# Patient Record
Sex: Female | Born: 1960
Health system: Southern US, Community
[De-identification: ages and names within clinical notes are randomized; demographics above are authoritative.]

## PROBLEM LIST (undated history)

## (undated) DIAGNOSIS — I7300 Raynaud's syndrome without gangrene: Secondary | ICD-10-CM

## (undated) DIAGNOSIS — E079 Disorder of thyroid, unspecified: Secondary | ICD-10-CM

## (undated) DIAGNOSIS — T7840XA Allergy, unspecified, initial encounter: Secondary | ICD-10-CM

## (undated) DIAGNOSIS — K5792 Diverticulitis of intestine, part unspecified, without perforation or abscess without bleeding: Secondary | ICD-10-CM

## (undated) DIAGNOSIS — R7303 Prediabetes: Secondary | ICD-10-CM

## (undated) DIAGNOSIS — K219 Gastro-esophageal reflux disease without esophagitis: Secondary | ICD-10-CM

## (undated) DIAGNOSIS — E785 Hyperlipidemia, unspecified: Secondary | ICD-10-CM

## (undated) DIAGNOSIS — R0989 Other specified symptoms and signs involving the circulatory and respiratory systems: Secondary | ICD-10-CM

## (undated) DIAGNOSIS — A6920 Lyme disease, unspecified: Secondary | ICD-10-CM

## (undated) DIAGNOSIS — E559 Vitamin D deficiency, unspecified: Secondary | ICD-10-CM

## (undated) HISTORY — DX: Prediabetes: R73.03

## (undated) HISTORY — DX: Disorder of thyroid, unspecified: E07.9

## (undated) HISTORY — DX: Raynaud's syndrome without gangrene: I73.00

## (undated) HISTORY — DX: Other specified symptoms and signs involving the circulatory and respiratory systems: R09.89

## (undated) HISTORY — DX: Vitamin D deficiency, unspecified: E55.9

## (undated) HISTORY — DX: Lyme disease, unspecified: A69.20

## (undated) HISTORY — DX: Allergy, unspecified, initial encounter: T78.40XA

## (undated) HISTORY — DX: Hyperlipidemia, unspecified: E78.5

## (undated) HISTORY — DX: Diverticulitis of intestine, part unspecified, without perforation or abscess without bleeding: K57.92

## (undated) HISTORY — DX: Gastro-esophageal reflux disease without esophagitis: K21.9

## (undated) HISTORY — PX: COLONOSCOPY: SHX174

## (undated) HISTORY — PX: TONSILLECTOMY: SUR1361

---

## 1999-06-13 ENCOUNTER — Emergency Department (HOSPITAL_COMMUNITY): Admission: EM | Admit: 1999-06-13 | Discharge: 1999-06-13 | Payer: Self-pay | Admitting: Emergency Medicine

## 1999-06-14 ENCOUNTER — Encounter: Payer: Self-pay | Admitting: Emergency Medicine

## 2000-10-16 ENCOUNTER — Other Ambulatory Visit: Admission: RE | Admit: 2000-10-16 | Discharge: 2000-10-16 | Payer: Self-pay | Admitting: Obstetrics and Gynecology

## 2001-08-29 ENCOUNTER — Emergency Department (HOSPITAL_COMMUNITY): Admission: EM | Admit: 2001-08-29 | Discharge: 2001-08-29 | Payer: Self-pay | Admitting: Emergency Medicine

## 2001-12-03 ENCOUNTER — Other Ambulatory Visit: Admission: RE | Admit: 2001-12-03 | Discharge: 2001-12-03 | Payer: Self-pay | Admitting: Obstetrics and Gynecology

## 2003-03-03 ENCOUNTER — Ambulatory Visit (HOSPITAL_COMMUNITY): Admission: RE | Admit: 2003-03-03 | Discharge: 2003-03-03 | Payer: Self-pay | Admitting: Internal Medicine

## 2003-03-03 ENCOUNTER — Encounter: Payer: Self-pay | Admitting: Internal Medicine

## 2004-03-04 ENCOUNTER — Ambulatory Visit (HOSPITAL_COMMUNITY): Admission: RE | Admit: 2004-03-04 | Discharge: 2004-03-04 | Payer: Self-pay | Admitting: Obstetrics and Gynecology

## 2005-03-05 ENCOUNTER — Ambulatory Visit (HOSPITAL_COMMUNITY): Admission: RE | Admit: 2005-03-05 | Discharge: 2005-03-05 | Payer: Self-pay | Admitting: Internal Medicine

## 2006-03-10 ENCOUNTER — Ambulatory Visit (HOSPITAL_COMMUNITY): Admission: RE | Admit: 2006-03-10 | Discharge: 2006-03-10 | Payer: Self-pay | Admitting: Internal Medicine

## 2006-11-10 ENCOUNTER — Ambulatory Visit (HOSPITAL_COMMUNITY): Admission: RE | Admit: 2006-11-10 | Discharge: 2006-11-10 | Payer: Self-pay | Admitting: Internal Medicine

## 2006-11-25 ENCOUNTER — Ambulatory Visit (HOSPITAL_COMMUNITY): Admission: RE | Admit: 2006-11-25 | Discharge: 2006-11-25 | Payer: Self-pay | Admitting: Internal Medicine

## 2007-03-12 ENCOUNTER — Ambulatory Visit (HOSPITAL_COMMUNITY): Admission: RE | Admit: 2007-03-12 | Discharge: 2007-03-12 | Payer: Self-pay | Admitting: Internal Medicine

## 2008-03-14 ENCOUNTER — Ambulatory Visit (HOSPITAL_COMMUNITY): Admission: RE | Admit: 2008-03-14 | Discharge: 2008-03-14 | Payer: Self-pay | Admitting: Internal Medicine

## 2009-03-15 ENCOUNTER — Ambulatory Visit (HOSPITAL_COMMUNITY): Admission: RE | Admit: 2009-03-15 | Discharge: 2009-03-15 | Payer: Self-pay | Admitting: Internal Medicine

## 2010-03-18 ENCOUNTER — Ambulatory Visit (HOSPITAL_COMMUNITY): Admission: RE | Admit: 2010-03-18 | Discharge: 2010-03-18 | Payer: Self-pay | Admitting: Internal Medicine

## 2011-01-05 ENCOUNTER — Encounter: Payer: Self-pay | Admitting: Internal Medicine

## 2011-02-17 ENCOUNTER — Other Ambulatory Visit (HOSPITAL_COMMUNITY): Payer: Self-pay | Admitting: Internal Medicine

## 2011-02-17 DIAGNOSIS — Z1231 Encounter for screening mammogram for malignant neoplasm of breast: Secondary | ICD-10-CM

## 2011-03-25 ENCOUNTER — Ambulatory Visit (HOSPITAL_COMMUNITY)
Admission: RE | Admit: 2011-03-25 | Discharge: 2011-03-25 | Disposition: A | Discharge status: Home / Self Care | Payer: BC Managed Care – PPO | Source: Ambulatory Visit | Attending: Internal Medicine | Admitting: Internal Medicine

## 2011-03-25 DIAGNOSIS — Z1231 Encounter for screening mammogram for malignant neoplasm of breast: Secondary | ICD-10-CM

## 2012-02-18 ENCOUNTER — Other Ambulatory Visit (HOSPITAL_COMMUNITY): Payer: Self-pay | Admitting: Internal Medicine

## 2012-02-18 DIAGNOSIS — Z1231 Encounter for screening mammogram for malignant neoplasm of breast: Secondary | ICD-10-CM

## 2012-03-30 ENCOUNTER — Ambulatory Visit (HOSPITAL_COMMUNITY)
Admission: RE | Admit: 2012-03-30 | Discharge: 2012-03-30 | Disposition: A | Discharge status: Home / Self Care | Payer: BC Managed Care – PPO | Source: Ambulatory Visit | Attending: Internal Medicine | Admitting: Internal Medicine

## 2012-03-30 DIAGNOSIS — Z1231 Encounter for screening mammogram for malignant neoplasm of breast: Secondary | ICD-10-CM | POA: Insufficient documentation

## 2012-04-12 ENCOUNTER — Encounter: Payer: Self-pay | Admitting: Gastroenterology

## 2012-05-14 ENCOUNTER — Ambulatory Visit (AMBULATORY_SURGERY_CENTER): Payer: BC Managed Care – PPO | Admitting: *Deleted

## 2012-05-14 VITALS — Ht 64.5 in | Wt 140.0 lb

## 2012-05-14 DIAGNOSIS — Z1211 Encounter for screening for malignant neoplasm of colon: Secondary | ICD-10-CM

## 2012-05-14 MED ORDER — PEG-KCL-NACL-NASULF-NA ASC-C 100 G PO SOLR: ORAL | Status: DC

## 2012-05-28 ENCOUNTER — Encounter: Payer: Self-pay | Admitting: Gastroenterology

## 2012-05-28 ENCOUNTER — Ambulatory Visit (AMBULATORY_SURGERY_CENTER): Payer: BC Managed Care – PPO | Admitting: Gastroenterology

## 2012-05-28 VITALS — BP 159/90 | HR 95 | Temp 96.4°F | Resp 16 | Ht 64.5 in | Wt 140.0 lb

## 2012-05-28 DIAGNOSIS — K5730 Diverticulosis of large intestine without perforation or abscess without bleeding: Secondary | ICD-10-CM

## 2012-05-28 DIAGNOSIS — Z1211 Encounter for screening for malignant neoplasm of colon: Secondary | ICD-10-CM

## 2012-05-28 MED ORDER — SODIUM CHLORIDE 0.9 % IV SOLN: 500.0000 mL | INTRAVENOUS | Status: DC

## 2012-05-28 NOTE — Op Note (Signed)
[facility] [address] [place], [place]  [address]  COLONOSCOPY PROCEDURE REPORT  PATIENT:  Beth Miranda, Beth Miranda  MR#:  [id number] BIRTHDATE:  05/18/1961, 51 yrs. old  GENDER:  female ENDOSCOPIST:  Barbette Hair. Arlyce Dice, MD REF. BY:  Lucky Cowboy, M.D. PROCEDURE DATE:  05/28/2012 PROCEDURE:  Diagnostic Colonoscopy ASA CLASS:  Class II INDICATIONS:  Routine Risk Screening MEDICATIONS:   MAC sedation, administered by CRNA propofol 200mg IV  DESCRIPTION OF PROCEDURE:   After the risks benefits and alternatives of the procedure were thoroughly explained, informed consent was obtained.  Digital rectal exam was performed and revealed no abnormalities.   The LB CF-[id number] endoscope was introduced through the anus and advanced to the cecum, which was identified by both the appendix and ileocecal valve, without limitations.  The quality of the prep was excellent, using MoviPrep.  The instrument was then slowly withdrawn as the colon was fully examined. <<PROCEDUREIMAGES>>  FINDINGS:  Diverticula were found in the proximal transverse colon (see image2). Few diverticula  This was otherwise a normal examination of the colon (see image1 and image3).   Retroflexed views in the rectum revealed no abnormalities.    The time to cecum =  1) 4.25  minutes. The scope was then withdrawn in  1) 5.50  minutes from the cecum and the procedure completed. COMPLICATIONS:  None ENDOSCOPIC IMPRESSION: 1) Diverticula in the proximal transverse colon 2) Otherwise normal examination RECOMMENDATIONS: 1) Continue current colorectal screening recommendations for "routine risk" patients with a repeat colonoscopy in 10 years. REPEAT EXAM:  In 10 year(s) for Colonoscopy.  ______________________________ Barbette Hair. Arlyce Dice, MD  CC:  n. eSIGNED:   Barbette Hair. Anyely Cunning at 05/28/2012 12:21 PM  [employer], [phone]

## 2012-05-28 NOTE — Progress Notes (Signed)
Patient did not experience any of the following events: a burn prior to discharge; a fall within the facility; wrong site/side/patient/procedure/implant event; or a hospital transfer or hospital admission upon discharge from the facility. (G8907) Patient did not have preoperative order for IV antibiotic SSI prophylaxis. (G8918)  

## 2012-05-28 NOTE — Patient Instructions (Addendum)
Normal colonoscopy  Recall colonoscopy in 10 years   YOU HAD AN ENDOSCOPIC PROCEDURE TODAY AT THE [facility]: Refer to the procedure report that was given to you for any specific questions about what was found during the examination.  If the procedure report does not answer your questions, please call your gastroenterologist to clarify.  If you requested that your care partner not be given the details of your procedure findings, then the procedure report has been included in a sealed envelope for you to review at your convenience later.  YOU SHOULD EXPECT: Some feelings of bloating in the abdomen. Passage of more gas than usual.  Walking can help get rid of the air that was put into your GI tract during the procedure and reduce the bloating. If you had a lower endoscopy (such as a colonoscopy or flexible sigmoidoscopy) you may notice spotting of blood in your stool or on the toilet paper. If you underwent a bowel prep for your procedure, then you may not have a normal bowel movement for a few days.  DIET: Your first meal following the procedure should be a light meal and then it is ok to progress to your normal diet.  A half-sandwich or bowl of soup is an example of a good first meal.  Heavy or fried foods are harder to digest and may make you feel nauseous or bloated.  Likewise meals heavy in dairy and vegetables can cause extra gas to form and this can also increase the bloating.  Drink plenty of fluids but you should avoid alcoholic beverages for 24 hours.  ACTIVITY: Your care partner should take you home directly after the procedure.  You should plan to take it easy, moving slowly for the rest of the day.  You can resume normal activity the day after the procedure however you should NOT DRIVE or use heavy machinery for 24 hours (because of the sedation medicines used during the test).    SYMPTOMS TO REPORT IMMEDIATELY: A gastroenterologist can be reached at any hour.  During normal  business hours, 8:30 AM to 5:00 PM Monday through Friday, call [phone].  After hours and on weekends, please call the GI answering service at [phone] who will take a message and have the physician on call contact you.   Following lower endoscopy (colonoscopy or flexible sigmoidoscopy):  Excessive amounts of blood in the stool  Significant tenderness or worsening of abdominal pains  Swelling of the abdomen that is new, acute  Fever of 100F or higher  Following upper endoscopy (EGD)  Vomiting of blood or coffee ground material  New chest pain or pain under the shoulder blades  Painful or persistently difficult swallowing  New shortness of breath  Fever of 100F or higher  Black, tarry-looking stools  FOLLOW UP: If any biopsies were taken you will be contacted by phone or by letter within the next 1-3 weeks.  Call your gastroenterologist if you have not heard about the biopsies in 3 weeks.  Our staff will call the home number listed on your records the next business day following your procedure to check on you and address any questions or concerns that you may have at that time regarding the information given to you following your procedure. This is a courtesy call and so if there is no answer at the home number and we have not heard from you through the emergency physician on call, we will assume that you have returned to your regular  daily activities without incident.  SIGNATURES/CONFIDENTIALITY: You and/or your care partner have signed paperwork which will be entered into your electronic medical record.  These signatures attest to the fact that that the information above on your After Visit Summary has been reviewed and is understood.  Full responsibility of the confidentiality of this discharge information lies with you and/or your care-partner. YOU HAD AN ENDOSCOPIC PROCEDURE TODAY AT THE [facility]: Refer to the procedure report that was given to you for any  specific questions about what was found during the examination.  If the procedure report does not answer your questions, please call your gastroenterologist to clarify.  If you requested that your care partner not be given the details of your procedure findings, then the procedure report has been included in a sealed envelope for you to review at your convenience later.  YOU SHOULD EXPECT: Some feelings of bloating in the abdomen. Passage of more gas than usual.  Walking can help get rid of the air that was put into your GI tract during the procedure and reduce the bloating. If you had a lower endoscopy (such as a colonoscopy or flexible sigmoidoscopy) you may notice spotting of blood in your stool or on the toilet paper. If you underwent a bowel prep for your procedure, then you may not have a normal bowel movement for a few days.  DIET: Your first meal following the procedure should be a light meal and then it is ok to progress to your normal diet.  A half-sandwich or bowl of soup is an example of a good first meal.  Heavy or fried foods are harder to digest and may make you feel nauseous or bloated.  Likewise meals heavy in dairy and vegetables can cause extra gas to form and this can also increase the bloating.  Drink plenty of fluids but you should avoid alcoholic beverages for 24 hours.  ACTIVITY: Your care partner should take you home directly after the procedure.  You should plan to take it easy, moving slowly for the rest of the day.  You can resume normal activity the day after the procedure however you should NOT DRIVE or use heavy machinery for 24 hours (because of the sedation medicines used during the test).    SYMPTOMS TO REPORT IMMEDIATELY: A gastroenterologist can be reached at any hour.  During normal business hours, 8:30 AM to 5:00 PM Monday through Friday, call [phone].  After hours and on weekends, please call the GI answering service at [phone] who will take a message and  have the physician on call contact you.   Following lower endoscopy (colonoscopy or flexible sigmoidoscopy):  Excessive amounts of blood in the stool  Significant tenderness or worsening of abdominal pains  Swelling of the abdomen that is new, acute  Fever of 100F or higher  FOLLOW UP: If any biopsies were taken you will be contacted by phone or by letter within the next 1-3 weeks.  Call your gastroenterologist if you have not heard about the biopsies in 3 weeks.  Our staff will call the home number listed on your records the next business day following your procedure to check on you and address any questions or concerns that you may have at that time regarding the information given to you following your procedure. This is a courtesy call and so if there is no answer at the home number and we have not heard from you through the emergency physician on call, we  will assume that you have returned to your regular daily activities without incident.  SIGNATURES/CONFIDENTIALITY: You and/or your care partner have signed paperwork which will be entered into your electronic medical record.  These signatures attest to the fact that that the information above on your After Visit Summary has been reviewed and is understood.  Full responsibility of the confidentiality of this discharge information lies with you and/or your care-partner.

## 2012-05-31 ENCOUNTER — Telehealth: Payer: Self-pay

## 2012-05-31 NOTE — Telephone Encounter (Signed)
Left message on answering machine. 

## 2013-02-28 ENCOUNTER — Other Ambulatory Visit (HOSPITAL_COMMUNITY): Payer: Self-pay | Admitting: Internal Medicine

## 2013-02-28 DIAGNOSIS — Z1231 Encounter for screening mammogram for malignant neoplasm of breast: Secondary | ICD-10-CM

## 2013-03-31 ENCOUNTER — Ambulatory Visit (HOSPITAL_COMMUNITY)
Admission: RE | Admit: 2013-03-31 | Discharge: 2013-03-31 | Disposition: A | Discharge status: Home / Self Care | Payer: BC Managed Care – PPO | Source: Ambulatory Visit | Attending: Internal Medicine | Admitting: Internal Medicine

## 2013-03-31 DIAGNOSIS — Z1231 Encounter for screening mammogram for malignant neoplasm of breast: Secondary | ICD-10-CM | POA: Insufficient documentation

## 2013-12-06 ENCOUNTER — Encounter: Payer: Self-pay | Admitting: Physician Assistant

## 2013-12-06 ENCOUNTER — Ambulatory Visit (INDEPENDENT_AMBULATORY_CARE_PROVIDER_SITE_OTHER): Payer: No Typology Code available for payment source | Admitting: Physician Assistant

## 2013-12-06 VITALS — BP 110/70 | HR 88 | Temp 100.4°F | Resp 16 | Ht 64.5 in | Wt 137.0 lb

## 2013-12-06 DIAGNOSIS — J029 Acute pharyngitis, unspecified: Secondary | ICD-10-CM

## 2013-12-06 LAB — POCT RAPID STREP A (OFFICE): Rapid Strep A Screen: NEGATIVE

## 2013-12-06 MED ORDER — PROMETHAZINE-CODEINE 6.25-10 MG/5ML PO SYRP: 5.0000 mL | ORAL_SOLUTION | Freq: Four times a day (QID) | ORAL | Status: DC | PRN

## 2013-12-06 MED ORDER — PREDNISONE 20 MG PO TABS: ORAL_TABLET | ORAL | Status: DC

## 2013-12-06 MED ORDER — AZITHROMYCIN 250 MG PO TABS: 250.0000 mg | ORAL_TABLET | Freq: Every day | ORAL | Status: DC

## 2013-12-06 NOTE — Patient Instructions (Signed)

## 2013-12-06 NOTE — Progress Notes (Signed)
   Subjective:    Patient ID: Beth Miranda, female    DOB: [birth year], 52 y.o.   MRN: [id number]  URI  This is a new problem. The current episode started in the past 7 days. The problem has been gradually worsening. The maximum temperature recorded prior to her arrival was 100 - 100.9 F. The fever has been present for 1 to 2 days. Associated symptoms include ear pain, a plugged ear sensation, sinus pain, a sore throat and swollen glands. Pertinent negatives include no abdominal pain, chest pain, congestion, coughing, diarrhea, dysuria, headaches, joint pain, joint swelling, nausea, neck pain, rash, rhinorrhea, sneezing, vomiting or wheezing. She has tried nothing for the symptoms.    Review of Systems  Constitutional: Positive for fever, chills and fatigue.  HENT: Positive for ear pain and sore throat. Negative for congestion, rhinorrhea and sneezing.   Eyes: Negative.   Respiratory: Negative.  Negative for cough and wheezing.   Cardiovascular: Negative.  Negative for chest pain.  Gastrointestinal: Negative.  Negative for nausea, vomiting, abdominal pain and diarrhea.  Genitourinary: Negative.  Negative for dysuria.  Musculoskeletal: Negative.  Negative for joint pain and neck pain.  Skin: Negative.  Negative for rash.  Neurological: Negative for headaches.       Objective:   Physical Exam  Constitutional: She appears well-developed and well-nourished.  HENT:  Head: Normocephalic and atraumatic.  Right Ear: External ear normal.  Left Ear: External ear normal.  Mouth/Throat: Uvula is midline and mucous membranes are normal. Posterior oropharyngeal edema and posterior oropharyngeal erythema present.  Eyes: Conjunctivae and EOM are normal. Pupils are equal, round, and reactive to light.  Neck: Normal range of motion. Neck supple.  Cardiovascular: Normal rate, regular rhythm and normal heart sounds.   Pulmonary/Chest: Effort normal and breath sounds normal.  Abdominal: Soft. Bowel  sounds are normal.  Lymphadenopathy:    She has cervical adenopathy.  Skin: Skin is warm and dry.       Assessment & Plan:  Acute pharyngitis - Plan: azithromycin (ZITHROMAX) 250 MG tablet, predniSONE (DELTASONE) 20 MG tablet, promethazine-codeine (PHENERGAN WITH CODEINE) 6.25-10 MG/5ML syrup, POCT rapid strep A- negative

## 2014-04-24 ENCOUNTER — Other Ambulatory Visit: Payer: Self-pay | Admitting: Internal Medicine

## 2014-04-24 MED ORDER — LEVOTHYROXINE SODIUM 50 MCG PO TABS: 50.0000 ug | ORAL_TABLET | Freq: Every day | ORAL | Status: DC

## 2014-04-25 ENCOUNTER — Encounter: Payer: Self-pay | Admitting: Internal Medicine

## 2014-05-03 ENCOUNTER — Other Ambulatory Visit: Payer: Self-pay | Admitting: *Deleted

## 2014-05-03 MED ORDER — RANITIDINE HCL 300 MG PO TABS: 300.0000 mg | ORAL_TABLET | Freq: Every day | ORAL | Status: DC

## 2014-05-08 DIAGNOSIS — I7300 Raynaud's syndrome without gangrene: Secondary | ICD-10-CM | POA: Insufficient documentation

## 2014-05-08 DIAGNOSIS — R0989 Other specified symptoms and signs involving the circulatory and respiratory systems: Secondary | ICD-10-CM | POA: Insufficient documentation

## 2014-05-08 DIAGNOSIS — E782 Mixed hyperlipidemia: Secondary | ICD-10-CM | POA: Insufficient documentation

## 2014-05-08 DIAGNOSIS — R7309 Other abnormal glucose: Secondary | ICD-10-CM | POA: Insufficient documentation

## 2014-05-08 DIAGNOSIS — K219 Gastro-esophageal reflux disease without esophagitis: Secondary | ICD-10-CM | POA: Insufficient documentation

## 2014-05-08 DIAGNOSIS — E559 Vitamin D deficiency, unspecified: Secondary | ICD-10-CM | POA: Insufficient documentation

## 2014-05-09 ENCOUNTER — Encounter: Payer: Self-pay | Admitting: Internal Medicine

## 2014-05-09 ENCOUNTER — Ambulatory Visit (INDEPENDENT_AMBULATORY_CARE_PROVIDER_SITE_OTHER): Payer: No Typology Code available for payment source | Admitting: Internal Medicine

## 2014-05-09 VITALS — BP 104/76 | HR 76 | Temp 97.7°F | Resp 16 | Ht 64.25 in | Wt 135.8 lb

## 2014-05-09 DIAGNOSIS — E559 Vitamin D deficiency, unspecified: Secondary | ICD-10-CM

## 2014-05-09 DIAGNOSIS — R7401 Elevation of levels of liver transaminase levels: Secondary | ICD-10-CM

## 2014-05-09 DIAGNOSIS — R7402 Elevation of levels of lactic acid dehydrogenase (LDH): Secondary | ICD-10-CM

## 2014-05-09 DIAGNOSIS — Z113 Encounter for screening for infections with a predominantly sexual mode of transmission: Secondary | ICD-10-CM

## 2014-05-09 DIAGNOSIS — Z111 Encounter for screening for respiratory tuberculosis: Secondary | ICD-10-CM

## 2014-05-09 DIAGNOSIS — R0989 Other specified symptoms and signs involving the circulatory and respiratory systems: Secondary | ICD-10-CM

## 2014-05-09 DIAGNOSIS — Z1212 Encounter for screening for malignant neoplasm of rectum: Secondary | ICD-10-CM

## 2014-05-09 DIAGNOSIS — R740 Nonspecific elevation of levels of transaminase and lactic acid dehydrogenase [LDH]: Secondary | ICD-10-CM

## 2014-05-09 DIAGNOSIS — Z0000 Encounter for general adult medical examination without abnormal findings: Secondary | ICD-10-CM

## 2014-05-09 DIAGNOSIS — I10 Essential (primary) hypertension: Secondary | ICD-10-CM

## 2014-05-09 DIAGNOSIS — E039 Hypothyroidism, unspecified: Secondary | ICD-10-CM | POA: Insufficient documentation

## 2014-05-09 LAB — CBC WITH DIFFERENTIAL/PLATELET
Basophils Absolute: 0 10*3/uL (ref 0.0–0.1)
Basophils Relative: 1 % (ref 0–1)
Eosinophils Absolute: 0 10*3/uL (ref 0.0–0.7)
Eosinophils Relative: 1 % (ref 0–5)
HCT: 38.6 % (ref 36.0–46.0)
Hemoglobin: 13.1 g/dL (ref 12.0–15.0)
Lymphocytes Relative: 41 % (ref 12–46)
Lymphs Abs: 1.6 10*3/uL (ref 0.7–4.0)
MCH: 29.3 pg (ref 26.0–34.0)
MCHC: 33.9 g/dL (ref 30.0–36.0)
MCV: 86.4 fL (ref 78.0–100.0)
Monocytes Absolute: 0.3 10*3/uL (ref 0.1–1.0)
Monocytes Relative: 8 % (ref 3–12)
Neutro Abs: 2 10*3/uL (ref 1.7–7.7)
Neutrophils Relative %: 49 % (ref 43–77)
Platelets: 230 10*3/uL (ref 150–400)
RBC: 4.47 MIL/uL (ref 3.87–5.11)
RDW: 13.4 % (ref 11.5–15.5)
WBC: 4 10*3/uL (ref 4.0–10.5)

## 2014-05-09 NOTE — Progress Notes (Signed)
Patient ID: Beth Miranda, female   DOB: 1961-03-07, 53 y.o.   MRN: [id number]   Annual Screening Comprehensive Examination  This very nice 53 y.o. MWF presents for complete physical.  Patient has been followed for HTN, Prediabetes, Hyperlipidemia, and Vitamin D Deficiency.    Patient has Hx/o labile HTN which has been monitored expectantly.  Patient's BP has been controlled and today's BP: 104/76 mmHg. Patient denies any cardiac symptoms as chest pain, palpitations, shortness of breath, dizziness or ankle swelling.   Patient's hyperlipidemia is controlled with diet and medications. Patient denies myalgias or other medication SE's. Last cholesterol last visit was 187, triglycerides 70, HDL 70 and LDL 103 in Oct 2014.     Patient has prediabetes with A1c 5.9%  predating since 2012 and last A1c was 5.8% in Oct 2014. Patient denies reactive hypoglycemic symptoms, visual blurring, diabetic polys, or paresthesias.    Finally, patient has history of Vitamin D Deficiency and last vitamin D was 76 in Oct 2014.  Medication Sig  . aspirin 81 MG tablet Take 81 mg by mouth daily.  . Cetirizine HCl  Take 10 mg by mouth at bedtime.  Marland Kitchen VITAMIN D 2000 UNITS t Take 2,000 Units by mouth 2 (two) times daily.  Marland Kitchen VITAMIN B 12 PO Take 1,000 mcg by mouth daily.  Marland Kitchen estradiol-norethindrone (ACTIVELLA) 0.5/0.1 mg Take 1 tablet by mouth at bedtime.  . fish oil 1000 MG cap Take 1,200 mg by mouth 2 (two) times daily.  . hyoscyamine  0.15 MG  Take by mouth daily.  . Levothyroxine 50 MCG tablet Take 1 tablet (50 mcg total) by mouth daily.  . Magnesium 200 MG TABS Take 200 mg by mouth 2 (two) times daily.  Marland Kitchen omeprazole  40 MG cap Take 40 mg by mouth daily.  . ranitidine  300 MG tablet Take 1 tablet (300 mg total) by mouth at bedtime.  . Red Yeast Rice Extract  Take 1,200 mg by mouth daily.   Allergies  Allergen Reactions  . Lamisil [Terbinafine Hcl] Hives  . Penicillins Hives  . Sulfa Antibiotics Hives   Past  Medical History  Diagnosis Date  . Raynaud's disease   . Labile hypertension   . GERD (gastroesophageal reflux disease)   . Thyroid disease     hypothyroid  . Allergy     seasonal  . Hyperlipidemia   . Prediabetes   . Vitamin D deficiency    Past Surgical History  Procedure Laterality Date  . Tonsillectomy     Family History  Problem Relation Age of Onset  . Pancreatic cancer Paternal Grandmother   . Arthritis Mother   . COPD Mother   . Asthma Mother   . Hypertension Father   . Diabetes Father   . Kidney disease Father     Stones   History  Substance Use Topics  . Smoking status: Never Smoker   . Smokeless tobacco: Never Used  . Alcohol Use: 0.5 oz/week    1 drink(s) per week    ROS Constitutional: Denies fever, chills, weight loss/gain, headaches, insomnia, fatigue, night sweats, and change in appetite. Eyes: Denies redness, blurred vision, diplopia, discharge, itchy, watery eyes.  ENT: Denies discharge, congestion, post nasal drip, epistaxis, sore throat, earache, hearing loss, dental pain, Tinnitus, Vertigo, Sinus pain, snoring.  Cardio: Denies chest pain, palpitations, irregular heartbeat, syncope, dyspnea, diaphoresis, orthopnea, PND, claudication, edema Respiratory: denies cough, dyspnea, DOE, pleurisy, hoarseness, laryngitis, wheezing.  Gastrointestinal: Denies dysphagia, heartburn, reflux, water brash, pain,  cramps, nausea, vomiting, bloating, diarrhea, constipation, hematemesis, melena, hematochezia, jaundice, hemorrhoids Genitourinary: Denies dysuria, frequency, urgency, nocturia, hesitancy, discharge, hematuria, flank pain Breast:Breast lumps, nipple discharge, bleeding.  Musculoskeletal: Denies arthralgia, myalgia, stiffness, Jt. Swelling, pain, limp, and strain/sprain. Skin: Denies puritis, rash, hives, warts, acne, eczema, changing in skin lesion Neuro: No weakness, tremor, incoordination, spasms, paresthesia, pain Psychiatric: Denies confusion, memory  loss, sensory loss Endocrine: Denies change in weight, skin, hair change, nocturia, and paresthesia, diabetic polys, visual blurring, hyper / hypo glycemic episodes.  Heme/Lymph: No excessive bleeding, bruising, enlarged lymph nodes.   Physical Exam  BP 104/76  Pulse 76  Temp 97.7 F   Resp 16  Ht 5' 4.25"   Wt 135 lb 12.8 oz   BMI 23.13 kg/m2  General Appearance: Well nourished, in no apparent distress. Eyes: PERRLA, EOMs, conjunctiva no swelling or erythema, normal fundi and vessels. Sinuses: No frontal/maxillary tenderness ENT/Mouth: EACs patent / TMs  nl. Nares clear without erythema, swelling, mucoid exudates. Oral hygiene is good. No erythema, swelling, or exudate. Tongue normal, non-obstructing. Tonsils not swollen or erythematous. Hearing normal.  Neck: Supple, thyroid normal. No bruits, nodes or JVD. Respiratory: Respiratory effort normal.  BS equal and clear bilateral without rales, rhonci, wheezing or stridor. Cardio: Heart sounds are normal with regular rate and rhythm and no murmurs, rubs or gallops. Peripheral pulses are normal and equal bilaterally without edema. No aortic or femoral bruits. Chest: symmetric with normal excursions and percussion. Breasts: Symmetric, without lumps, nipple discharge, retractions, or fibrocystic changes.  Abdomen: Flat, soft, with bowl sounds. Nontender, no guarding, rebound, hernias, masses, or organomegaly.  Lymphatics: Non tender without lymphadenopathy.  Musculoskeletal: Full ROM all peripheral extremities, joint stability, 5/5 strength, and normal gait. Skin: Warm and dry without rashes, lesions, cyanosis, clubbing or  ecchymosis.  Neuro: Cranial nerves intact, reflexes equal bilaterally. Normal muscle tone, no cerebellar symptoms. Sensation intact.  Pysch: Awake and oriented X 3, normal affect, Insight and Judgment appropriate.   Assessment and Plan  1. Annual Screening Examination 2. Hypertension, Labile 3. Headaches, Mixed 4.  Pre Diabetes 5. Hypothyroidism 6. Hyperlipidemia 7. Raynaud's 8. MCTD Syndrome , remission  Continue prudent diet as discussed, weight control, BP monitoring, regular exercise, and medications. Discussed med's effects and SE's. Screening labs and tests as requested with regular follow-up as recommended.

## 2014-05-09 NOTE — Patient Instructions (Signed)

## 2014-05-10 LAB — IRON AND TIBC
%SAT: 23 % (ref 20–55)
Iron: 69 ug/dL (ref 42–145)
TIBC: 294 ug/dL (ref 250–470)
UIBC: 225 ug/dL (ref 125–400)

## 2014-05-10 LAB — HEMOGLOBIN A1C
Hgb A1c MFr Bld: 5.4 % (ref <5.7)
Mean Plasma Glucose: 108 mg/dL (ref <117)

## 2014-05-10 LAB — LIPID PANEL
Cholesterol: 157 mg/dL (ref 0–200)
HDL: 59 mg/dL (ref >39)
LDL Cholesterol: 89 mg/dL (ref 0–99)
Total CHOL/HDL Ratio: 2.7 Ratio
Triglycerides: 43 mg/dL (ref <150)
VLDL: 9 mg/dL (ref 0–40)

## 2014-05-10 LAB — MAGNESIUM: Magnesium: 2.2 mg/dL (ref 1.5–2.5)

## 2014-05-10 LAB — URINALYSIS, MICROSCOPIC ONLY
Bacteria, UA: NONE SEEN
Casts: NONE SEEN
Crystals: NONE SEEN
Squamous Epithelial / LPF: NONE SEEN

## 2014-05-10 LAB — VITAMIN D 25 HYDROXY (VIT D DEFICIENCY, FRACTURES): Vit D, 25-Hydroxy: 68 ng/mL (ref 30–89)

## 2014-05-10 LAB — HEPATIC FUNCTION PANEL
ALT: 9 U/L (ref 0–35)
AST: 17 U/L (ref 0–37)
Albumin: 4.4 g/dL (ref 3.5–5.2)
Alkaline Phosphatase: 28 U/L — ABNORMAL LOW (ref 39–117)
Bilirubin, Direct: 0.1 mg/dL (ref 0.0–0.3)
Indirect Bilirubin: 0.3 mg/dL (ref 0.2–1.2)
Total Bilirubin: 0.4 mg/dL (ref 0.2–1.2)
Total Protein: 6.8 g/dL (ref 6.0–8.3)

## 2014-05-10 LAB — BASIC METABOLIC PANEL WITH GFR
BUN: 13 mg/dL (ref 6–23)
CO2: 28 mEq/L (ref 19–32)
Calcium: 9.2 mg/dL (ref 8.4–10.5)
Chloride: 106 mEq/L (ref 96–112)
Creat: 0.88 mg/dL (ref 0.50–1.10)
GFR, Est African American: 87 mL/min
GFR, Est Non African American: 76 mL/min
Glucose, Bld: 81 mg/dL (ref 70–99)
Potassium: 4.6 mEq/L (ref 3.5–5.3)
Sodium: 142 mEq/L (ref 135–145)

## 2014-05-10 LAB — MICROALBUMIN / CREATININE URINE RATIO
Creatinine, Urine: 27 mg/dL
Microalb Creat Ratio: 18.5 mg/g (ref 0.0–30.0)
Microalb, Ur: 0.5 mg/dL (ref 0.00–1.89)

## 2014-05-10 LAB — HEPATITIS A ANTIBODY, TOTAL: Hep A Total Ab: NONREACTIVE

## 2014-05-10 LAB — HEPATITIS B CORE ANTIBODY, TOTAL: Hep B Core Total Ab: NONREACTIVE

## 2014-05-10 LAB — HEPATITIS B SURFACE ANTIBODY,QUALITATIVE

## 2014-05-10 LAB — RPR

## 2014-05-10 LAB — HEPATITIS C ANTIBODY: HCV Ab: NEGATIVE

## 2014-05-10 LAB — VITAMIN B12: Vitamin B-12: 653 pg/mL (ref 211–911)

## 2014-05-10 LAB — INSULIN, FASTING: Insulin fasting, serum: 5 u[IU]/mL (ref 3–28)

## 2014-05-10 LAB — HIV ANTIBODY (ROUTINE TESTING W REFLEX): HIV 1&2 Ab, 4th Generation: NONREACTIVE

## 2014-05-10 LAB — TSH: TSH: 1.876 u[IU]/mL (ref 0.350–4.500)

## 2014-05-11 LAB — HEPATITIS B E ANTIBODY: Hepatitis Be Antibody: NONREACTIVE

## 2014-05-17 LAB — TB SKIN TEST
Induration: 0 mm
TB Skin Test: NEGATIVE

## 2014-05-24 ENCOUNTER — Other Ambulatory Visit (INDEPENDENT_AMBULATORY_CARE_PROVIDER_SITE_OTHER): Payer: No Typology Code available for payment source | Admitting: *Deleted

## 2014-05-24 DIAGNOSIS — Z1212 Encounter for screening for malignant neoplasm of rectum: Secondary | ICD-10-CM

## 2014-05-24 LAB — POC HEMOCCULT BLD/STL (HOME/3-CARD/SCREEN)
Card #2 Fecal Occult Blod, POC: NEGATIVE
Card #3 Fecal Occult Blood, POC: NEGATIVE
Fecal Occult Blood, POC: NEGATIVE

## 2014-11-14 ENCOUNTER — Ambulatory Visit (INDEPENDENT_AMBULATORY_CARE_PROVIDER_SITE_OTHER): Payer: No Typology Code available for payment source | Admitting: Internal Medicine

## 2014-11-14 ENCOUNTER — Encounter: Payer: Self-pay | Admitting: Internal Medicine

## 2014-11-14 VITALS — BP 120/82 | HR 80 | Temp 97.9°F | Resp 16 | Ht 64.5 in | Wt 143.2 lb

## 2014-11-14 DIAGNOSIS — Z79899 Other long term (current) drug therapy: Secondary | ICD-10-CM

## 2014-11-14 DIAGNOSIS — R7303 Prediabetes: Secondary | ICD-10-CM

## 2014-11-14 DIAGNOSIS — R202 Paresthesia of skin: Secondary | ICD-10-CM

## 2014-11-14 DIAGNOSIS — R7309 Other abnormal glucose: Secondary | ICD-10-CM

## 2014-11-14 NOTE — Patient Instructions (Signed)

## 2014-11-14 NOTE — Progress Notes (Signed)
Subjective:    Patient ID: Beth Miranda, female    DOB: 12-Sep-1961, 53 y.o.   MRN: [id number]  HPI  Patient presents with 1 month hx/ intermittent numbness of the toes (Rt 2 - 3 - 4 & Lt 2 -3). Has hx/o prediabetes predating to Oct 2012 with A1c of 5.9%. No known hx/o DDD or HNP or sx/o sciatica. Denies bowel or bladder issues or sx's. No k/o heavy metal or toxic exposures to pesticides/herbacides,  tick bites, etc. Has remote hx/o MCTD syndrome in remission 23 yrs (1992) other than typically triggered raynaud's syndrome. No unusual skin rashes, myalgias or arthralgias.   Medication Sig  . aspirin 81 MG tablet Take 81 mg by mouth daily.  . Cetirizine HCl (ZYRTEC ALLERGY PO) Take 10 mg by mouth at bedtime.  . Cholecalciferol (VITAMIN D) 2000 UNITS tablet Take 2,000 Units by mouth 2 (two) times daily.  . Cyanocobalamin (VITAMIN B 12 PO) Take 1,000 mcg by mouth daily.  Marland Kitchen estradiol-norethindrone (ACTIVELLA) 1-0.5 MG per tablet Take 1 tablet by mouth at bedtime.  Marland Kitchen etodolac (LODINE) 400 MG tablet   . fish oil-omega-3 fatty acids 1000 MG capsule Take 1,200 mg by mouth 2 (two) times daily.  . hyoscyamine (CYSTOSPAZ) 0.15 MG tablet Take by mouth daily.  . hyoscyamine (LEVBID) 0.375 MG 12 hr tablet   . levothyroxine (SYNTHROID, LEVOTHROID) 50 MCG tablet Take 1 tablet (50 mcg total) by mouth daily.  . Magnesium 200 MG TABS Take 200 mg by mouth 2 (two) times daily.  Marland Kitchen omeprazole (PRILOSEC) 40 MG capsule Take 40 mg by mouth daily.  . ranitidine (ZANTAC) 300 MG tablet Take 1 tablet (300 mg total) by mouth at bedtime.  . Red Yeast Rice Extract (RED YEAST RICE PO) Take 1,200 mg by mouth daily.   Allergies  Allergen Reactions  . Lamisil [Terbinafine Hcl] Hives  . Penicillins Hives  . Sulfa Antibiotics Hives   Past Medical History  Diagnosis Date  . Raynaud's disease   . Labile hypertension   . GERD (gastroesophageal reflux disease)   . Thyroid disease     hypothyroid  . Allergy     seasonal   . Hyperlipidemia   . Prediabetes   . Vitamin D deficiency    Review of Systems In addition to the HPI above,  No Fever-chills,  No Headache, No changes with Vision or hearing,  No problems swallowing food or Liquids,  No Chest pain or productive Cough or Shortness of Breath,  No Abdominal pain, No Nausea or Vomitting, Bowel movements are regular,  No Blood in stool or Urine,  No dysuria,  No new skin rashes or bruises,  No new joints pains-aches,  No new weakness, tingling, numbness in any extremity,  No recent weight loss,  No polyuria, polydypsia or polyphagia,  No significant Mental Stressors.  A full 10 point Review of Systems was done, except as stated above, all other Review of Systems were negative     Objective:   Physical Exam    BP 120/82   P 80  T 97.9 F   R 16  Ht 5' 4.5"  Wt 143 lb 3.2 oz   BMI 24.21   HEENT - Eac's patent. TM's Nl. EOM's full. PERRLA. NasoOroPharynx clear. Neck - supple. Nl Thyroid. Carotids 2+ & No bruits, nodes, JVD Chest - Clear equal BS w/o Rales, rhonchi, wheezes. Cor - Nl HS. RRR w/o sig MGR. PP 1(+). No edema. Abd - Soft. Benign. MS- FROM  w/o deformities. Muscle power, tone and bulk Nl. Gait Nl. Neuro - No obvious Cr N abnormalities. Motor and Cerebellar functions appear Nl w/o focal abnormalities. Sl decrease to mono-filament testing in a distal foot stocking distribution bilat. DTR's of LE's Nl/Equal.  Psyche - Mental status normal & appropriate.  No delusions, ideations or obvious mood abnormalities.    Assessment & Plan:   1. Prediabetes - Hemoglobin A1c - Fructosamine   3. Paresthesia of both feet  - discussed with patient to defer expensive MRI testing unless develops Sx's suggesting spinal cord/canal compression.

## 2014-11-15 LAB — HEMOGLOBIN A1C
Hgb A1c MFr Bld: 5.8 % — ABNORMAL HIGH (ref <5.7)
Mean Plasma Glucose: 120 mg/dL — ABNORMAL HIGH (ref <117)

## 2014-11-17 LAB — FRUCTOSAMINE: Fructosamine: 240 umol/L (ref 190–270)

## 2014-12-13 ENCOUNTER — Other Ambulatory Visit: Payer: Self-pay | Admitting: Internal Medicine

## 2014-12-13 DIAGNOSIS — M5442 Lumbago with sciatica, left side: Principal | ICD-10-CM

## 2014-12-13 DIAGNOSIS — M5441 Lumbago with sciatica, right side: Secondary | ICD-10-CM

## 2015-01-01 ENCOUNTER — Ambulatory Visit
Admission: RE | Admit: 2015-01-01 | Discharge: 2015-01-01 | Disposition: A | Discharge status: Home / Self Care | Payer: No Typology Code available for payment source | Source: Ambulatory Visit | Attending: Internal Medicine | Admitting: Internal Medicine

## 2015-01-01 DIAGNOSIS — M5441 Lumbago with sciatica, right side: Secondary | ICD-10-CM

## 2015-01-01 DIAGNOSIS — M5442 Lumbago with sciatica, left side: Principal | ICD-10-CM

## 2015-01-09 ENCOUNTER — Encounter: Payer: Self-pay | Admitting: Physician Assistant

## 2015-01-09 ENCOUNTER — Ambulatory Visit: Payer: Self-pay | Admitting: Internal Medicine

## 2015-01-09 ENCOUNTER — Ambulatory Visit (INDEPENDENT_AMBULATORY_CARE_PROVIDER_SITE_OTHER): Payer: No Typology Code available for payment source | Admitting: Internal Medicine

## 2015-01-09 VITALS — BP 124/70 | HR 78 | Temp 98.4°F | Resp 16 | Ht 64.5 in | Wt 144.0 lb

## 2015-01-09 DIAGNOSIS — M545 Low back pain: Secondary | ICD-10-CM

## 2015-01-09 DIAGNOSIS — R202 Paresthesia of skin: Secondary | ICD-10-CM

## 2015-01-09 MED ORDER — GABAPENTIN 300 MG PO CAPS: ORAL_CAPSULE | ORAL | Status: DC

## 2015-01-09 MED ORDER — TIZANIDINE HCL 4 MG PO TABS: ORAL_TABLET | ORAL | Status: DC

## 2015-01-09 NOTE — Progress Notes (Signed)
   Subjective:    Patient ID: Beth Miranda, female    DOB: 1961-07-24, 54 y.o.   MRN: [id number]  HPI Patuient returns today for f/u of c/o numb paresthesias of toes and had screening Nl B12 level and borderline A1c 5.8%. Subsequent lumbar MRI found no significant abnormalities. Today sh's also c/o mid and low back pains w/o any sciatica sx's.  Medication Sig  . aspirin 81 MG tablet Take 81 mg by mouth daily.  . Cetirizine HCl (ZYRTEC ALLERGY PO) Take 10 mg by mouth at bedtime.  . Cholecalciferol (VITAMIN D) 2000 UNITS tablet Take 2,000 Units by mouth 2 (two) times daily.  . Cyanocobalamin (VITAMIN B 12 PO) Take 1,000 mcg by mouth daily.  Marland Kitchen estradiol-norethindrone (ACTIVELLA) 1-0.5 MG  Take 1 tablet by mouth at bedtime.  Marland Kitchen etodolac (LODINE) 400 MG tablet   . fish oil-omega-3 fatty acids 1000 MG capsule Take 1,200 mg by mouth 2 (two) times daily.  Marland Kitchen levothyroxine  50 MCG tablet Take 1 tablet (50 mcg total) by mouth daily.  . Magnesium 200 MG TABS Take 200 mg by mouth 2 (two) times daily.  Marland Kitchen omeprazole (PRILOSEC) 40 MG capsule Take 40 mg by mouth daily.  . Red Yeast Rice Extract (RED YEAST RICE PO) Take 1,200 mg by mouth daily.  . hyoscyamine (CYSTOSPAZ) 0.15 MG tablet Take by mouth daily.  . hyoscyamine (LEVBID) 0.375 MG 12 hr tablet   . ranitidine (ZANTAC) 300 MG tablet Take 1 tablet (300 mg total) by mouth at bedtime. (Patient not taking: Reported on 01/09/2015)   Allergies  Allergen Reactions  . Lamisil [Terbinafine Hcl] Hives  . Penicillins Hives  . Sulfa Antibiotics Hives   Past Medical History  Diagnosis Date  . Raynaud's disease   . Labile hypertension   . GERD (gastroesophageal reflux disease)   . Thyroid disease     hypothyroid  . Allergy     seasonal  . Hyperlipidemia   . Prediabetes   . Vitamin D deficiency    Review of Systems neg 10  Pt review except as above.     Objective:   Physical Exam BP 124/70 mmHg  Pulse 78  Temp(Src) 98.4 F (36.9 C) (Temporal)   Resp 16  Ht 5' 4.5" (1.638 m)  Wt 144 lb (65.318 kg)  BMI 24.34 kg/m2  LMP 03/02/2013  HEENT - Eac's patent. TM's Nl. EOM's full. PERRLA. NasoOroPharynx clear. Neck - supple. Nl Thyroid. Carotids 2+ & No bruits, nodes, JVD Chest - Clear equal BS w/o Rales, rhonchi, wheezes. Cor - Nl HS. RRR w/o sig MGR. PP 1(+). No edema. Abd - No palpable organomegaly, masses or tenderness. BS nl. MS- Neg SLR bilat. Bilateral mid to low mild tender para lumbar spasm. FROM w/o deformities. Muscle power, tone and bulk Nl. Gait Nl. Neuro - No obvious Cr N abnormalities. Sensory, motor and Cerebellar functions appear Nl w/o focal abnormalities. Psyche - Mental status normal & appropriate.  No delusions, ideations or obvious mood abnormalities.    Assessment & Plan:   1. Low back pain w/o sciatica  - gabapentin (NEURONTIN) 300 MG capsule; Take 1 tablet 3 x day for pain  Dispense: 90 capsule; Refill: 2 - tiZANidine (ZANAFLEX) 4 MG tablet; Take 1/2 to 1 tablet 3  x day - muscle spasms  Dispense: 90 tablet; Refill: 2  2. Paresthesia of both feet  - Heavy metals screen, urine

## 2015-01-09 NOTE — Progress Notes (Deleted)
HPI  A    54 y.o.female presents to the office today due to   [place] T- S-  Past Surgical History  Procedure Laterality Date  . Tonsillectomy     Review of Systems- ROS  Past Medical History-  Past Medical History  Diagnosis Date  . Raynaud's disease   . Labile hypertension   . GERD (gastroesophageal reflux disease)   . Thyroid disease     hypothyroid  . Allergy     seasonal  . Hyperlipidemia   . Prediabetes   . Vitamin D deficiency    Medications-  Current Outpatient Prescriptions on File Prior to Visit  Medication Sig Dispense Refill  . aspirin 81 MG tablet Take 81 mg by mouth daily.    . Cetirizine HCl (ZYRTEC ALLERGY PO) Take 10 mg by mouth at bedtime.    . Cholecalciferol (VITAMIN D) 2000 UNITS tablet Take 2,000 Units by mouth 2 (two) times daily.    . Cyanocobalamin (VITAMIN B 12 PO) Take 1,000 mcg by mouth daily.    Marland Kitchen estradiol-norethindrone (ACTIVELLA) 1-0.5 MG per tablet Take 1 tablet by mouth at bedtime.    Marland Kitchen etodolac (LODINE) 400 MG tablet     . fish oil-omega-3 fatty acids 1000 MG capsule Take 1,200 mg by mouth 2 (two) times daily.    Marland Kitchen levothyroxine (SYNTHROID, LEVOTHROID) 50 MCG tablet Take 1 tablet (50 mcg total) by mouth daily. 90 tablet 99  . Magnesium 200 MG TABS Take 200 mg by mouth 2 (two) times daily.    Marland Kitchen omeprazole (PRILOSEC) 40 MG capsule Take 40 mg by mouth daily.    . Red Yeast Rice Extract (RED YEAST RICE PO) Take 1,200 mg by mouth daily.     No current facility-administered medications on file prior to visit.   Allergies-  Allergies  Allergen Reactions  . Lamisil [Terbinafine Hcl] Hives  . Penicillins Hives  . Sulfa Antibiotics Hives    Physical Exam BP 124/70 mmHg  Pulse 78  Temp(Src) 98.4 F (36.9 C) (Temporal)  Resp 16  Ht 5' 4.5" (1.638 m)  Wt 144 lb (65.318 kg)  BMI 24.34 kg/m2  LMP 03/02/2013 Wt Readings from Last 3 Encounters:  01/09/15 144 lb (65.318 kg)  11/14/14 143 lb 3.2 oz (64.955 kg)   05/09/14 135 lb 12.8 oz (61.598 kg)   General Appearance: Well nourished, in no apparent distress and had pleasant demeanor. Eyes:  PERRLA. EOMI. Conjunctiva is pink without edema, erythema or yellowing.   Sinuses: No Frontal/maxillary tenderness Ears: No erythema, edema or tenderness on both external ear cartilages and ear canals.  TMs are intact bilaterally with normal light reflexes and without erythema,  edema, or bulging. Nose: Nose is symmetrical and turbinates are pink and not erythematous, edematous or  pale.  No polyps, tenderness or rhinorrhea. Throat: Oral pharynx is pink and moist.  No erythema, edema or tenderness in pharynx or posterior pharynx Mucosa is intact and without lesions.  Tonsils are at +2 station bilaterally and do not have exudate.    Uvula is midline and not swollen. Neck: Supple,  JVD,  carotid bruits,  LAD,  thyromegaly or thyroid masses Trachea is midline.  Full range of motion in neck intact Respiratory: Chest wall expansion symmetrical.  CTAB,  r/r/w,  tactile fremitus, nl percussion. No stridor.  No increased effort of breathing. Cardio: RRR.   m/r/g.  S1S2nl.  PMI non-displaced.  Abdomen: Symmetrical, soft, nontender, and flat.  +BS nl x4.  No bruits heard in aorta and femoral arteries.     Resonant to percussion.  No masses, hernias or pulsatile masses.    Inferior liver border palpable and no pain during deep breathe.    Rebound.  Guarding.  Negative Murphy's, Negative Rovsing's, Negative McBurney's, Negative  Psoas, Negative Obturator. Extremities:  C/C/E in upper and lower extremities. Pulses B/L +2   Musculoskeletal: Range of motion and strength intact bilaterally in upper and lower  extremities, which includes ankle, knee, hip, neck, shoulders, elbows, wrist and  fingers.   No pain in any joints.   Back flexion, extension, twisting, and bending side to side are intact.  No kyphosis, lordosis, scoliosis. Skin: Warm, dry, intact  without rashes, lesions, ecchymosis, yellowing, cyanosis.   Skin has good turgor.    Capillary refill equal bilaterally in hands and feet.   Neuro: Alert and oriented X3, cooperative.  Mood and affect appropriate to situation.  CN II-XII grossly intact.  Sensation intact.  DTRs 2+ throughout. Psych: Insight and Judgment appropriate.   Assessment and Plan  There are no diagnoses linked to this encounter.    Discussed medication effects and SE's.  Pt agreed to treatment plan. If you are not feeling better in 10-14 days, then please call the office. Please follow up in 3 months. Please keep your follow up/physical appt on ***.  Bernerd Terhune, Stephani Police, PA-C 10:15 AM [facility]

## 2015-01-09 NOTE — Patient Instructions (Signed)
Back Pain, Adult Low back pain is very common. About 1 in 5 people have back pain.The cause of low back pain is rarely dangerous. The pain often gets better over time.About half of people with a sudden onset of back pain feel better in just 2 weeks. About 8 in 10 people feel better by 6 weeks.  CAUSES Some common causes of back pain include:  Strain of the muscles or ligaments supporting the spine.  Wear and tear (degeneration) of the spinal discs.  Arthritis.  Direct injury to the back. DIAGNOSIS Most of the time, the direct cause of low back pain is not known.However, back pain can be treated effectively even when the exact cause of the pain is unknown.Answering your caregiver's questions about your overall health and symptoms is one of the most accurate ways to make sure the cause of your pain is not dangerous. If your caregiver needs more information, he or she may order lab work or imaging tests (X-rays or MRIs).However, even if imaging tests show changes in your back, this usually does not require surgery. HOME CARE INSTRUCTIONS For many people, back pain returns.Since low back pain is rarely dangerous, it is often a condition that people can learn to manageon their own.   Remain active. It is stressful on the back to sit or stand in one place. Do not sit, drive, or stand in one place for more than 30 minutes at a time. Take short walks on level surfaces as soon as pain allows.Try to increase the length of time you walk each day.  Do not stay in bed.Resting more than 1 or 2 days can delay your recovery.  Do not avoid exercise or work.Your body is made to move.It is not dangerous to be active, even though your back may hurt.Your back will likely heal faster if you return to being active before your pain is gone.  Pay attention to your body when you bend and lift. Many people have less discomfortwhen lifting if they bend their knees, keep the load close to their bodies,and  avoid twisting. Often, the most comfortable positions are those that put less stress on your recovering back.  Find a comfortable position to sleep. Use a firm mattress and lie on your side with your knees slightly bent. If you lie on your back, put a pillow under your knees.  Only take over-the-counter or prescription medicines as directed by your caregiver. Over-the-counter medicines to reduce pain and inflammation are often the most helpful.Your caregiver may prescribe muscle relaxant drugs.These medicines help dull your pain so you can more quickly return to your normal activities and healthy exercise.  Put ice on the injured area.  Put ice in a plastic bag.  Place a towel between your skin and the bag.  Leave the ice on for 15-20 minutes, 03-04 times a day for the first 2 to 3 days. After that, ice and heat may be alternated to reduce pain and spasms.  Ask your caregiver about trying back exercises and gentle massage. This may be of some benefit.  Avoid feeling anxious or stressed.Stress increases muscle tension and can worsen back pain.It is important to recognize when you are anxious or stressed and learn ways to manage it.Exercise is a great option. SEEK MEDICAL CARE IF:  You have pain that is not relieved with rest or medicine.  You have pain that does not improve in 1 week.  You have new symptoms.  You are generally not feeling well. SEEK   IMMEDIATE MEDICAL CARE IF:   You have pain that radiates from your back into your legs.  You develop new bowel or bladder control problems.  You have unusual weakness or numbness in your arms or legs.  You develop nausea or vomiting.  You develop abdominal pain.  You feel faint. Document Released: 12/01/2005 Document Revised: 06/01/2012 Document Reviewed: 04/04/2014 [employer] Patient Information 2015 [employer]. This information is not intended to replace advice given to you by your health care provider. Make sure you  discuss any questions you have with your health care provider.  

## 2015-01-10 ENCOUNTER — Other Ambulatory Visit: Payer: Self-pay | Admitting: Internal Medicine

## 2015-01-22 ENCOUNTER — Other Ambulatory Visit: Payer: No Typology Code available for payment source

## 2015-01-26 LAB — HEAVY METALS SCREEN, URINE
Arsenic, 24H Ur: 8 mcg/L (ref <81)
Lead, Urine (24 Hr): <1 mcg/L (ref <80)
Mercury 24 Hr Urine: <2 mcg/L (ref <21)

## 2015-02-12 ENCOUNTER — Encounter: Payer: Self-pay | Admitting: Internal Medicine

## 2015-02-12 ENCOUNTER — Ambulatory Visit (INDEPENDENT_AMBULATORY_CARE_PROVIDER_SITE_OTHER): Payer: No Typology Code available for payment source | Admitting: Internal Medicine

## 2015-02-12 VITALS — BP 114/76 | HR 88 | Temp 99.0°F | Resp 16 | Ht 64.5 in | Wt 140.8 lb

## 2015-02-12 DIAGNOSIS — R202 Paresthesia of skin: Secondary | ICD-10-CM

## 2015-02-12 DIAGNOSIS — I739 Peripheral vascular disease, unspecified: Secondary | ICD-10-CM

## 2015-02-12 NOTE — Progress Notes (Signed)
   Subjective:    Patient ID: Beth Miranda, female    DOB: 1961-02-05, 54 y.o.   MRN: [id number]  HPI  Very nice 53 yo MWF with remote hx/o Mixed CTD syndrome - in remission many years with persistant Raynaud's sx's whih were refractory to [facility] Rx due to BP dropping too low. Now patient returns for f/u of persistent numb paresthesias of the feet/toes . Patient does have hx/o mildly elevated A1c's in the range of 5.7-5.8% and she had more recent neg Heavy metal screening and Negative Lumbar MRI to r/o Spinal Stenosis. Oner the last month she has tried lyrica w/o significant effect on her sx's. She's also c/o ble spots of he great toes - more pronounced on the left.   Medication Sig  . aspirin 81 MG tablet Take 81 mg by mouth daily.  . Cetirizine HCl (ZYRTEC ALLERGY PO) Take 10 mg by mouth at bedtime.  . Cholecalciferol (VITAMIN D) 2000 UNITS tablet Take 2,000 Units by mouth 2 (two) times daily.  . Cyanocobalamin (VITAMIN B 12 PO) Take 1,000 mcg by mouth daily.  Marland Kitchen estradiol-norethindrone (ACTIVELLA) 1-0.5  Take 1 tablet by mouth at bedtime.  Marland Kitchen etodolac (LODINE) 400 MG tablet   . fish oil-omega-3 fatty acids 1000 MG capsule Take 1,200 mg by mouth 2 (two) times daily.  Marland Kitchen gabapentin (NEURONTIN) 300 MG capsule Take 1 tablet 3 x day for pain  . levothyroxine  50 MCG tablet Take 1 tablet (50 mcg total) by mouth daily.  . Magnesium 200 MG TABS Take 200 mg by mouth 2 (two) times daily.  . meloxicam (MOBIC) 15 MG tablet Take 1/2 to 1 tablet daily with food  for pain & inflammation  . omeprazole (PRILOSEC) 40 MG capsule Take 40 mg by mouth daily.  . Red Yeast Rice Extract  Take 1,200 mg by mouth daily.  Marland Kitchen tiZANidine (ZANAFLEX) 4 MG tablet Take 1/2 to 1 tablet 3  x day - muscle spasms  . ranitidine (ZANTAC) 300 MG tablet    Allergies  Allergen Reactions  . Lamisil [Terbinafine Hcl] Hives  . Penicillins Hives  . Sulfa Antibiotics Hives   Review of Systems  10 pt sys review non contributory to above.   Objective:   Physical Exam BP 114/76 mmHg  Pulse 88  Temp(Src) 99 F (37.2 C)  Resp 16  Ht 5' 4.5" (1.638 m)  Wt 140 lb 12.8 oz (63.866 kg)  BMI 23.80 kg/m2  LMP 03/02/2013  HEENT- neg Neck- supple. Car 2+ no Bruits Chest - clear equal BS.  Cor- RRR w/o sig MGR. No edema. Abd - soft & benign. Patient has pronounced coolness of her distal 1/2 foot to the toes with decreased sensation to monofilament testing. Pedal pulses are 2+ on the right & absent on the left.  Also noted are several circumscribed non blanching bluish areas of the left great toe.     Assessment & Plan:   1. Paresthesia of both feet  - Ambulatory referral to Neurology For PNCV's   2. PVD (peripheral vascular disease)  - Ambulatory referral to Vascular Surgery For arterial dopplers - ? Blue Toe Syndrome

## 2015-02-12 NOTE — Patient Instructions (Signed)
Peripheral Neuropathy Peripheral neuropathy is a type of nerve damage. It affects nerves that carry signals between the spinal cord and other parts of the body. These are called peripheral nerves. With peripheral neuropathy, one nerve or a group of nerves may be damaged.  CAUSES  Many things can damage peripheral nerves. For some people with peripheral neuropathy, the cause is unknown. Some causes include:  Diabetes. This is the most common cause of peripheral neuropathy.  Injury to a nerve.  Pressure or stress on a nerve that lasts a long time.  Too little vitamin B. Alcoholism can lead to this.  Infections.  Autoimmune diseases  Inherited nerve diseases.  Some medicines, such as cancer drugs.  Toxic substances, such as lead and mercury.  Too little blood flowing to the legs.  Kidney disease.  Thyroid disease. SIGNS AND SYMPTOMS  Different people have different symptoms. The symptoms you have will depend on which of your nerves is damaged. Common symptoms include:  Loss of feeling (numbness) in the feet and hands.  Tingling in the feet and hands.  Pain that burns.  Very sensitive skin.  Weakness.  Not being able to move a part of the body (paralysis).  Muscle twitching.  Clumsiness or poor coordination.  Loss of balance.  Not being able to control your bladder.  Feeling dizzy.  Sexual problems. DIAGNOSIS  Peripheral neuropathy is a symptom, not a disease. Finding the cause of peripheral neuropathy can be hard. To figure that out, your health care provider will take a medical history and do a physical exam. A neurological exam will also be done. This involves checking things affected by your brain, spinal cord, and nerves (nervous system). For example, your health care provider will check your reflexes, how you move, and what you can feel.  Other types of tests may also be ordered, such as:  Blood tests.  A test of the fluid in your spinal cord.  Imaging  tests, such as CT scans or an MRI.  Electromyography (EMG). This test checks the nerves that control muscles.  Nerve conduction velocity tests. These tests check how fast messages pass through your nerves.  Nerve biopsy. A small piece of nerve is removed. It is then checked under a microscope. TREATMENT   Medicine is often used to treat peripheral neuropathy. Medicines may include:  Pain-relieving medicines. Prescription or over-the-counter medicine may be suggested.  Antiseizure medicine. This may be used for pain.  Antidepressants. These also may help ease pain from neuropathy.  Lidocaine. This is a numbing medicine. You might wear a patch or be given a shot.  Mexiletine. This medicine is typically used to help control irregular heart rhythms.  Surgery. Surgery may be needed to relieve pressure on a nerve or to destroy a nerve that is causing pain.  Physical therapy to help movement.  Assistive devices to help movement. HOME CARE INSTRUCTIONS   Only take over-the-counter or prescription medicines as directed by your health care provider. Follow the instructions carefully for any given medicines. Do not take any other medicines without first getting approval from your health care provider.  If you have diabetes, work closely with your health care provider to keep your blood sugar under control.  If you have numbness in your feet:  Check every day for signs of injury or infection. Watch for redness, warmth, and swelling.  Wear padded socks and comfortable shoes. These help protect your feet.  Do not do things that put pressure on your damaged nerve.  Do not smoke. Smoking keeps blood from getting to damaged nerves.  Avoid or limit alcohol. Too much alcohol can cause a lack of B vitamins. These vitamins are needed for healthy nerves.  Develop a good support system. Coping with peripheral neuropathy can be stressful. Talk to a mental health specialist or join a support group  if you are struggling.  Follow up with your health care provider as directed. SEEK MEDICAL CARE IF:   You have new signs or symptoms of peripheral neuropathy.  You are struggling emotionally from dealing with peripheral neuropathy.  You have a fever. SEEK IMMEDIATE MEDICAL CARE IF:   You have an injury or infection that is not healing.  You feel very dizzy or begin vomiting.  You have chest pain.  You have trouble breathing. Document Released: 11/21/2002 Document Revised: 08/13/2011 Document Reviewed: 08/08/2013 [facility] Patient Information 2015 [place], [place]. This information is not intended to replace advice given to you by your health care provider. Make sure you discuss any questions you have with your health care provider.

## 2015-02-21 ENCOUNTER — Other Ambulatory Visit: Payer: Self-pay | Admitting: *Deleted

## 2015-02-21 DIAGNOSIS — I739 Peripheral vascular disease, unspecified: Secondary | ICD-10-CM

## 2015-03-02 ENCOUNTER — Ambulatory Visit (INDEPENDENT_AMBULATORY_CARE_PROVIDER_SITE_OTHER): Payer: No Typology Code available for payment source | Admitting: Neurology

## 2015-03-02 ENCOUNTER — Encounter: Payer: Self-pay | Admitting: Neurology

## 2015-03-02 VITALS — BP 120/74 | HR 87 | Ht 64.5 in | Wt 143.1 lb

## 2015-03-02 DIAGNOSIS — G609 Hereditary and idiopathic neuropathy, unspecified: Secondary | ICD-10-CM

## 2015-03-02 DIAGNOSIS — R7309 Other abnormal glucose: Secondary | ICD-10-CM

## 2015-03-02 DIAGNOSIS — R7303 Prediabetes: Secondary | ICD-10-CM

## 2015-03-02 LAB — VITAMIN B12: Vitamin B-12: 691 pg/mL (ref 211–911)

## 2015-03-02 NOTE — Patient Instructions (Addendum)
Based on your history and exam, you most likely have peripheral neurology.  I recommend that we do blood work and schedule you for the NCS/EMG of the legs.  I will see you back after your testing.

## 2015-03-02 NOTE — Progress Notes (Signed)
[facility] Clinic Note - Initial Visit   Date: 03/02/2015   Beth Miranda MRN: [id number] DOB: 1961/05/19   Dear Dr. Melford Aase:  Thank you for your kind referral of Beth Miranda for consultation of bilateral toe numbness. Although her history is well known to you, please allow [place] to reiterate it for the purpose of our medical record. The patient was accompanied to the clinic by self.   History of Present Illness: Beth Miranda is a 54 y.o. left-handed Caucasian female with GERD, hypothyroid, hyperlipidemia, Raynaud's disease, prediabetes, and vitamin D deficiency presenting for evaluation of bilateral toe numbness.    Starting in October 2015, she developed numbness of her toes which has been constant since onset. It only involves the pads of her toes (right > left).  No associated weakness, imbalance, or pain. She did not notice any triggers, exacerbating, or alleviating factors.  She has noticed a slight tingling over there left lower leg, lasting a few seconds.   In December, she went to her PCP for the symptoms and also low back pain.  She underwent MRI lumbar spine which did not show any nerve impingement.    No family history of neuropathy.  Out-side paper records, electronic medical record, and images have been reviewed where available and summarized as:  MRI lumbar spine wo contrast 01/01/2015: 1. Bilateral L5 pars defects without listhesis. 2. Minimal lumbar disc degeneration without stenosis.  Labs 01/22/2015:  Heavy metal screen negative Labs 11/14/2014:  HbA1c 5.8  Lab Results  Component Value Date   VITAMINB12 653 05/09/2014     Past Medical History  Diagnosis Date  . Raynaud's disease   . Labile hypertension   . GERD (gastroesophageal reflux disease)   . Thyroid disease     hypothyroid  . Allergy     seasonal  . Hyperlipidemia   . Prediabetes   . Vitamin D deficiency     Past Surgical History  Procedure Laterality Date  .  Tonsillectomy       Medications:  Current Outpatient Prescriptions on File Prior to Visit  Medication Sig Dispense Refill  . aspirin 81 MG tablet Take 81 mg by mouth daily.    . Cetirizine HCl (ZYRTEC ALLERGY PO) Take 10 mg by mouth at bedtime.    . Cholecalciferol (VITAMIN D) 2000 UNITS tablet Take 2,000 Units by mouth 2 (two) times daily.    . Cyanocobalamin (VITAMIN B 12 PO) Take 1,000 mcg by mouth daily.    Marland Kitchen estradiol-norethindrone (ACTIVELLA) 1-0.5 MG per tablet Take 1 tablet by mouth at bedtime.    . fish oil-omega-3 fatty acids 1000 MG capsule Take 1,200 mg by mouth 2 (two) times daily.    Marland Kitchen levothyroxine (SYNTHROID, LEVOTHROID) 50 MCG tablet Take 1 tablet (50 mcg total) by mouth daily. 90 tablet 99  . Magnesium 200 MG TABS Take 200 mg by mouth 2 (two) times daily.    . ranitidine (ZANTAC) 300 MG tablet     . Red Yeast Rice Extract (RED YEAST RICE PO) Take 1,200 mg by mouth daily.     No current facility-administered medications on file prior to visit.    Allergies:  Allergies  Allergen Reactions  . Lamisil [Terbinafine Hcl] Hives  . Penicillins Hives  . Sulfa Antibiotics Hives    Family History: Family History  Problem Relation Age of Onset  . Pancreatic cancer Paternal Grandmother   . Arthritis Mother   . COPD Mother   . Asthma Mother   .  Hypertension Father   . Diabetes Father   . Kidney disease Father     Stones    Social History: History   Social History  . Marital Status: Married    Spouse Name: N/A  . Number of Children: N/A  . Years of Education: N/A   Occupational History  . Not on file.   Social History Main Topics  . Smoking status: Never Smoker   . Smokeless tobacco: Never Used  . Alcohol Use: 0.6 oz/week    1 Standard drinks or equivalent per week  . Drug Use: No  . Sexual Activity: Not on file   Other Topics Concern  . Not on file   Social History Narrative   Lives with husband in a one story home with basement.  No children.   Works for [employer].    Education: 2 years college.    Review of Systems:  CONSTITUTIONAL: No fevers, chills, night sweats, or weight loss.   EYES: No visual changes or eye pain ENT: No hearing changes.  No history of nose bleeds.   RESPIRATORY: No cough, wheezing and shortness of breath.   CARDIOVASCULAR: Negative for chest pain, and palpitations.   GI: Negative for abdominal discomfort, blood in stools or black stools.  No recent change in bowel habits.   GU:  No history of incontinence.   MUSCLOSKELETAL: No history of joint pain or swelling.  No myalgias.   SKIN: Negative for lesions, rash, and itching.   HEMATOLOGY/ONCOLOGY: Negative for prolonged bleeding, bruising easily, and swollen nodes.  No history of cancer.   ENDOCRINE: Negative for cold or heat intolerance, polydipsia or goiter.   PSYCH:  No depression or anxiety symptoms.   NEURO: As Above.   Vital Signs:  BP 120/74 mmHg  Pulse 87  Ht 5' 4.5" (1.638 m)  Wt 143 lb 1 oz (64.893 kg)  BMI 24.19 kg/m2  SpO2 95%  LMP 03/02/2013   General Medical Exam:   General:  Well appearing, comfortable.   Eyes/ENT: see cranial nerve examination.   Neck: No masses appreciated.  Full range of motion without tenderness.  No carotid bruits. Respiratory:  Clear to auscultation, good air entry bilaterally.   Cardiac:  Regular rate and rhythm, no murmur.   Extremities:  No deformities, edema, or skin discoloration.  Skin:  No rashes or lesions.  Neurological Exam: MENTAL STATUS including orientation to time, place, person, recent and remote memory, attention span and concentration, language, and fund of knowledge is normal.  Speech is not dysarthric.  CRANIAL NERVES: II:  No visual field defects.  Unremarkable fundi.   III-IV-VI: Pupils equal round and reactive to light.  Normal conjugate, extra-ocular eye movements in all directions of gaze.  No nystagmus.  No ptosis.   V:  Normal facial sensation.   VII:  Normal facial  symmetry and movements.  VIII:  Normal hearing and vestibular function.   IX-X:  Normal palatal movement.   XI:  Normal shoulder shrug and head rotation.   XII:  Normal tongue strength and range of motion, no deviation or fasciculation.  MOTOR:  No atrophy, fasciculations or abnormal movements.  No pronator drift.  Tone is normal.    Right Upper Extremity:    Left Upper Extremity:    Deltoid  5/5   Deltoid  5/5   Biceps  5/5   Biceps  5/5   Triceps  5/5   Triceps  5/5   Wrist extensors  5/5  Wrist extensors  5/5   Wrist flexors  5/5   Wrist flexors  5/5   Finger extensors  5/5   Finger extensors  5/5   Finger flexors  5/5   Finger flexors  5/5   Dorsal interossei  5/5   Dorsal interossei  5/5   Abductor pollicis  5/5   Abductor pollicis  5/5   Tone (Ashworth scale)  0  Tone (Ashworth scale)  0   Right Lower Extremity:    Left Lower Extremity:    Hip flexors  5/5   Hip flexors  5/5   Hip extensors  5/5   Hip extensors  5/5   Knee flexors  5/5   Knee flexors  5/5   Knee extensors  5/5   Knee extensors  5/5   Dorsiflexors  5/5   Dorsiflexors  5/5   Plantarflexors  5/5   Plantarflexors  5/5   Toe extensors  5/5   Toe extensors  5/5   Toe flexors  5/5   Toe flexors  5/5   Tone (Ashworth scale)  0  Tone (Ashworth scale)  0   MSRs:  Right                                                                 Left brachioradialis 2+  brachioradialis 2+  biceps 2+  biceps 2+  triceps 2+  triceps 2+  patellar 2+  patellar 2+  ankle jerk 2+  ankle jerk 2+  Hoffman no  Hoffman no  plantar response down  plantar response down   SENSORY:  Vibration is diminished to 50% at great toe bilaterally, temperature and pin prick reduced distally in the feet.  Proprioception and light touch intact. Romberg's sign absent.   COORDINATION/GAIT: Normal finger-to- nose-finger and heel-to-shin.  Intact rapid alternating movements bilaterally.  Able to rise from a chair without using arms.  Gait narrow based  and stable. Tandem and stressed gait intact.    IMPRESSION: Mrs. Acklin is a delightful 54 year-old female presenting for evaluation of bilateral toe numbness which started October.  Her neurological examination shows a distal predominant large fiber peripheral neuropathy. I had extensive discussion with the patient regarding the pathogenesis, etiology, management, and natural course of neuropathy. Neuropathy tends to be slowly progressive, especially if a treatable etiology is not identified.  I would like to test for treatable causes of neuropathy. I discussed that in the vast majority of cases, despite checking for reversible causes, we are unable to find the underlying etiology and management is symptomatic.  I also explained that since her symptom is predominately numbness, unfortunately, there is no effective treatment for numbness.  If she develops painful paresthesias, neuralgesic medication can be started.   PLAN/RECOMMENDATIONS:  1.  Vitamin B1, B12, B6, copper, ceruloplasmin, SPEP/UPEP with IFE, ANA, ENA 2.  EMG of the legs (R > L) 3.  Return to clinic in 2 months   The duration of this appointment visit was 40 minutes of face-to-face time with the patient.  Greater than 50% of this time was spent in counseling, explanation of diagnosis, planning of further management, and coordination of care.   Thank you for allowing me to participate in patient's care.  If I can answer any additional  questions, I would be pleased to do so.    Sincerely,    Lyndee Herbst K. Posey Pronto, DO

## 2015-03-05 LAB — ENA 9 PANEL
Centromere Ab Screen: <1
ENA SM Ab Ser-aCnc: <1
Jo-1 Antibody, IgG: <1
Ribosomal P Protein Ab: <1
SM/RNP: >8 — ABNORMAL HIGH
SSA (Ro) (ENA) Antibody, IgG: <1
SSB (La) (ENA) Antibody, IgG: <1
Scleroderma (Scl-70) (ENA) Antibody, IgG: <1
ds DNA Ab: <1 IU/mL

## 2015-03-05 LAB — ANTI-NUCLEAR AB-TITER (ANA TITER): ANA Titer 1: 1:40 {titer} — ABNORMAL HIGH

## 2015-03-05 LAB — VITAMIN B6: Vitamin B6: 19.1 ng/mL (ref 2.1–21.7)

## 2015-03-05 LAB — CERULOPLASMIN: Ceruloplasmin: 23 mg/dL (ref 18–53)

## 2015-03-05 LAB — ANA: Anti Nuclear Antibody(ANA): POSITIVE — AB

## 2015-03-05 LAB — COPPER, SERUM: Copper: 98 ug/dL (ref 70–175)

## 2015-03-06 LAB — SPEP & IFE WITH QIG
Albumin ELP: 4.3 g/dL (ref 3.8–4.8)
Alpha-1-Globulin: 0.3 g/dL (ref 0.2–0.3)
Alpha-2-Globulin: 0.7 g/dL (ref 0.5–0.9)
Beta 2: 0.3 g/dL (ref 0.2–0.5)
Beta Globulin: 0.4 g/dL (ref 0.4–0.6)
Gamma Globulin: 1.2 g/dL (ref 0.8–1.7)
IgA: 200 mg/dL (ref 69–380)
IgG (Immunoglobin G), Serum: 1300 mg/dL (ref 690–1700)
IgM, Serum: 30 mg/dL — ABNORMAL LOW (ref 52–322)
Total Protein, Serum Electrophoresis: 7.1 g/dL (ref 6.1–8.1)

## 2015-03-06 LAB — UIFE/LIGHT CHAINS/TP QN, 24-HR UR
Albumin, U: DETECTED
Total Protein, Urine: <4 mg/dL — ABNORMAL LOW (ref 5–24)

## 2015-03-06 LAB — VITAMIN B1: Vitamin B1 (Thiamine): 31 nmol/L — ABNORMAL HIGH (ref 8–30)

## 2015-03-07 ENCOUNTER — Telehealth: Payer: Self-pay | Admitting: Neurology

## 2015-03-07 NOTE — Telephone Encounter (Signed)
Called patient with the results of her laboratory testing which shows mild positive ANA (1:40 speckled), Anti-SM/RNP antibody positive >8.0 - remaining labs are within normal limits.  Patient reports having a history of mixed connective tissue disease and was seeing a rheumatologist about 18 years ago at which time she was on Plaquenil, but has not been on any medication for number of years now. She has occasional arthralgias and myalgias.  We will proceed with EMG and depending on the results, rheumatology evaluation may be considered.  Minami Arriaga K. Posey Pronto, DO

## 2015-03-13 ENCOUNTER — Encounter: Payer: Self-pay | Admitting: Vascular Surgery

## 2015-03-14 ENCOUNTER — Ambulatory Visit (INDEPENDENT_AMBULATORY_CARE_PROVIDER_SITE_OTHER): Payer: No Typology Code available for payment source | Admitting: Vascular Surgery

## 2015-03-14 ENCOUNTER — Encounter: Payer: Self-pay | Admitting: Vascular Surgery

## 2015-03-14 ENCOUNTER — Ambulatory Visit (HOSPITAL_COMMUNITY)
Admission: RE | Admit: 2015-03-14 | Discharge: 2015-03-14 | Disposition: A | Discharge status: Home / Self Care | Payer: No Typology Code available for payment source | Source: Ambulatory Visit | Attending: Vascular Surgery | Admitting: Vascular Surgery

## 2015-03-14 VITALS — BP 131/86 | HR 90 | Resp 14 | Ht 64.5 in | Wt 142.0 lb

## 2015-03-14 DIAGNOSIS — I739 Peripheral vascular disease, unspecified: Secondary | ICD-10-CM | POA: Insufficient documentation

## 2015-03-14 DIAGNOSIS — I75023 Atheroembolism of bilateral lower extremities: Secondary | ICD-10-CM

## 2015-03-14 NOTE — Addendum Note (Signed)
Addended by: Dorthula Rue L on: 03/14/2015 05:29 PM   Modules accepted: Orders

## 2015-03-14 NOTE — Progress Notes (Signed)
[facility] [place] HISTORY AND PHYSICAL   History of Present Illness:  Patient is a 54 y.o. year old female who presents for evaluation of bilateral foot ulcerations. The patient had an episode of blistering on her left foot approximately one month ago. She has a lengthy history of mixed connective tissue disorder. She has also had Raynaud's flareups in the past. She states she hasn't had ulcers on the tips of her toes in the past but this was much worse. She was noted by her primary care physician and have an abnormal pulse exam. She sent here for further evaluation. The patient also describes numbness and tingling in both of her feet that has been present since October. The right foot is worse than the left. She has been able to spontaneously heal all of the ulcers on her feet. She currently has no open wounds. She denies history of hypertension elevated cholesterol or tobacco abuse. She states she has had a borderline hemoglobin A1c of 5.8. She was placed on Lyrica and a past but apparently did not get much benefit from this.   Past Medical History  Diagnosis Date  . Raynaud's disease   . Labile hypertension   . GERD (gastroesophageal reflux disease)   . Thyroid disease     hypothyroid  . Allergy     seasonal  . Hyperlipidemia   . Prediabetes   . Vitamin D deficiency     Past Surgical History  Procedure Laterality Date  . Tonsillectomy      Social History History  Substance Use Topics  . Smoking status: Never Smoker   . Smokeless tobacco: Never Used  . Alcohol Use: 0.6 oz/week    1 Standard drinks or equivalent per week     Comment: 2 glasses liquor per week    Family History Family History  Problem Relation Age of Onset  . Pancreatic cancer Paternal Grandmother   . Arthritis Mother     Living  . COPD Mother   . Asthma Mother   . Hypertension Father     Living  . Diabetes Father   . Kidney disease Father     Stones  . Other Brother      Deceased    Allergies  Allergies  Allergen Reactions  . Lamisil [Terbinafine Hcl] Hives  . Penicillins Hives  . Sulfa Antibiotics Hives     Current Outpatient Prescriptions  Medication Sig Dispense Refill  . aspirin 81 MG tablet Take 81 mg by mouth daily.    . Cetirizine HCl (ZYRTEC ALLERGY PO) Take 10 mg by mouth at bedtime.    . Cholecalciferol (VITAMIN D) 2000 UNITS tablet Take 2,000 Units by mouth 2 (two) times daily.    . Cyanocobalamin (VITAMIN B 12 PO) Take 1,000 mcg by mouth daily.    Marland Kitchen estradiol-norethindrone (ACTIVELLA) 1-0.5 MG per tablet Take 1 tablet by mouth at bedtime.    . fish oil-omega-3 fatty acids 1000 MG capsule Take 1,200 mg by mouth 2 (two) times daily.    Marland Kitchen levothyroxine (SYNTHROID, LEVOTHROID) 50 MCG tablet Take 1 tablet (50 mcg total) by mouth daily. 90 tablet 99  . Magnesium 200 MG TABS Take 200 mg by mouth 2 (two) times daily.    . ranitidine (ZANTAC) 300 MG tablet     . Red Yeast Rice Extract (RED YEAST RICE PO) Take 1,200 mg by mouth daily.     No current facility-administered medications for this visit.    ROS:  General:  No weight loss, Fever, chills  HEENT: No recent headaches, no nasal bleeding, no visual changes, no sore throat  Neurologic: No dizziness, blackouts, seizures. No recent symptoms of stroke or mini- stroke. No recent episodes of slurred speech, or temporary blindness.  Cardiac: No recent episodes of chest pain/pressure, no shortness of breath at rest.  No shortness of breath with exertion.  Denies history of atrial fibrillation or irregular heartbeat  Vascular: No history of rest pain in feet.  No history of claudication.  No history of non-healing ulcer, No history of DVT   Pulmonary: No home oxygen, no productive cough, no hemoptysis,  No asthma or wheezing  Musculoskeletal:  [ ]  Arthritis, [ ]  Low back pain,  [ ]  Joint pain  Hematologic:No history of hypercoagulable state.  No history of easy bleeding.  No history of  anemia  Gastrointestinal: No hematochezia or melena,  No gastroesophageal reflux, no trouble swallowing  Urinary: [ ]  chronic Kidney disease, [ ]  on HD - [ ]  MWF or [ ]  TTHS, [ ]  Burning with urination, [ ]  Frequent urination, [ ]  Difficulty urinating;   Skin: No rashes  Psychological: No history of anxiety,  No history of depression   Physical Examination  Filed Vitals:   03/14/15 1354  BP: 131/86  Pulse: 90  Resp: 14  Height: 5' 4.5" (1.638 m)  Weight: 142 lb (64.411 kg)    Body mass index is 24.01 kg/(m^2).  General:  Alert and oriented, no acute distress HEENT: Normal Neck: No bruit or JVD Pulmonary: Clear to auscultation bilaterally Cardiac: Regular Rate and Rhythm without murmur Abdomen: Soft, non-tender, non-distended, no mass, no scars Skin: No rash, toes are dusky bilaterally especially on the plantar aspect Extremity Pulses:  2+ radial, brachial, femoral, right 2+ dorsalis pedis, absent left dorsalis pedis pulse 2+ posterior tibial pulses bilaterally Musculoskeletal: No deformity or edema  Neurologic: Upper and lower extremity motor 5/5 and symmetric  DATA:  Patient had bilateral ABIs performed today which were greater than 1 bilaterally. However she had no flow or no visualized segment of dorsalis pedis artery on the left foot.   ASSESSMENT:  Possible atheroembolic event causing recent ulcerations on her feet. However, this could still represent a bad flareup of her Raynaud's.   PLAN:  Best option would be CT angiogram of the abdomen and pelvis with bilateral lower extremity runoff to make sure she does not have ulcerated plaque anywhere that could be a culprit and an atheroembolic event. She'll return for follow-up after her CT scan. If the CT scan shows no significant arterial findings the most likely aspirin therapy.  Ruta Hinds, MD [facility] [place] Office: [phone] Pager: [phone]

## 2015-03-28 ENCOUNTER — Ambulatory Visit
Admission: RE | Admit: 2015-03-28 | Discharge: 2015-03-28 | Disposition: A | Discharge status: Home / Self Care | Payer: No Typology Code available for payment source | Source: Ambulatory Visit | Attending: Vascular Surgery | Admitting: Vascular Surgery

## 2015-03-28 DIAGNOSIS — I75023 Atheroembolism of bilateral lower extremities: Secondary | ICD-10-CM

## 2015-03-28 MED ORDER — IOPAMIDOL (ISOVUE-300) INJECTION 61%
165.0000 mL | Freq: Once | INTRAVENOUS | Status: AC | PRN
  Administered 2015-03-28: 165 mL via INTRAVENOUS

## 2015-04-05 ENCOUNTER — Ambulatory Visit (INDEPENDENT_AMBULATORY_CARE_PROVIDER_SITE_OTHER): Payer: No Typology Code available for payment source | Admitting: Neurology

## 2015-04-05 DIAGNOSIS — G609 Hereditary and idiopathic neuropathy, unspecified: Secondary | ICD-10-CM

## 2015-04-05 DIAGNOSIS — R7303 Prediabetes: Secondary | ICD-10-CM

## 2015-04-05 NOTE — Procedures (Signed)
[facility]  [address], [address]  [place], [place] [address] Tel: [phone] Fax:  [phone] Test Date:  04/05/2015  Patient: Beth Miranda DOB: 04-22-1961 Physician: Narda Amber  Sex: Female Height: 5' 4.5" Ref Phys: Narda Amber  ID#: [id number] Temp: 32.0C Technician: Laureen Ochs R. NCS T.   Patient Complaints: Patient is a 54 year old female here for evaluation of her bilateral toe numbness right side worse than left.  NCV & EMG Findings: Extensive electrodiagnostic testing of the right lower extremity and additional studies of the left shows:  1. Bilateral sural and superficial peroneal sensory responses are within normal limits. 2. Bilateral peroneal motor responses are within normal limits. Bilateral tibial motor responses are within normal limits distally except that patient did not tolerate maximal stimulation of the tibial nerve at the popliteal fossa, and therefore proximal amplitude is reduced and technical in nature. 3. Bilateral tibial H reflex studies are mildly prolonged. These findings are of uncertain clinical significance.  4. There is no evidence of active or chronic motor axon loss changes affecting any of the tested muscles. Motor unit configuration and recruitment pattern is within normal limits.  Impression: This is a normal study of the lower extremities. In particular, there is no evidence of a generalized large fiber sensorimotor polyneuropathy or lumbosacral radiculopathy affecting the legs. A small fiber neuropathy cannot be excluded by this study.   ___________________________ Narda Amber    Nerve Conduction Studies Anti Sensory Summary Table   Stim Site NR Peak (ms) Norm Peak (ms) P-T Amp (V) Norm P-T Amp  Left Sup Peroneal Anti Sensory (Ant Lat Mall)  32C  12 cm    3.2 <4.6 5.8 >4  Right Sup Peroneal Anti Sensory (Ant Lat Mall)  32C  12 cm    2.6 <4.6 5.1 >4  Left Sural Anti Sensory (Lat Mall)  32C  Calf    3.6 <4.6 7.0  >4  Right Sural Anti Sensory (Lat Mall)  32C  Calf    4.0 <4.6 7.6 >4   Motor Summary Table   Stim Site NR Onset (ms) Norm Onset (ms) O-P Amp (mV) Norm O-P Amp Site1 Site2 Delta-0 (ms) Dist (cm) Vel (m/s) Norm Vel (m/s)  Left Peroneal Motor (Ext Dig Brev)  32C  Ankle    4.8 <6.0 3.0 >2.5 B Fib Ankle 7.9 31.5 40 >40  B Fib    12.7  2.9  Poplt B Fib 2.2 10.0 45 >40  Poplt    14.9  2.8         Right Peroneal Motor (Ext Dig Brev)  32C  Ankle    4.0 <6.0 2.8 >2.5 B Fib Ankle 8.3 33.0 40 >40  B Fib    12.3  2.8  Poplt B Fib 2.0 8.5 43 >40  Poplt    14.3  2.7         Left Tibial Motor (Abd Hall Brev)  32C    unable to tolerate  Ankle    4.9 <6.0 6.9 >4 Knee Ankle 8.9 0.0  >40  Knee    13.8  0.0         Right Tibial Motor (Abd Hall Brev)  32C    unable to tolerate  Ankle    4.6 <6.0 6.6 >4 Knee Ankle 10.6 42.0 40 >40  Knee    15.2  2.2          H Reflex Studies   NR H-Lat (ms) Lat Norm (ms)  L-R H-Lat (ms)  Left Tibial (Gastroc)  32C     37.28 <35 0.00  Right Tibial (Gastroc)  32C     37.28 <35 0.00   EMG   Side Muscle Ins Act Fibs Psw Fasc Number Recrt Dur Dur. Amp Amp. Poly Poly. Comment  Left AntTibialis Nml Nml Nml Nml Nml Nml Nml Nml Nml Nml Nml Nml N/A  Left Gastroc Nml Nml Nml Nml Nml Nml Nml Nml Nml Nml Nml Nml N/A  Left Flex Dig Long Nml Nml Nml Nml Nml Nml Nml Nml Nml Nml Nml Nml N/A  Left RectFemoris Nml Nml Nml Nml Nml Nml Nml Nml Nml Nml Nml Nml N/A  Right AntTibialis Nml Nml Nml Nml Nml Nml Nml Nml Nml Nml Nml Nml N/A  Right Gastroc Nml Nml Nml Nml Nml Nml Nml Nml Nml Nml Nml Nml N/A  Right Flex Dig Long Nml Nml Nml Nml Nml Nml Nml Nml Nml Nml Nml Nml N/A  Right RectFemoris Nml Nml Nml Nml Nml Nml Nml Nml Nml Nml Nml Nml N/A  Right GluteusMed Nml Nml Nml Nml Nml Nml Nml Nml Nml Nml Nml Nml N/A      Waveforms:

## 2015-04-11 ENCOUNTER — Encounter: Payer: Self-pay | Admitting: Vascular Surgery

## 2015-04-12 ENCOUNTER — Encounter: Payer: Self-pay | Admitting: Vascular Surgery

## 2015-04-12 ENCOUNTER — Ambulatory Visit (INDEPENDENT_AMBULATORY_CARE_PROVIDER_SITE_OTHER): Payer: No Typology Code available for payment source | Admitting: Vascular Surgery

## 2015-04-12 VITALS — BP 134/83 | HR 100 | Ht 64.5 in | Wt 139.4 lb

## 2015-04-12 DIAGNOSIS — I7300 Raynaud's syndrome without gangrene: Secondary | ICD-10-CM | POA: Diagnosis not present

## 2015-04-12 NOTE — Progress Notes (Signed)
[facility] [place] HISTORY AND PHYSICAL    History of Present Illness:  Patient is a 54 y.o. year old female who presents for evaluation of bilateral foot ulcerations. The patient had an episode of blistering on her left foot approximately one month ago. She has a lengthy history of mixed connective tissue disorder. She has also had Raynaud's flareups in the past. She states she hasn't had ulcers on the tips of her toes in the past but this was much worse. She was noted by her primary care physician and have an abnormal pulse exam. She sent here for further evaluation. The patient also describes numbness and tingling in both of her feet that has been present since October. The right foot is worse than the left. She has been able to spontaneously heal all of the ulcers on her feet. She currently has no open wounds. She denies history of hypertension elevated cholesterol or tobacco abuse. She states she has had a borderline hemoglobin A1c of 5.8. She was placed on Lyrica and a past but apparently did not get much benefit from this.   She returns today for follow-up after recent CT angiogram. She has had no new ulcers develop in the interim.   She did recently have a nerve conduction test performed at [facility] neurology by Dr. Posey Pronto. She has not had the results of these discussed with her yet.     ROS:    Cardiac: No recent episodes of chest pain/pressure, no shortness of breath at rest.  No shortness of breath with exertion.  Denies history of atrial fibrillation or irregular heartbeat  Vascular: No history of rest pain in feet.  No history of claudication.   No history of DVT    Musculoskeletal:  [ ]  Arthritis, [ x] Low back pain,  [ ]  Joint pain    Physical Examination    Filed Vitals:   04/12/15 1543  BP: 134/83  Pulse: 100  Height: 5' 4.5" (1.638 m)  Weight: 139 lb 6.4 oz (63.231 kg)  SpO2: 100%    General:  Alert and oriented, no acute distress HEENT:  Normal Skin: No rash, toes are dusky bilaterally especially on the plantar aspect Extremity Pulses:  2+ radial, brachial, femoral, right 2+ dorsalis pedis, absent left dorsalis pedis pulse 2+ posterior tibial pulses bilaterally Musculoskeletal: No deformity or edema     Neurologic: Upper and lower extremity motor 5/5 and symmetric  DATA:  I reviewed the images from the patient's CT angiogram. This shows widely patent aortoiliac femoral popliteal and tibial vessels with the exception of the left anterior tibial artery where the anterior tibial artery is atretic as well as the dorsalis pedis artery. This is a anatomic variant and does not appear to be due to embolic or atherosclerotic event  ASSESSMENT:  most likely recent flareup of ulceration was due to a Raynaud's or connective tissue vasospastic-type disorder. No evidence of large vessel occlusive disease or atheroembolic event  PLAN:  patient will follow-up on as-needed basis if she has an additional flareup. She will return for follow-up with her primary care physician if she has nonhealing wounds that lasted more than 3 weeks. She will continue to pursue her neurologic workup for the numbness and tingling in her feet and her back pain.  Ruta Hinds, MD [facility] [place] Office: [phone] Pager: [phone]

## 2015-04-16 ENCOUNTER — Encounter: Payer: Self-pay | Admitting: Internal Medicine

## 2015-04-16 ENCOUNTER — Ambulatory Visit (INDEPENDENT_AMBULATORY_CARE_PROVIDER_SITE_OTHER): Payer: No Typology Code available for payment source | Admitting: Internal Medicine

## 2015-04-16 VITALS — BP 106/68 | HR 66 | Temp 98.2°F | Resp 16 | Ht 64.5 in | Wt 141.0 lb

## 2015-04-16 DIAGNOSIS — M545 Low back pain, unspecified: Secondary | ICD-10-CM

## 2015-04-16 MED ORDER — TRAMADOL HCL 50 MG PO TABS
50.0000 mg | ORAL_TABLET | Freq: Four times a day (QID) | ORAL | Status: DC | PRN
Start: 2015-04-16 — End: 2015-05-10

## 2015-04-16 MED ORDER — DIAZEPAM 5 MG PO TABS: 5.0000 mg | ORAL_TABLET | Freq: Three times a day (TID) | ORAL | Status: DC | PRN

## 2015-04-16 MED ORDER — PREDNISONE 10 MG PO TABS: ORAL_TABLET | ORAL | Status: DC

## 2015-04-16 MED ORDER — NAPROXEN SODIUM 275 MG PO TABS: 275.0000 mg | ORAL_TABLET | Freq: Two times a day (BID) | ORAL | Status: AC

## 2015-04-16 NOTE — Patient Instructions (Signed)
Back Pain, Adult Low back pain is very common. About 1 in 5 people have back pain.The cause of low back pain is rarely dangerous. The pain often gets better over time.About half of people with a sudden onset of back pain feel better in just 2 weeks. About 8 in 10 people feel better by 6 weeks.  CAUSES Some common causes of back pain include:  Strain of the muscles or ligaments supporting the spine.  Wear and tear (degeneration) of the spinal discs.  Arthritis.  Direct injury to the back. DIAGNOSIS Most of the time, the direct cause of low back pain is not known.However, back pain can be treated effectively even when the exact cause of the pain is unknown.Answering your caregiver's questions about your overall health and symptoms is one of the most accurate ways to make sure the cause of your pain is not dangerous. If your caregiver needs more information, he or she may order lab work or imaging tests (X-rays or MRIs).However, even if imaging tests show changes in your back, this usually does not require surgery. HOME CARE INSTRUCTIONS For many people, back pain returns.Since low back pain is rarely dangerous, it is often a condition that people can learn to manageon their own.   Remain active. It is stressful on the back to sit or stand in one place. Do not sit, drive, or stand in one place for more than 30 minutes at a time. Take short walks on level surfaces as soon as pain allows.Try to increase the length of time you walk each day.  Do not stay in bed.Resting more than 1 or 2 days can delay your recovery.  Do not avoid exercise or work.Your body is made to move.It is not dangerous to be active, even though your back may hurt.Your back will likely heal faster if you return to being active before your pain is gone.  Pay attention to your body when you bend and lift. Many people have less discomfortwhen lifting if they bend their knees, keep the load close to their bodies,and  avoid twisting. Often, the most comfortable positions are those that put less stress on your recovering back.  Find a comfortable position to sleep. Use a firm mattress and lie on your side with your knees slightly bent. If you lie on your back, put a pillow under your knees.  Only take over-the-counter or prescription medicines as directed by your caregiver. Over-the-counter medicines to reduce pain and inflammation are often the most helpful.Your caregiver may prescribe muscle relaxant drugs.These medicines help dull your pain so you can more quickly return to your normal activities and healthy exercise.  Put ice on the injured area.  Put ice in a plastic bag.  Place a towel between your skin and the bag.  Leave the ice on for 15-20 minutes, 03-04 times a day for the first 2 to 3 days. After that, ice and heat may be alternated to reduce pain and spasms.  Ask your caregiver about trying back exercises and gentle massage. This may be of some benefit.  Avoid feeling anxious or stressed.Stress increases muscle tension and can worsen back pain.It is important to recognize when you are anxious or stressed and learn ways to manage it.Exercise is a great option. SEEK MEDICAL CARE IF:  You have pain that is not relieved with rest or medicine.  You have pain that does not improve in 1 week.  You have new symptoms.  You are generally not feeling well. SEEK   IMMEDIATE MEDICAL CARE IF:   You have pain that radiates from your back into your legs.  You develop new bowel or bladder control problems.  You have unusual weakness or numbness in your arms or legs.  You develop nausea or vomiting.  You develop abdominal pain.  You feel faint. Document Released: 12/01/2005 Document Revised: 06/01/2012 Document Reviewed: 04/04/2014 [employer] Patient Information 2015 [employer]. This information is not intended to replace advice given to you by your health care provider. Make sure you  discuss any questions you have with your health care provider.  

## 2015-04-16 NOTE — Progress Notes (Signed)
Subjective:    Patient ID: Beth Miranda, female    DOB: 10-26-1961, 54 y.o.   MRN: [id number]  Back Pain Pertinent negatives include no abdominal pain, chest pain, dysuria, fever or numbness.   Patient reports to the office for evaluation of right sided low back pain which has been bothering her intermittently for the past 3 weeks.  She reports that she initially felt it when she was moving a box at work.  She felt that it got a lot worse yesterday when she picked up some groceries.  She reports that she has had some radiation of the pain to the front.  She has been taking aleve for her pain with no relief.  She did take some flexeril which seemed to help but she ran out.  She has worsening pain leaning forward and trying to lift her right leg.  She denies loss of bowel or bladder, no saddle anesthesia, no shooting pain down her legs.  She has no history of cancer, no osteoporosis, no surgery history of her abdomen or back.     Review of Systems  Constitutional: Negative for fever and chills.  Respiratory: Negative for chest tightness and shortness of breath.   Cardiovascular: Negative for chest pain and palpitations.  Gastrointestinal: Negative for nausea, vomiting and abdominal pain.  Genitourinary: Negative for dysuria, urgency, frequency, hematuria and difficulty urinating.  Musculoskeletal: Positive for back pain. Negative for gait problem.  Neurological: Negative for numbness.       Objective:   Physical Exam  Constitutional: She is oriented to person, place, and time. She appears well-developed and well-nourished. No distress.  HENT:  Head: Normocephalic and atraumatic.  Mouth/Throat: Oropharynx is clear and moist. No oropharyngeal exudate.  Eyes: Conjunctivae and EOM are normal. Pupils are equal, round, and reactive to light. No scleral icterus.  Neck: Normal range of motion. Neck supple. No JVD present. No thyromegaly present.  Cardiovascular: Normal rate, regular rhythm,  normal heart sounds and intact distal pulses.  Exam reveals no gallop and no friction rub.   No murmur heard. Pulmonary/Chest: Effort normal and breath sounds normal. No respiratory distress. She has no wheezes. She has no rales. She exhibits no tenderness.  Abdominal: Soft. Bowel sounds are normal. She exhibits no distension and no mass. There is no tenderness. There is no rebound and no guarding.  Musculoskeletal:  Patient rises slowly from sitting to standing.  They walk without an antalgic gait.  There is no evidence of erythema, ecchymosis, or gross deformity.  There is tenderness to palpation over right lumbar paraspinal muscles.  Active ROM is limited due to pain with forward flexion.  Sensation to light touch is intact over all extremities.  Strength is symmetric and equal in all extremities.     Lymphadenopathy:    She has no cervical adenopathy.  Neurological: She is alert and oriented to person, place, and time. She has normal strength. No cranial nerve deficit or sensory deficit. Coordination normal.  Skin: Skin is warm and dry. She is not diaphoretic.  Psychiatric: She has a normal mood and affect. Her behavior is normal. Judgment and thought content normal.  Nursing note and vitals reviewed.         Assessment & Plan:    1. Right-sided low back pain without sciatica Likely muscle strain from poor lifting mechanism.  No evidence for neuro deficiency, no cauda equina red flags.  Return for cauda equina symptoms.  F/u prn  - diazepam (VALIUM) 5 MG  tablet; Take 1 tablet (5 mg total) by mouth every 8 (eight) hours as needed for anxiety.  Dispense: 30 tablet; Refill: 0 - predniSONE (DELTASONE) 10 MG tablet; Day 1 take 6 pills,Day 2 take 5 pills,Day 3 take 4 pills,Day 4 take 3 pills,Day 5 take 2 pills,Day 6 take 1 pill  Dispense: 21 tablet; Refill: 0 - naproxen sodium (ANAPROX) 275 MG tablet; Take 1 tablet (275 mg total) by mouth 2 (two) times daily with a meal.  Dispense: 30  tablet; Refill: 2 - traMADol (ULTRAM) 50 MG tablet; Take 1 tablet (50 mg total) by mouth every 6 (six) hours as needed.  Dispense: 20 tablet; Refill: 0

## 2015-04-24 ENCOUNTER — Other Ambulatory Visit: Payer: Self-pay | Admitting: Internal Medicine

## 2015-05-02 ENCOUNTER — Ambulatory Visit (INDEPENDENT_AMBULATORY_CARE_PROVIDER_SITE_OTHER): Payer: No Typology Code available for payment source | Admitting: Gynecology

## 2015-05-02 ENCOUNTER — Encounter: Payer: Self-pay | Admitting: Gynecology

## 2015-05-02 ENCOUNTER — Other Ambulatory Visit (HOSPITAL_COMMUNITY)
Admission: RE | Admit: 2015-05-02 | Discharge: 2015-05-02 | Disposition: A | Discharge status: Home / Self Care | Payer: No Typology Code available for payment source | Source: Ambulatory Visit | Attending: Gynecology | Admitting: Gynecology

## 2015-05-02 VITALS — BP 134/82 | Ht 63.75 in | Wt 139.0 lb

## 2015-05-02 DIAGNOSIS — Z79890 Hormone replacement therapy (postmenopausal): Secondary | ICD-10-CM

## 2015-05-02 DIAGNOSIS — Z780 Asymptomatic menopausal state: Secondary | ICD-10-CM

## 2015-05-02 DIAGNOSIS — Z01419 Encounter for gynecological examination (general) (routine) without abnormal findings: Secondary | ICD-10-CM | POA: Insufficient documentation

## 2015-05-02 DIAGNOSIS — Z8639 Personal history of other endocrine, nutritional and metabolic disease: Secondary | ICD-10-CM | POA: Insufficient documentation

## 2015-05-02 DIAGNOSIS — Z1151 Encounter for screening for human papillomavirus (HPV): Secondary | ICD-10-CM | POA: Insufficient documentation

## 2015-05-02 MED ORDER — PROGESTERONE MICRONIZED 200 MG PO CAPS: 200.0000 mg | ORAL_CAPSULE | Freq: Every day | ORAL | Status: DC

## 2015-05-02 MED ORDER — ESTRADIOL 1 MG PO TABS: 1.0000 mg | ORAL_TABLET | Freq: Every day | ORAL | Status: DC

## 2015-05-02 NOTE — Patient Instructions (Signed)

## 2015-05-02 NOTE — Progress Notes (Signed)
Beth Miranda 1961/12/12 [id number]   History:    54 y.o.  for annual gyn exam who is a new patient to the practice. Patient was previously being followed by another OB/GYN here in [facility] who 2 years ago when she began the menopause start her on [place] patient had questions on this form of HRT. She reports no vaginal bleeding her vasomotor symptoms have improved. Patient's vaccines are up-to-date. Patient denies any past history of any abnormal Pap smear. Her colonoscopy is up-to-date and she is on a 10 year recall. She has never had a bone density study in the past. She reports having had history vitamin D deficiency in the past. She also has hypothyroidism for which Dr. Marisue Brooklyn her PCP has been doing her blood work.  Patient has history of hereditary an idiopathic peripheral neuropathy and is borderline diabetic.  Past medical history,surgical history, family history and social history were all reviewed and documented in the EPIC chart.  Gynecologic History Patient's last menstrual period was 03/02/2013. Contraception: post menopausal status Last Pap: Approximate 2 years ago. Results were: normal Last mammogram: 2015. Results were: normal  Obstetric History OB History  Gravida Para Term Preterm AB SAB TAB Ectopic Multiple Living  0 0 0 0 0 0 0 0 0 0          ROS: A ROS was performed and pertinent positives and negatives are included in the history.  GENERAL: No fevers or chills. HEENT: No change in vision, no earache, sore throat or sinus congestion. NECK: No pain or stiffness. CARDIOVASCULAR: No chest pain or pressure. No palpitations. PULMONARY: No shortness of breath, cough or wheeze. GASTROINTESTINAL: No abdominal pain, nausea, vomiting or diarrhea, melena or bright red blood per rectum. GENITOURINARY: No urinary frequency, urgency, hesitancy or dysuria. MUSCULOSKELETAL: No joint or muscle pain, no back pain, no recent trauma. DERMATOLOGIC: No rash, no itching,  no lesions. ENDOCRINE: No polyuria, polydipsia, no heat or cold intolerance. No recent change in weight. HEMATOLOGICAL: No anemia or easy bruising or bleeding. NEUROLOGIC: No headache, seizures, numbness, tingling or weakness. PSYCHIATRIC: No depression, no loss of interest in normal activity or change in sleep pattern.     Exam: chaperone present  BP 134/82 mmHg  Ht 5' 3.75" (1.619 m)  Wt 139 lb (63.05 kg)  BMI 24.05 kg/m2  LMP 03/02/2013  Body mass index is 24.05 kg/(m^2).  General appearance : Well developed well nourished female. No acute distress HEENT: Eyes: no retinal hemorrhage or exudates,  Neck supple, trachea midline, no carotid bruits, no thyroidmegaly Lungs: Clear to auscultation, no rhonchi or wheezes, or rib retractions  Heart: Regular rate and rhythm, no murmurs or gallops Breast:Examined in sitting and supine position were symmetrical in appearance, no palpable masses or tenderness,  no skin retraction, no nipple inversion, no nipple discharge, no skin discoloration, no axillary or supraclavicular lymphadenopathy Abdomen: no palpable masses or tenderness, no rebound or guarding Extremities: no edema or skin discoloration or tenderness  Pelvic:  Bartholin, Urethra, Skene Glands: Within normal limits             Vagina: No gross lesions or discharge  Cervix: No gross lesions or discharge  Uterus  anteverted, normal size, shape and consistency, non-tender and mobile  Adnexa  Without masses or tenderness  Anus and perineum  normal   Rectovaginal  normal sphincter tone without palpated masses or tenderness             Hemoccult PCP provides  Assessment/Plan:  54 y.o. female for annual exam who was started on continuous HRT 2 years ago. We had a lengthy discussion on the women's health initiative study and the recommendations of cyclical HRT versus continuous to minimize risk of breast cancer. Also the lowest dose with a short amount of time. She is going to be  switched from Aldrich to the following regimen: Estradiol 1 mg by mouth daily with the addition of Prometrium 200 mg for 12 days of the month. We discussed importance of calcium vitamin D and regular exercise for osteoporosis prevention. She will schedule a bone density study here in our office in the next few weeks. Her PCP we'll be doing her blood work.   Terrance Mass MD, 4:05 PM 05/02/2015

## 2015-05-04 LAB — CYTOLOGY - PAP

## 2015-05-08 ENCOUNTER — Ambulatory Visit: Payer: No Typology Code available for payment source | Admitting: Neurology

## 2015-05-09 ENCOUNTER — Ambulatory Visit: Payer: No Typology Code available for payment source | Admitting: Neurology

## 2015-05-09 ENCOUNTER — Encounter: Payer: Self-pay | Admitting: Neurology

## 2015-05-09 ENCOUNTER — Ambulatory Visit (INDEPENDENT_AMBULATORY_CARE_PROVIDER_SITE_OTHER): Payer: No Typology Code available for payment source | Admitting: Neurology

## 2015-05-09 VITALS — BP 120/84 | HR 94 | Ht 64.0 in | Wt 140.1 lb

## 2015-05-09 DIAGNOSIS — G609 Hereditary and idiopathic neuropathy, unspecified: Secondary | ICD-10-CM | POA: Diagnosis not present

## 2015-05-09 NOTE — Progress Notes (Signed)
Follow-up Visit   Date: 05/09/2015    Beth Miranda MRN: [id number] DOB: [birth year]   Interim History: Beth Miranda is a 54 y.o. left-handed Caucasian female with GERD, hypothyroid, hyperlipidemia, Raynaud's disease, prediabetes, and vitamin D deficiency, and mixed connective tissue disease returning to the clinic for follow-up of bilateral toe numbness.  The patient was accompanied to the clinic by self.  History of present illness: Starting in October 2015, she developed numbness of her toes which has been constant since onset. It only involves the pads of her toes (right > left). No associated weakness, imbalance, or pain. She did not notice any triggers, exacerbating, or alleviating factors. She has noticed a slight tingling over there left lower leg, lasting a few seconds. In December, she went to her PCP for the symptoms and also low back pain. She underwent MRI lumbar spine which did not show any nerve impingement.   No family history of neuropathy.  UPDATE 05/09/2015:  She continues to have intermittent numbness of her toes. There is no burning, tingling, or painful symptoms.  Her labs indicated positive smith/RNP antibody and has known mixed connective tissue disease.  She has previously seen Dr. Jefm Bryant, rheumatologist, in 2003.  She denies any joint pain.  EMG performed did not show any evidence of neuropathy.   Medications:  Current Outpatient Prescriptions on File Prior to Visit  Medication Sig Dispense Refill  . aspirin 81 MG tablet Take 81 mg by mouth daily.    . Cetirizine HCl (ZYRTEC ALLERGY PO) Take 10 mg by mouth at bedtime.    . Cholecalciferol (VITAMIN D) 2000 UNITS tablet Take 2,000 Units by mouth 2 (two) times daily.    . Cyanocobalamin (VITAMIN B 12 PO) Take 1,000 mcg by mouth daily.    . diazepam (VALIUM) 5 MG tablet Take 1 tablet (5 mg total) by mouth every 8 (eight) hours as needed for anxiety. (Patient not taking: Reported on 05/02/2015) 30 tablet 0    . estradiol (ESTRACE) 1 MG tablet Take 1 tablet (1 mg total) by mouth daily. 90 tablet 4  . fish oil-omega-3 fatty acids 1000 MG capsule Take 1,200 mg by mouth 2 (two) times daily.    Marland Kitchen levothyroxine (SYNTHROID, LEVOTHROID) 50 MCG tablet Take 1 tablet (50 mcg total) by mouth daily. 90 tablet 99  . Magnesium 200 MG TABS Take 200 mg by mouth 2 (two) times daily.    . naproxen sodium (ANAPROX) 275 MG tablet Take 1 tablet (275 mg total) by mouth 2 (two) times daily with a meal. 30 tablet 2  . progesterone (PROMETRIUM) 200 MG capsule Take 1 capsule (200 mg total) by mouth daily. Take one tablet the first 12 days of the month 40 capsule 4  . ranitidine (ZANTAC) 300 MG tablet TAKE 1 TABLET BY MOUTH DAILY AT BEDTIME AS NEEDED FOR INDIGESTION. 30 tablet 3  . Red Yeast Rice Extract (RED YEAST RICE PO) Take 1,200 mg by mouth daily.    . traMADol (ULTRAM) 50 MG tablet Take 1 tablet (50 mg total) by mouth every 6 (six) hours as needed. (Patient not taking: Reported on 05/02/2015) 20 tablet 0   No current facility-administered medications on file prior to visit.    Allergies:  Allergies  Allergen Reactions  . Lamisil [Terbinafine Hcl] Hives  . Penicillins Hives  . Sulfa Antibiotics Hives    Review of Systems:  CONSTITUTIONAL: No fevers, chills, night sweats, or weight loss.  EYES: No visual changes or eye  pain ENT: No hearing changes.  No history of nose bleeds.   RESPIRATORY: No cough, wheezing and shortness of breath.   CARDIOVASCULAR: Negative for chest pain, and palpitations.   GI: Negative for abdominal discomfort, blood in stools or black stools.  No recent change in bowel habits.   GU:  No history of incontinence.   MUSCLOSKELETAL: No history of joint pain or swelling.  No myalgias.   SKIN: Negative for lesions, rash, and itching.   ENDOCRINE: Negative for cold or heat intolerance, polydipsia or goiter.   PSYCH:  No depression or anxiety symptoms.   NEURO: As Above.   Vital Signs:  BP  120/84 mmHg  Pulse 94  Ht 5\' 4"  (1.626 m)  Wt 140 lb 1 oz (63.532 kg)  BMI 24.03 kg/m2  SpO2 99%  LMP 03/02/2013  Neurological Exam: MENTAL STATUS including orientation to time, place, person, recent and remote memory, attention span and concentration, language, and fund of knowledge is normal.  Speech is not dysarthric.  CRANIAL NERVES:  Face is symmetric.   MOTOR:  Motor strength is 5/5 in all extremities.  No atrophy, fasciculations or abnormal movements.  No pronator drift.  Tone is normal.    MSRs:  Reflexes are 2+/4 throughout.  Down-going plantars.  SENSORY:  Pin prick mildly reduced over the right 5th toe and temperature reduced slightly over the dorsum of the left foot.  Vibration and light touch is intact. Rhomberg testing is negative.   COORDINATION/GAIT:  Normal finger-to- nose-finger and heel-to-shin.  Intact rapid alternating movements bilaterally.  Gait narrow based and stable.  Tandem gait intact.  Data: EMG 04/05/2015: This is a normal study of the lower extremities. In particular, there is no evidence of a generalized large fiber sensorimotor polyneuropathy or lumbosacral radiculopathy affecting the legs. A small fiber neuropathy cannot be excluded by this study.  Labs 03/02/2015:  Vitamin B1 31*, ANA 1:40 speckled, vitamin B6 19.1, vitamin B12 691, ENA positive for SM/RNP >8.0*, SPEP/UPEP with IFE no M protein, copper 98, ceruloplasmin 23  MRI lumbar spine wo contrast 01/01/2015: 1. Bilateral L5 pars defects without listhesis. 2. Minimal lumbar disc degeneration without stenosis.  Labs 01/22/2015: Heavy metal screen negative Labs 11/14/2014: HbA1c 5.8   IMPRESSION/PLAN: Beth Miranda is a delightful 54 year-old female returning for evaluation of bilateral toe numbness which started October 2015.  Her exam is possibly suggestive of an early and distal large fiber peripheral neuropathy; however, this was not present on her EMG which returned normal.  If neuropathy is  too early and mild, abnormalities may be too little to be seen on her electrodiagnostic testing.  Her vascular studies have also returned normal.  Laboratory testing indicated SN/RNP antibody positivity, but she has known mixed connective tissue disease, which she is asymptomatic from.    I also explained that since her symptom is predominately numbness, unfortunately, there is no effective treatment for numbness. If she develops painful paresthesias, neuralgesic medication can be started.  At this juncture, would see how her symptoms evolve over time.  She has no neurological restrictions and can resume her prior level of activity.  Return to clinic as needed    The duration of this appointment visit was 20 minutes of face-to-face time with the patient.  Greater than 50% of this time was spent in counseling, explanation of diagnosis, planning of further management, and coordination of care.   Thank you for allowing me to participate in patient's care.  If I can answer any  additional questions, I would be pleased to do so.    Sincerely,    Kaziyah Parkison K. Posey Pronto, DO

## 2015-05-09 NOTE — Patient Instructions (Signed)
Great to see you today OK for you to resume your exercises Return to clinic if symptoms worsen

## 2015-05-10 ENCOUNTER — Encounter: Payer: Self-pay | Admitting: Internal Medicine

## 2015-05-10 ENCOUNTER — Ambulatory Visit (INDEPENDENT_AMBULATORY_CARE_PROVIDER_SITE_OTHER): Payer: No Typology Code available for payment source | Admitting: Internal Medicine

## 2015-05-10 VITALS — BP 140/82 | HR 76 | Temp 97.7°F | Resp 16 | Ht 64.0 in | Wt 138.8 lb

## 2015-05-10 DIAGNOSIS — R0989 Other specified symptoms and signs involving the circulatory and respiratory systems: Secondary | ICD-10-CM

## 2015-05-10 DIAGNOSIS — R7303 Prediabetes: Secondary | ICD-10-CM

## 2015-05-10 DIAGNOSIS — Z111 Encounter for screening for respiratory tuberculosis: Secondary | ICD-10-CM | POA: Diagnosis not present

## 2015-05-10 DIAGNOSIS — Z79899 Other long term (current) drug therapy: Secondary | ICD-10-CM

## 2015-05-10 DIAGNOSIS — I10 Essential (primary) hypertension: Secondary | ICD-10-CM

## 2015-05-10 DIAGNOSIS — R7309 Other abnormal glucose: Secondary | ICD-10-CM

## 2015-05-10 DIAGNOSIS — E039 Hypothyroidism, unspecified: Secondary | ICD-10-CM

## 2015-05-10 DIAGNOSIS — R5383 Other fatigue: Secondary | ICD-10-CM

## 2015-05-10 DIAGNOSIS — Z1212 Encounter for screening for malignant neoplasm of rectum: Secondary | ICD-10-CM

## 2015-05-10 DIAGNOSIS — E559 Vitamin D deficiency, unspecified: Secondary | ICD-10-CM

## 2015-05-10 DIAGNOSIS — E785 Hyperlipidemia, unspecified: Secondary | ICD-10-CM | POA: Diagnosis not present

## 2015-05-10 DIAGNOSIS — K219 Gastro-esophageal reflux disease without esophagitis: Secondary | ICD-10-CM

## 2015-05-10 LAB — CBC WITH DIFFERENTIAL/PLATELET
Basophils Absolute: 0 10*3/uL (ref 0.0–0.1)
Basophils Relative: 1 % (ref 0–1)
Eosinophils Absolute: 0.1 10*3/uL (ref 0.0–0.7)
Eosinophils Relative: 4 % (ref 0–5)
HCT: 42.2 % (ref 36.0–46.0)
Hemoglobin: 14.2 g/dL (ref 12.0–15.0)
Lymphocytes Relative: 38 % (ref 12–46)
Lymphs Abs: 1.2 10*3/uL (ref 0.7–4.0)
MCH: 29.4 pg (ref 26.0–34.0)
MCHC: 33.6 g/dL (ref 30.0–36.0)
MCV: 87.4 fL (ref 78.0–100.0)
MPV: 9.4 fL (ref 8.6–12.4)
Monocytes Absolute: 0.4 10*3/uL (ref 0.1–1.0)
Monocytes Relative: 12 % (ref 3–12)
Neutro Abs: 1.4 10*3/uL — ABNORMAL LOW (ref 1.7–7.7)
Neutrophils Relative %: 45 % (ref 43–77)
Platelets: 214 10*3/uL (ref 150–400)
RBC: 4.83 MIL/uL (ref 3.87–5.11)
RDW: 13.5 % (ref 11.5–15.5)
WBC: 3.2 10*3/uL — ABNORMAL LOW (ref 4.0–10.5)

## 2015-05-10 LAB — HEPATIC FUNCTION PANEL
ALT: 10 U/L (ref 0–35)
AST: 18 U/L (ref 0–37)
Albumin: 4.4 g/dL (ref 3.5–5.2)
Alkaline Phosphatase: 30 U/L — ABNORMAL LOW (ref 39–117)
Bilirubin, Direct: 0.1 mg/dL (ref 0.0–0.3)
Indirect Bilirubin: 0.3 mg/dL (ref 0.2–1.2)
Total Bilirubin: 0.4 mg/dL (ref 0.2–1.2)
Total Protein: 7.1 g/dL (ref 6.0–8.3)

## 2015-05-10 LAB — BASIC METABOLIC PANEL WITH GFR
BUN: 15 mg/dL (ref 6–23)
CO2: 29 mEq/L (ref 19–32)
Calcium: 9.9 mg/dL (ref 8.4–10.5)
Chloride: 103 mEq/L (ref 96–112)
Creat: 0.83 mg/dL (ref 0.50–1.10)
GFR, Est African American: >89 mL/min
GFR, Est Non African American: 81 mL/min
Glucose, Bld: 81 mg/dL (ref 70–99)
Potassium: 4.8 mEq/L (ref 3.5–5.3)
Sodium: 141 mEq/L (ref 135–145)

## 2015-05-10 LAB — LIPID PANEL
Cholesterol: 179 mg/dL (ref 0–200)
HDL: 56 mg/dL (ref >=46)
LDL Cholesterol: 108 mg/dL — ABNORMAL HIGH (ref 0–99)
Total CHOL/HDL Ratio: 3.2 Ratio
Triglycerides: 75 mg/dL (ref <150)
VLDL: 15 mg/dL (ref 0–40)

## 2015-05-10 LAB — IRON AND TIBC
%SAT: 20 % (ref 20–55)
Iron: 57 ug/dL (ref 42–145)
TIBC: 287 ug/dL (ref 250–470)
UIBC: 230 ug/dL (ref 125–400)

## 2015-05-10 LAB — MAGNESIUM: Magnesium: 2.3 mg/dL (ref 1.5–2.5)

## 2015-05-10 NOTE — Patient Instructions (Signed)
Recommend Adult Low dose Aspirin or baby Aspirin 81 mg daily   To reduce risk of Colon Cancer 20 %,   Skin Cancer 26 % ,   Melanoma 46%   and   Pancreatic cancer 60%  ++++++++++++++++++  Vitamin D goal is between 70-100.   Please make sure that you are taking your Vitamin D as directed.   It is very important as a natural anti-inflammatory   helping hair, skin, and nails, as well as reducing stroke and heart attack risk.   It helps your bones and helps with mood.  It also decreases numerous cancer risks so please take it as directed.   Low Vit D is associated with a 200-300% higher risk for CANCER   and 200-300% higher risk for HEART   ATTACK  &  STROKE.    ......................................  It is also associated with higher death rate at younger ages,   autoimmune diseases like Rheumatoid arthritis, Lupus, Multiple Sclerosis.     Also many other serious conditions, like depression, Alzheimer's  Dementia, infertility, muscle aches, fatigue, fibromyalgia - just to name a few.  +++++++++++++++++++    Recommend the book "The END of DIETING" by Dr Joel Fuhrman   & the book "The END of DIABETES " by Dr Joel Fuhrman  At [employer] - get book & Audio CD's     Being diabetic has a  300% increased risk for heart attack, stroke, cancer, and alzheimer- type vascular dementia. It is very important that you work harder with diet by avoiding all foods that are white. Avoid white rice (brown & wild rice is OK), white potatoes (sweetpotatoes in moderation is OK), White bread or wheat bread or anything made out of white flour like bagels, donuts, rolls, buns, biscuits, cakes, pastries, cookies, pizza crust, and pasta (made from white flour & egg whites) - vegetarian pasta or spinach or wheat pasta is OK. Multigrain breads like Arnold's or [employer], or multigrain sandwich thins or flatbreads.  Diet, exercise and weight loss can reverse and cure diabetes in the early  stages.  Diet, exercise and weight loss is very important in the control and prevention of complications of diabetes which affects every system in your body, ie. Brain - dementia/stroke, eyes - glaucoma/blindness, heart - heart attack/heart failure, kidneys - dialysis, stomach - gastric paralysis, intestines - malabsorption, nerves - severe painful neuritis, circulation - gangrene & loss of a leg(s), and finally cancer and Alzheimers.    I recommend avoid fried & greasy foods,  sweets/candy, white rice (brown or wild rice or Quinoa is OK), white potatoes (sweet potatoes are OK) - anything made from white flour - bagels, doughnuts, rolls, buns, biscuits,white and wheat breads, pizza crust and traditional pasta made of white flour & egg white(vegetarian pasta or spinach or wheat pasta is OK).  Multi-grain bread is OK - like multi-grain flat bread or sandwich thins. Avoid alcohol in excess. Exercise is also important.    Eat all the vegetables you want - avoid meat, especially red meat and dairy - especially cheese.  Cheese is the most concentrated form of trans-fats which is the worst thing to clog up our arteries. Veggie cheese is OK which can be found in the fresh produce section at [employer] or [employer] or [employer]  ++++++++++++++++++++++++++  Preventive Care for Adults  A healthy lifestyle and preventive care can promote health and wellness. Preventive health guidelines for women include the following key practices.  A routine yearly physical is   a good way to check with your health care provider about your health and preventive screening. It is a chance to share any concerns and updates on your health and to receive a thorough exam.  Visit your dentist for a routine exam and preventive care every 6 months. Brush your teeth twice a day and floss once a day. Good oral hygiene prevents tooth decay and gum disease.  The frequency of eye exams is based on your age, health, family medical  history, use of contact lenses, and other factors. Follow your health care provider's recommendations for frequency of eye exams.  Eat a healthy diet. Foods like vegetables, fruits, whole grains, low-fat dairy products, and lean protein foods contain the nutrients you need without too many calories. Decrease your intake of foods high in solid fats, added sugars, and salt. Eat the right amount of calories for you.Get information about a proper diet from your health care provider, if necessary.  Regular physical exercise is one of the most important things you can do for your health. Most adults should get at least 150 minutes of moderate-intensity exercise (any activity that increases your heart rate and causes you to sweat) each week. In addition, most adults need muscle-strengthening exercises on 2 or more days a week.  Maintain a healthy weight. The body mass index (BMI) is a screening tool to identify possible weight problems. It provides an estimate of body fat based on height and weight. Your health care provider can find your BMI and can help you achieve or maintain a healthy weight.For adults 20 years and older:  A BMI below 18.5 is considered underweight.  A BMI of 18.5 to 24.9 is normal.  A BMI of 25 to 29.9 is considered overweight.  A BMI of 30 and above is considered obese.  Maintain normal blood lipids and cholesterol levels by exercising and minimizing your intake of saturated fat. Eat a balanced diet with plenty of fruit and vegetables. Blood tests for lipids and cholesterol should begin at age 69 and be repeated every 5 years. If your lipid or cholesterol levels are high, you are over 50, or you are at high risk for heart disease, you may need your cholesterol levels checked more frequently.Ongoing high lipid and cholesterol levels should be treated with medicines if diet and exercise are not working.  If you smoke, find out from your health care provider how to quit. If you do  not use tobacco, do not start.  Lung cancer screening is recommended for adults aged 53-80 years who are at high risk for developing lung cancer because of a history of smoking. A yearly low-dose CT scan of the lungs is recommended for people who have at least a 30-pack-year history of smoking and are a current smoker or have quit within the past 15 years. A pack year of smoking is smoking an average of 1 pack of cigarettes a day for 1 year (for example: 1 pack a day for 30 years or 2 packs a day for 15 years). Yearly screening should continue until the smoker has stopped smoking for at least 15 years. Yearly screening should be stopped for people who develop a health problem that would prevent them from having lung cancer treatment.  High blood pressure causes heart disease and increases the risk of stroke. Your blood pressure should be checked at least every 1 to 2 years. Ongoing high blood pressure should be treated with medicines if weight loss and exercise do not work.  If you are 61-95 years old, ask your health care provider if you should take aspirin to prevent strokes.  Diabetes screening involves taking a blood sample to check your fasting blood sugar level. This should be done once every 3 years, after age 37, if you are within normal weight and without risk factors for diabetes. Testing should be considered at a younger age or be carried out more frequently if you are overweight and have at least 1 risk factor for diabetes.  Breast cancer screening is essential preventive care for women. You should practice "breast self-awareness." This means understanding the normal appearance and feel of your breasts and may include breast self-examination. Any changes detected, no matter how small, should be reported to a health care provider. Women in their 86s and 30s should have a clinical breast exam (CBE) by a health care provider as part of a regular health exam every 1 to 3 years. After age 91, women  should have a CBE every year. Starting at age 41, women should consider having a mammogram (breast X-ray test) every year. Women who have a family history of breast cancer should talk to their health care provider about genetic screening. Women at a high risk of breast cancer should talk to their health care providers about having an MRI and a mammogram every year.  Breast cancer gene (BRCA)-related cancer risk assessment is recommended for women who have family members with BRCA-related cancers. BRCA-related cancers include breast, ovarian, tubal, and peritoneal cancers. Having family members with these cancers may be associated with an increased risk for harmful changes (mutations) in the breast cancer genes BRCA1 and BRCA2. Results of the assessment will determine the need for genetic counseling and BRCA1 and BRCA2 testing.  Routine pelvic exams to screen for cancer are no longer recommended for nonpregnant women who are considered low risk for cancer of the pelvic organs (ovaries, uterus, and vagina) and who do not have symptoms. Ask your health care provider if a screening pelvic exam is right for you.  If you have had past treatment for cervical cancer or a condition that could lead to cancer, you need Pap tests and screening for cancer for at least 20 years after your treatment. If Pap tests have been discontinued, your risk factors (such as having a new sexual partner) need to be reassessed to determine if screening should be resumed. Some women have medical problems that increase the chance of getting cervical cancer. In these cases, your health care provider may recommend more frequent screening and Pap tests.  Colorectal cancer can be detected and often prevented. Most routine colorectal cancer screening begins at the age of 67 years and continues through age 35 years. However, your health care provider may recommend screening at an earlier age if you have risk factors for colon cancer. On a  yearly basis, your health care provider may provide home test kits to check for hidden blood in the stool. Use of a small camera at the end of a tube, to directly examine the colon (sigmoidoscopy or colonoscopy), can detect the earliest forms of colorectal cancer. Talk to your health care provider about this at age 34, when routine screening begins. Direct exam of the colon should be repeated every 5-10 years through age 90 years, unless early forms of pre-cancerous polyps or small growths are found.  Hepatitis C blood testing is recommended for all people born from [birth year] through 1965 and any individual with known risks for hepatitis C.  Pra  Osteoporosis is a disease in which the bones lose minerals and strength with aging. This can result in serious bone fractures or breaks. The risk of osteoporosis can be identified using a bone density scan. Women ages 54 years and over and women at risk for fractures or osteoporosis should discuss screening with their health care providers. Ask your health care provider whether you should take a calcium supplement or vitamin D to reduce the rate of osteoporosis.  Menopause can be associated with physical symptoms and risks. Hormone replacement therapy is available to decrease symptoms and risks. You should talk to your health care provider about whether hormone replacement therapy is right for you.  Use sunscreen. Apply sunscreen liberally and repeatedly throughout the day. You should seek shade when your shadow is shorter than you. Protect yourself by wearing long sleeves, pants, a wide-brimmed hat, and sunglasses year round, whenever you are outdoors.  Once a month, do a whole body skin exam, using a mirror to look at the skin on your back. Tell your health care provider of new moles, moles that have irregular borders, moles that are larger than a pencil eraser, or moles that have changed in shape or color.  Stay current with required vaccines  (immunizations).  Influenza vaccine. All adults should be immunized every year.  Tetanus, diphtheria, and acellular pertussis (Td, Tdap) vaccine. Pregnant women should receive 1 dose of Tdap vaccine during each pregnancy. The dose should be obtained regardless of the length of time since the last dose. Immunization is preferred during the 27th-36th week of gestation. An adult who has not previously received Tdap or who does not know her vaccine status should receive 1 dose of Tdap. This initial dose should be followed by tetanus and diphtheria toxoids (Td) booster doses every 10 years. Adults with an unknown or incomplete history of completing a 3-dose immunization series with Td-containing vaccines should begin or complete a primary immunization series including a Tdap dose. Adults should receive a Td booster every 10 years.  Varicella vaccine. An adult without evidence of immunity to varicella should receive 2 doses or a second dose if she has previously received 1 dose. Pregnant females who do not have evidence of immunity should receive the first dose after pregnancy. This first dose should be obtained before leaving the health care facility. The second dose should be obtained 4-8 weeks after the first dose.  Human papillomavirus (HPV) vaccine. Females aged 13-26 years who have not received the vaccine previously should obtain the 3-dose series. The vaccine is not recommended for use in pregnant females. However, pregnancy testing is not needed before receiving a dose. If a female is found to be pregnant after receiving a dose, no treatment is needed. In that case, the remaining doses should be delayed until after the pregnancy. Immunization is recommended for any person with an immunocompromised condition through the age of 28 years if she did not get any or all doses earlier. During the 3-dose series, the second dose should be obtained 4-8 weeks after the first dose. The third dose should be obtained  24 weeks after the first dose and 16 weeks after the second dose.  Zoster vaccine. One dose is recommended for adults aged 27 years or older unless certain conditions are present.  Measles, mumps, and rubella (MMR) vaccine. Adults born before [birth year] generally are considered immune to measles and mumps. Adults born in [birth year] or later should have 1 or more doses of MMR vaccine unless there is a  contraindication to the vaccine or there is laboratory evidence of immunity to each of the three diseases. A routine second dose of MMR vaccine should be obtained at least 28 days after the first dose for students attending postsecondary schools, health care workers, or international travelers. People who received inactivated measles vaccine or an unknown type of measles vaccine during 1963-1967 should receive 2 doses of MMR vaccine. People who received inactivated mumps vaccine or an unknown type of mumps vaccine before 1979 and are at high risk for mumps infection should consider immunization with 2 doses of MMR vaccine. For females of childbearing age, rubella immunity should be determined. If there is no evidence of immunity, females who are not pregnant should be vaccinated. If there is no evidence of immunity, females who are pregnant should delay immunization until after pregnancy. Unvaccinated health care workers born before [birth year] who lack laboratory evidence of measles, mumps, or rubella immunity or laboratory confirmation of disease should consider measles and mumps immunization with 2 doses of MMR vaccine or rubella immunization with 1 dose of MMR vaccine.  Pneumococcal 13-valent conjugate (PCV13) vaccine. When indicated, a person who is uncertain of her immunization history and has no record of immunization should receive the PCV13 vaccine. An adult aged 3 years or older who has certain medical conditions and has not been previously immunized should receive 1 dose of PCV13 vaccine. This PCV13 should be followed  with a dose of pneumococcal polysaccharide (PPSV23) vaccine. The PPSV23 vaccine dose should be obtained at least 8 weeks after the dose of PCV13 vaccine. An adult aged 49 years or older who has certain medical conditions and previously received 1 or more doses of PPSV23 vaccine should receive 1 dose of PCV13. The PCV13 vaccine dose should be obtained 1 or more years after the last PPSV23 vaccine dose.    Pneumococcal polysaccharide (PPSV23) vaccine. When PCV13 is also indicated, PCV13 should be obtained first. All adults aged 81 years and older should be immunized. An adult younger than age 19 years who has certain medical conditions should be immunized. Any person who resides in a nursing home or long-term care facility should be immunized. An adult smoker should be immunized. People with an immunocompromised condition and certain other conditions should receive both PCV13 and PPSV23 vaccines. People with human immunodeficiency virus (HIV) infection should be immunized as soon as possible after diagnosis. Immunization during chemotherapy or radiation therapy should be avoided. Routine use of PPSV23 vaccine is not recommended for American Indians, [place] Natives, or people younger than 65 years unless there are medical conditions that require PPSV23 vaccine. When indicated, people who have unknown immunization and have no record of immunization should receive PPSV23 vaccine. One-time revaccination 5 years after the first dose of PPSV23 is recommended for people aged 19-64 years who have chronic kidney failure, nephrotic syndrome, asplenia, or immunocompromised conditions. People who received 1-2 doses of PPSV23 before age 50 years should receive another dose of PPSV23 vaccine at age 57 years or later if at least 5 years have passed since the previous dose. Doses of PPSV23 are not needed for people immunized with PPSV23 at or after age 48 years.  Preventive Services / Frequency   Ages 72 to 22 years  Blood  pressure check.  Lipid and cholesterol check.  Lung cancer screening. / Every year if you are aged 50-80 years and have a 30-pack-year history of smoking and currently smoke or have quit within the past 15 years. Yearly screening is stopped  once you have quit smoking for at least 15 years or develop a health problem that would prevent you from having lung cancer treatment.  Clinical breast exam.** / Every year after age 33 years.  BRCA-related cancer risk assessment.** / For women who have family members with a BRCA-related cancer (breast, ovarian, tubal, or peritoneal cancers).  Mammogram.** / Every year beginning at age 49 years and continuing for as long as you are in good health. Consult with your health care provider.  Pap test.** / Every 3 years starting at age 28 years through age 18 or 69 years with a history of 3 consecutive normal Pap tests.  HPV screening.** / Every 3 years from ages 65 years through ages 59 to 78 years with a history of 3 consecutive normal Pap tests.  Fecal occult blood test (FOBT) of stool. / Every year beginning at age 2 years and continuing until age 41 years. You may not need to do this test if you get a colonoscopy every 10 years.  Flexible sigmoidoscopy or colonoscopy.** / Every 5 years for a flexible sigmoidoscopy or every 10 years for a colonoscopy beginning at age 23 years and continuing until age 62 years.  Hepatitis C blood test.** / For all people born from [birth year] through 1964 and any individual with known risks for hepatitis C.  Skin self-exam. / Monthly.  Influenza vaccine. / Every year.  Tetanus, diphtheria, and acellular pertussis (Tdap/Td) vaccine.** / Consult your health care provider. Pregnant women should receive 1 dose of Tdap vaccine during each pregnancy. 1 dose of Td every 10 years.  Varicella vaccine.** / Consult your health care provider. Pregnant females who do not have evidence of immunity should receive the first dose after  pregnancy.  Zoster vaccine.** / 1 dose for adults aged 21 years or older.  Pneumococcal 13-valent conjugate (PCV13) vaccine.** / Consult your health care provider.  Pneumococcal polysaccharide (PPSV23) vaccine.** / 1 to 2 doses if you smoke cigarettes or if you have certain conditions.  Meningococcal vaccine.** / Consult your health care provider.  Hepatitis A vaccine.** / Consult your health care provider.  Hepatitis B vaccine.** / Consult your health care provider. Screening for abdominal aortic aneurysm (AAA)  by ultrasound is recommended for people over 50 who have history of high blood pressure or who are current or former smokers.

## 2015-05-10 NOTE — Progress Notes (Signed)
Patient ID: Beth Miranda, female   DOB: 1961-09-08, 54 y.o.   MRN: [id number]  Annual Comprehensive Examination  This very nice 54 y.o. MWW presents for complete physical.  Patient has been followed for labile  HTN, Prediabetes, Hyperlipidemia, and Vitamin D Deficiency. Patient does have very remote hx/o Mixed Connective Tissue Disease (MCTD). Patient has had recent neg evaluation of lower extremity paresthesias including neg labs, neg heavy metal blood and urine screens and just completed neg PNCV's after neuro consultation. In addition she also had recent neg vascular evaluation of the LE.   Labile  HTN predates since 2006 and has been monitored expectantly. Patient's BP has been controlled at home and patient denies any cardiac symptoms as chest pain, palpitations, shortness of breath, dizziness or ankle swelling. Today's BP: 140/82 mmHg    Patient's hyperlipidemia is controlled with diet and medications. Patient denies myalgias or other medication SE's. Last lipids were at goal - Chol 157, HDL 59 , LDL 89 and Trig 43.    Patient has prediabetes predating since 2012 with a slightly elevated A1c 5.9% and patient denies reactive hypoglycemic symptoms, visual blurring or diabetic polys. Last A1c was  5.8% on 11/14/2014.   Finally, patient has history of Vitamin D Deficiency and last Vitamin D was 26 in May 2015.      Medication Sig  . aspirin 81 MG tablet Take 81 mg by mouth daily.  . Cetirizine  Take 10 mg by mouth at bedtime.  Marland Kitchen VITAMIN D 2000 UNITS tablet Take 2,000 Units by mouth 2 (two) times daily.  . Cyanocobalamin (VITAMIN B 12 PO) Take 1,000 mcg by mouth daily.  Marland Kitchen estradiol (ESTRACE) 1 MG tablet Take 1 tablet (1 mg total) by mouth daily.  Marland Kitchen estradiol-norethindrone (ACTIVELLA) 1-0.5 MG per tablet   . Levothyroxine 50 MCG tablet Take 1 tablet (50 mcg total) by mouth daily.  . Magnesium 200 MG TABS Take 200 mg by mouth 2 (two) times daily.  . naproxen  275 MG tablet Take 1 tablet (275 mg  total) by mouth 2 (two) times daily with a meal.  . progesterone (PROMETRIUM) 200 MG  Take 1 capsule (200 mg total) by mouth daily. Take one tablet the first 12 days of the month  . ranitidine (ZANTAC) 300 MG tablet TAKE 1 TABLET BY MOUTH DAILY AT BEDTIME AS NEEDED FOR INDIGESTION.  . Red Yeast Rice Extract  Take 1,200 mg by mouth daily.  . diazepam  5 MG tablet Take 1 tablet (5 mg total) by mouth every 8 (eight) hours as needed for anxiety. (Patient not taking: Reported on 05/02/2015)  . fish oil 1000 MG capsule Take 1,200 mg by mouth 2 (two) times daily.  . traMADol  50 MG tablet Take 1 tablet (50 mg total) by mouth every 6 (six) hours as needed. (Patient not taking: Reported on 05/02/2015)   Allergies  Allergen Reactions  . Lamisil [Terbinafine Hcl] Hives  . Penicillins Hives  . Sulfa Antibiotics Hives   Past Medical History  Diagnosis Date  . Raynaud's disease   . Labile hypertension   . GERD (gastroesophageal reflux disease)   . Thyroid disease     hypothyroid  . Allergy     seasonal  . Hyperlipidemia   . Prediabetes   . Vitamin D deficiency    Health Maintenance  Topic Date Due  . MAMMOGRAM  04/01/2015  . INFLUENZA VACCINE  07/16/2015  . TETANUS/TDAP  01/06/2017  . PAP SMEAR  05/01/2018  .  COLONOSCOPY  05/28/2022  . HIV Screening  Completed   Immunization History  Administered Date(s) Administered  . PPD Test 05/09/2014  . Pneumococcal-Unspecified 12/15/1992  . Td 01/06/2007   Past Surgical History  Procedure Laterality Date  . Tonsillectomy     Family History  Problem Relation Age of Onset  . Pancreatic cancer Paternal Grandmother   . Arthritis Mother     Living  . COPD Mother   . Asthma Mother   . Hypertension Father     Living  . Diabetes Father   . Kidney disease Father     Stones  . Hyperlipidemia Father   . Other Brother     Deceased   History  Substance Use Topics  . Smoking status: Never Smoker   . Smokeless tobacco: Never Used  . Alcohol  Use: 0.6 oz/week    1 Standard drinks or equivalent per week     Comment: 2 glasses liquor per week    ROS Constitutional: Denies fever, chills, weight loss/gain, headaches, insomnia,  night sweats, and change in appetite. Does c/o fatigue. Eyes: Denies redness, blurred vision, diplopia, discharge, itchy, watery eyes.  ENT: Denies discharge, congestion, post nasal drip, epistaxis, sore throat, earache, hearing loss, dental pain, Tinnitus, Vertigo, Sinus pain, snoring.  Cardio: Denies chest pain, palpitations, irregular heartbeat, syncope, dyspnea, diaphoresis, orthopnea, PND, claudication, edema Respiratory: denies cough, dyspnea, DOE, pleurisy, hoarseness, laryngitis, wheezing.  Gastrointestinal: Denies dysphagia, heartburn, reflux, water brash, pain, cramps, nausea, vomiting, bloating, diarrhea, constipation, hematemesis, melena, hematochezia, jaundice, hemorrhoids Genitourinary: Denies dysuria, frequency, urgency, nocturia, hesitancy, discharge, hematuria, flank pain Breast: Breast lumps, nipple discharge, bleeding.  Musculoskeletal: Denies arthralgia, myalgia, stiffness, Jt. Swelling, pain, limp, and strain/sprain. Denies falls. Skin: Denies puritis, rash, hives, warts, acne, eczema, changing in skin lesion Neuro: No weakness, tremor, incoordination, spasms, paresthesia, pain Psychiatric: Denies confusion, memory loss, sensory loss. Denies Depression. Endocrine: Denies change in weight, skin, hair change, nocturia, and paresthesia, diabetic polys, visual blurring, hyper / hypo glycemic episodes.  Heme/Lymph: No excessive bleeding, bruising, enlarged lymph nodes.  Physical Exam  BP 140/82 mmHg  Pulse 76  Temp(Src) 97.7 F (36.5 C)  Resp 16  Ht 5\' 4"  (1.626 m)  Wt 138 lb 12.8 oz (62.959 kg)  BMI 23.81 kg/m2  LMP 03/02/2013  General Appearance: Well nourished and in no apparent distress. Eyes: PERRLA, EOMs, conjunctiva no swelling or erythema, normal fundi and vessels. Sinuses: No  frontal/maxillary tenderness ENT/Mouth: EACs patent / TMs  nl. Nares clear without erythema, swelling, mucoid exudates. Oral hygiene is good. No erythema, swelling, or exudate. Tongue normal, non-obstructing. Tonsils not swollen or erythematous. Hearing normal.  Neck: Supple, thyroid normal. No bruits, nodes or JVD. Respiratory: Respiratory effort normal.  BS equal and clear bilateral without rales, rhonci, wheezing or stridor. Cardio: Heart sounds are normal with regular rate and rhythm and no murmurs, rubs or gallops. Peripheral pulses are normal and equal bilaterally without edema. No aortic or femoral bruits. Chest: symmetric with normal excursions and percussion. Breasts: Symmetric, without lumps, nipple discharge, retractions, or fibrocystic changes.  Abdomen: Flat, soft, with bowl sounds. Nontender, no guarding, rebound, hernias, masses, or organomegaly.  Lymphatics: Non tender without lymphadenopathy.  Genitourinary:  Musculoskeletal: Full ROM all peripheral extremities, joint stability, 5/5 strength, and normal gait. Skin: Warm and dry without rashes, lesions, cyanosis, clubbing or  ecchymosis.  Neuro: Cranial nerves intact, reflexes equal bilaterally. Normal muscle tone, no cerebellar symptoms. Sensation intact.  Pysch: Awake and oriented X 3, normal affect, Insight and  Judgment appropriate.   Assessment and Plan  1. Labile hypertension  - Microalbumin / creatinine urine ratio - EKG 12-Lead - [place], RETROPERITNL ABD,  LTD - TSH  2. Hyperlipidemia  - Lipid panel  3. Prediabetes  - Hemoglobin A1c - Insulin, random  4. Vitamin D deficiency  - Vit D  25 hydroxy (rtn osteoporosis monitoring)  5. Hypothyroidism, unspecified hypothyroidism type   6. Gastroesophageal reflux disease, esophagitis presence not specified   7. Screening for rectal cancer  - POC Hemoccult Bld/Stl (3-Cd Home Screen); Future  8. Other fatigue  - Vitamin B12 - Iron and TIBC  9. Medication  management  - Urine Microscopic - CBC with Differential/Platelet - BASIC METABOLIC PANEL WITH GFR - Hepatic function panel - Magnesium  10. Screening examination for pulmonary tuberculosis  - PPD   Continue prudent diet as discussed, weight control, BP monitoring, regular exercise, and medications. Discussed med's effects and SE's. Screening labs and tests as requested with regular follow-up as recommended.  Over 40 minutes of exam, counseling, chart review was performed.

## 2015-05-11 LAB — VITAMIN D 25 HYDROXY (VIT D DEFICIENCY, FRACTURES): Vit D, 25-Hydroxy: 68 ng/mL (ref 30–100)

## 2015-05-11 LAB — URINALYSIS, MICROSCOPIC ONLY
Bacteria, UA: NONE SEEN
Casts: NONE SEEN
Crystals: NONE SEEN
Squamous Epithelial / LPF: NONE SEEN

## 2015-05-11 LAB — MICROALBUMIN / CREATININE URINE RATIO
Creatinine, Urine: 36.9 mg/dL
Microalb, Ur: <0.2 mg/dL (ref <2.0)

## 2015-05-11 LAB — INSULIN, RANDOM: Insulin: 3.6 u[IU]/mL (ref 2.0–19.6)

## 2015-05-11 LAB — HEMOGLOBIN A1C
Hgb A1c MFr Bld: 5.9 % — ABNORMAL HIGH (ref <5.7)
Mean Plasma Glucose: 123 mg/dL — ABNORMAL HIGH (ref <117)

## 2015-05-11 LAB — VITAMIN B12: Vitamin B-12: 1560 pg/mL — ABNORMAL HIGH (ref 211–911)

## 2015-05-11 LAB — TSH: TSH: 1.649 u[IU]/mL (ref 0.350–4.500)

## 2015-05-15 ENCOUNTER — Other Ambulatory Visit: Payer: Self-pay | Admitting: Internal Medicine

## 2015-05-15 ENCOUNTER — Encounter: Payer: Self-pay | Admitting: Internal Medicine

## 2015-05-15 DIAGNOSIS — Z1239 Encounter for other screening for malignant neoplasm of breast: Secondary | ICD-10-CM

## 2015-05-22 ENCOUNTER — Ambulatory Visit (HOSPITAL_COMMUNITY)
Admission: RE | Admit: 2015-05-22 | Discharge: 2015-05-22 | Disposition: A | Discharge status: Home / Self Care | Payer: No Typology Code available for payment source | Source: Ambulatory Visit | Attending: Internal Medicine | Admitting: Internal Medicine

## 2015-05-22 DIAGNOSIS — Z1239 Encounter for other screening for malignant neoplasm of breast: Secondary | ICD-10-CM

## 2015-05-22 DIAGNOSIS — Z1231 Encounter for screening mammogram for malignant neoplasm of breast: Secondary | ICD-10-CM | POA: Diagnosis not present

## 2015-05-23 ENCOUNTER — Other Ambulatory Visit: Payer: Self-pay | Admitting: Internal Medicine

## 2015-05-28 ENCOUNTER — Ambulatory Visit (INDEPENDENT_AMBULATORY_CARE_PROVIDER_SITE_OTHER): Payer: No Typology Code available for payment source

## 2015-05-28 ENCOUNTER — Other Ambulatory Visit: Payer: Self-pay | Admitting: Gynecology

## 2015-05-28 DIAGNOSIS — Z13820 Encounter for screening for osteoporosis: Secondary | ICD-10-CM

## 2015-05-28 DIAGNOSIS — Z8639 Personal history of other endocrine, nutritional and metabolic disease: Secondary | ICD-10-CM

## 2015-05-28 DIAGNOSIS — Z79890 Hormone replacement therapy (postmenopausal): Secondary | ICD-10-CM | POA: Diagnosis not present

## 2015-05-28 DIAGNOSIS — M8580 Other specified disorders of bone density and structure, unspecified site: Secondary | ICD-10-CM

## 2015-06-12 ENCOUNTER — Other Ambulatory Visit (INDEPENDENT_AMBULATORY_CARE_PROVIDER_SITE_OTHER): Payer: No Typology Code available for payment source

## 2015-06-12 DIAGNOSIS — Z1212 Encounter for screening for malignant neoplasm of rectum: Secondary | ICD-10-CM

## 2015-06-12 LAB — POC HEMOCCULT BLD/STL (HOME/3-CARD/SCREEN)
Card #2 Fecal Occult Blod, POC: NEGATIVE
Card #3 Fecal Occult Blood, POC: NEGATIVE
Fecal Occult Blood, POC: NEGATIVE

## 2015-07-02 ENCOUNTER — Telehealth: Payer: Self-pay

## 2015-07-02 ENCOUNTER — Other Ambulatory Visit: Payer: Self-pay | Admitting: Gynecology

## 2015-07-02 MED ORDER — ESTRADIOL 2 MG PO TABS: 2.0000 mg | ORAL_TABLET | Freq: Every day | ORAL | Status: DC

## 2015-07-02 NOTE — Telephone Encounter (Signed)
Was a new patient in May. You started her on Estrace 1 mg and Prometrium 200 D1-12.  She said it has brought some relief but she is still have a terrible time at night. She said most of her problem is nightsweats.  She has a few hotflashes during the day but they are manageable. She said the night sweats wake her up and she is suffering with sleep loss.    She asked if she could add something over the counter like Estroven to her regimen or what did you recommend?

## 2015-07-02 NOTE — Telephone Encounter (Signed)
Rx sent. Patient advised. 

## 2015-07-02 NOTE — Telephone Encounter (Signed)
We can increase Estrace to 2 mg q daily and take prometrium as prescribed before. Estarce 2 mg # 30 refill x 11

## 2015-08-27 ENCOUNTER — Other Ambulatory Visit: Payer: Self-pay | Admitting: Internal Medicine

## 2015-09-25 ENCOUNTER — Other Ambulatory Visit: Payer: Self-pay | Admitting: Internal Medicine

## 2015-10-19 ENCOUNTER — Other Ambulatory Visit: Payer: Self-pay | Admitting: Internal Medicine

## 2015-11-14 ENCOUNTER — Ambulatory Visit: Payer: Self-pay | Admitting: Internal Medicine

## 2015-11-26 ENCOUNTER — Other Ambulatory Visit: Payer: Self-pay | Admitting: Internal Medicine

## 2015-12-25 ENCOUNTER — Other Ambulatory Visit: Payer: Self-pay | Admitting: Internal Medicine

## 2016-01-29 ENCOUNTER — Encounter: Payer: Self-pay | Admitting: Physician Assistant

## 2016-01-29 ENCOUNTER — Ambulatory Visit (INDEPENDENT_AMBULATORY_CARE_PROVIDER_SITE_OTHER): Payer: BLUE CROSS/BLUE SHIELD | Admitting: Physician Assistant

## 2016-01-29 VITALS — BP 100/70 | HR 95 | Temp 98.1°F | Resp 16 | Ht 64.0 in | Wt 138.0 lb

## 2016-01-29 DIAGNOSIS — J0100 Acute maxillary sinusitis, unspecified: Secondary | ICD-10-CM | POA: Diagnosis not present

## 2016-01-29 MED ORDER — AZITHROMYCIN 250 MG PO TABS: ORAL_TABLET | ORAL | Status: AC

## 2016-01-29 MED ORDER — PROMETHAZINE-CODEINE 6.25-10 MG/5ML PO SYRP: 5.0000 mL | ORAL_SOLUTION | Freq: Four times a day (QID) | ORAL | Status: DC | PRN

## 2016-01-29 MED ORDER — PREDNISONE 20 MG PO TABS: ORAL_TABLET | ORAL | Status: DC

## 2016-01-29 MED ORDER — DEXAMETHASONE SODIUM PHOSPHATE 100 MG/10ML IJ SOLN
10.0000 mg | Freq: Once | INTRAMUSCULAR | Status: AC
  Administered 2016-01-29: 10 mg via INTRAMUSCULAR

## 2016-01-29 NOTE — Patient Instructions (Signed)
Sinusitis can be uncomfortable. People with sinusitis have congestion with yellow/green/gray discharge, sinus pain/pressure, pain around the eyes. Sinus infections almost ALWAYS stem from a viral infection and antibiotics don't work against a virus. Even when bacteria is responsible, the infections usually clear up on their own in a week or so.   PLEASE TRY TO DO OVER THE COUNTER TREATMENT AND PREDNISONE FOR 5-7 DAYS AND IF YOU ARE NOT GETTING BETTER OR GETTING WORSE THEN YOU CAN START ON AN ANTIBIOTIC GIVEN.  Can take the prednisone AT NIGHT WITH DINNER, it take 8-12 hours to start working so it will NOT affect your sleeping if you take it at night with your food!! Take two pills the first night and 1 or two pill the second night and then 1 pill the other nights.   Risk of antibiotic use: About 1 in 4 people who take antibiotics have side effects including stomach problems, dizziness, or rashes. Those problems clear up soon after stopping the drugs, but in rare cases antibiotics can cause severe allergic reaction. Over use of antibiotics also encourages the growth of bacteria that can't be controlled easily with drugs. That makes you more vunerable to antibiotic-resistant infections and undermines the benefits of antibiotics for others.   Waste of Money: Antibiotics often aren't very expensive, but any money spent on unnecessary drugs is money down the drain.   When are antibiotics needed? Only when symptoms last longer than a week.  Start to improve but then worsen again  -It can take up to 2 weeks to feel better.   -If you do not get better in 7-10 days (Have fever, facial pain, dental pain and swelling), then please call the office and it is now appropriate to start an antibiotic.   -Please take Tylenol or Ibuprofen for pain. -Acetaminiphen 325mg orally every 4-6 hours for pain.  Max: 10 per day -Ibuprofen 200mg orally every 6-8 hours for pain.  Take with food to avoid ulcers.   Max 10 per  day  Please pick one of the over the counter allergy medications below and take it once daily for allergies.  Claritin or loratadine cheapest but likely the weakest  Zyrtec or certizine at night because it can make you sleepy The strongest is allegra or fexafinadine  Cheapest at [employer], [employer], [employer]  -While drinking fluids, pinch and hold nose close and swallow.  This will help open up your eustachian tubes to drain the fluid behind your ear drums. -Try steam showers to open your nasal passages.   Drink lots of water to stay hydrated and to thin mucous.  Flonase/Nasonex is to help the inflammation.  Take 2 sprays in each nostril at bedtime.  Make sure you spray towards the outside of each nostril towards the outer corner of your eye, hold nose close and tilt head back.  This will help the medication get into your sinuses.  If you do not like this medication, then use saline nasal sprays same directions as above for Flonase. Stop the medication right away if you get blurring of your vision or nose bleeds.  Sinusitis Sinusitis is redness, soreness, and inflammation of the paranasal sinuses. Paranasal sinuses are air pockets within the bones of your face (beneath the eyes, the middle of the forehead, or above the eyes). In healthy paranasal sinuses, mucus is able to drain out, and air is able to circulate through them by way of your nose. However, when your paranasal sinuses are inflamed, mucus and air can   become trapped. This can allow bacteria and other germs to grow and cause infection. Sinusitis can develop quickly and last only a short time (acute) or continue over a long period (chronic). Sinusitis that lasts for more than 12 weeks is considered chronic.  CAUSES  Causes of sinusitis include: Allergies. Structural abnormalities, such as displacement of the cartilage that separates your nostrils (deviated septum), which can decrease the air flow through your nose and sinuses and affect sinus  drainage. Functional abnormalities, such as when the small hairs (cilia) that line your sinuses and help remove mucus do not work properly or are not present. SIGNS AND SYMPTOMS  Symptoms of acute and chronic sinusitis are the same. The primary symptoms are pain and pressure around the affected sinuses. Other symptoms include: Upper toothache. Earache. Headache. Bad breath. Decreased sense of smell and taste. A cough, which worsens when you are lying flat. Fatigue. Fever. Thick drainage from your nose, which often is green and may contain pus (purulent). Swelling and warmth over the affected sinuses. DIAGNOSIS  Your health care provider will perform a physical exam. During the exam, your health care provider may: Look in your nose for signs of abnormal growths in your nostrils (nasal polyps).  Tap over the affected sinus to check for signs of infection. View the inside of your sinuses (endoscopy) using an imaging device that has a light attached (endoscope). If your health care provider suspects that you have chronic sinusitis, one or more of the following tests may be recommended: Allergy tests. Nasal culture. A sample of mucus is taken from your nose, sent to a lab, and screened for bacteria. Nasal cytology. A sample of mucus is taken from your nose and examined by your health care provider to determine if your sinusitis is related to an allergy. TREATMENT  Most cases of acute sinusitis are related to a viral infection and will resolve on their own within 10 days. Sometimes medicines are prescribed to help relieve symptoms (pain medicine, decongestants, nasal steroid sprays, or saline sprays).  However, for sinusitis related to a bacterial infection, your health care provider will prescribe antibiotic medicines. These are medicines that will help kill the bacteria causing the infection.  Rarely, sinusitis is caused by a fungal infection. In theses cases, your health care provider will  prescribe antifungal medicine. For some cases of chronic sinusitis, surgery is needed. Generally, these are cases in which sinusitis recurs more than 3 times per year, despite other treatments. HOME CARE INSTRUCTIONS  Drink plenty of water. Water helps thin the mucus so your sinuses can drain more easily. Use a humidifier. Inhale steam 3 to 4 times a day (for example, sit in the bathroom with the shower running). Apply a warm, moist washcloth to your face 3 to 4 times a day, or as directed by your health care provider. Use saline nasal sprays to help moisten and clean your sinuses. Take medicines only as directed by your health care provider. If you were prescribed either an antibiotic or antifungal medicine, finish it all even if you start to feel better. SEEK IMMEDIATE MEDICAL CARE IF: You have increasing pain or severe headaches. You have nausea, vomiting, or drowsiness. You have swelling around your face. You have vision problems. You have a stiff neck. You have difficulty breathing. MAKE SURE YOU:  Understand these instructions. Will watch your condition. Will get help right away if you are not doing well or get worse. Document Released: 12/01/2005 Document Revised: 04/17/2014 Document Reviewed: 12/16/2011 [employer]   Patient Information 2015 [employer]. This information is not intended to replace advice given to you by your health care provider. Make sure you discuss any questions you have with your health care provider.   

## 2016-01-29 NOTE — Progress Notes (Signed)
   Subjective:    Patient ID: Beth Miranda, female    DOB: 1961/03/02, 55 y.o.   MRN: [id number]  HPI 55 y.o. WF presents with URI symptoms x 5 days, gotten worse last night. Has had fever, chills, sinus congestion, non productive cough, teeth pain, headache on right side. Has been on advil cold and sinus with sudafed, helped some.   Blood pressure 100/70, pulse 95, temperature 98.1 F (36.7 C), resp. rate 16, height 5\' 4"  (1.626 m), weight 138 lb (62.596 kg), last menstrual period 03/02/2013, SpO2 97 %.  Review of Systems  Constitutional: Negative for chills and diaphoresis.  HENT: Positive for congestion, postnasal drip, sinus pressure and sneezing. Negative for ear pain and sore throat.   Respiratory: Positive for cough. Negative for chest tightness, shortness of breath and wheezing.   Cardiovascular: Negative.   Gastrointestinal: Negative.   Genitourinary: Negative.   Musculoskeletal: Negative for neck pain.  Neurological: Positive for headaches.       Objective:   Physical Exam  Constitutional: She appears well-developed and well-nourished.  HENT:  Head: Normocephalic and atraumatic.  Right Ear: External ear normal.  Nose: Right sinus exhibits maxillary sinus tenderness. Right sinus exhibits no frontal sinus tenderness. Left sinus exhibits maxillary sinus tenderness. Left sinus exhibits no frontal sinus tenderness.  Eyes: Conjunctivae and EOM are normal.  Neck: Normal range of motion. Neck supple.  Cardiovascular: Normal rate, regular rhythm, normal heart sounds and intact distal pulses.   Pulmonary/Chest: Effort normal and breath sounds normal. No respiratory distress. She has no wheezes.  Abdominal: Soft. Bowel sounds are normal.  Lymphadenopathy:    She has cervical adenopathy.  Skin: Skin is warm and dry.       Assessment & Plan:  1. Acute maxillary sinusitis, recurrence not specified - azithromycin (ZITHROMAX) 250 MG tablet; Take 2 tablets (500 mg) on  Day 1,   followed by 1 tablet (250 mg) once daily on Days 2 through 5.  Dispense: 6 each; Refill: 1 - predniSONE (DELTASONE) 20 MG tablet; 2 tablets daily for 3 days, 1 tablet daily for 4 days.  Dispense: 10 tablet; Refill: 0 - promethazine-codeine (PHENERGAN WITH CODEINE) 6.25-10 MG/5ML syrup; Take 5 mLs by mouth every 6 (six) hours as needed for cough. Max: 70mL per day  Dispense: 240 mL; Refill: 0 - dexamethasone (DECADRON) injection 10 mg; Inject 1 mL (10 mg total) into the muscle once.

## 2016-02-05 ENCOUNTER — Other Ambulatory Visit: Payer: Self-pay | Admitting: Internal Medicine

## 2016-04-08 ENCOUNTER — Other Ambulatory Visit: Payer: Self-pay | Admitting: Gynecology

## 2016-04-18 ENCOUNTER — Other Ambulatory Visit: Payer: Self-pay

## 2016-04-18 DIAGNOSIS — Z1231 Encounter for screening mammogram for malignant neoplasm of breast: Secondary | ICD-10-CM

## 2016-04-24 ENCOUNTER — Other Ambulatory Visit: Payer: Self-pay | Admitting: Gynecology

## 2016-05-02 ENCOUNTER — Encounter: Payer: Self-pay | Admitting: Gynecology

## 2016-05-02 ENCOUNTER — Ambulatory Visit (INDEPENDENT_AMBULATORY_CARE_PROVIDER_SITE_OTHER): Payer: BLUE CROSS/BLUE SHIELD | Admitting: Gynecology

## 2016-05-02 VITALS — BP 120/70 | Ht 64.0 in | Wt 138.0 lb

## 2016-05-02 DIAGNOSIS — Z01419 Encounter for gynecological examination (general) (routine) without abnormal findings: Secondary | ICD-10-CM

## 2016-05-02 DIAGNOSIS — Z79890 Hormone replacement therapy (postmenopausal): Secondary | ICD-10-CM | POA: Diagnosis not present

## 2016-05-02 DIAGNOSIS — M8580 Other specified disorders of bone density and structure, unspecified site: Secondary | ICD-10-CM

## 2016-05-02 MED ORDER — ESTRADIOL 2 MG PO TABS: 2.0000 mg | ORAL_TABLET | Freq: Every day | ORAL | Status: DC

## 2016-05-02 MED ORDER — PROGESTERONE MICRONIZED 200 MG PO CAPS: ORAL_CAPSULE | ORAL | Status: DC

## 2016-05-02 NOTE — Progress Notes (Signed)
Beth Miranda 1961/11/19 [id number]   History:    55 y.o.  for annual gyn exam with no complaints today. Patient doing well on Estrace 2 mg by mouth daily with the addition of Prometrium 200 mg for 12 days a month. She reports no vaginal bleeding and no vasomotor symptoms. Review of patient's bone density study in 2016 had indicated that her lowest T score was at the right femoral neck with a value of -1.4 and normal Frax analysis.Patient has history of hereditary an idiopathic peripheral neuropathy and is borderline diabetic. Patient with no past history of any abnormal Pap smear. Her colonoscopy is up-to-date and she is on a 10 year recall.  Past medical history,surgical history, family history and social history were all reviewed and documented in the EPIC chart.  Gynecologic History Patient's last menstrual period was 03/02/2013. Contraception: post menopausal status Last Pap: 2016. Results were: normal Last mammogram: 2016. Results were: Normal but dense patient will need 3-dimensional mammograms  Obstetric History OB History  Gravida Para Term Preterm AB SAB TAB Ectopic Multiple Living  0 0 0 0 0 0 0 0 0 0          ROS: A ROS was performed and pertinent positives and negatives are included in the history.  GENERAL: No fevers or chills. HEENT: No change in vision, no earache, sore throat or sinus congestion. NECK: No pain or stiffness. CARDIOVASCULAR: No chest pain or pressure. No palpitations. PULMONARY: No shortness of breath, cough or wheeze. GASTROINTESTINAL: No abdominal pain, nausea, vomiting or diarrhea, melena or bright red blood per rectum. GENITOURINARY: No urinary frequency, urgency, hesitancy or dysuria. MUSCULOSKELETAL: No joint or muscle pain, no back pain, no recent trauma. DERMATOLOGIC: No rash, no itching, no lesions. ENDOCRINE: No polyuria, polydipsia, no heat or cold intolerance. No recent change in weight. HEMATOLOGICAL: No anemia or easy bruising or bleeding.  NEUROLOGIC: No headache, seizures, numbness, tingling or weakness. PSYCHIATRIC: No depression, no loss of interest in normal activity or change in sleep pattern.     Exam: chaperone present  BP 120/70 mmHg  Ht 5\' 4"  (1.626 m)  Wt 138 lb (62.596 kg)  BMI 23.68 kg/m2  LMP 03/02/2013  Body mass index is 23.68 kg/(m^2).  General appearance : Well developed well nourished female. No acute distress HEENT: Eyes: no retinal hemorrhage or exudates,  Neck supple, trachea midline, no carotid bruits, no thyroidmegaly Lungs: Clear to auscultation, no rhonchi or wheezes, or rib retractions  Heart: Regular rate and rhythm, no murmurs or gallops Breast:Examined in sitting and supine position were symmetrical in appearance, no palpable masses or tenderness,  no skin retraction, no nipple inversion, no nipple discharge, no skin discoloration, no axillary or supraclavicular lymphadenopathy Abdomen: no palpable masses or tenderness, no rebound or guarding Extremities: no edema or skin discoloration or tenderness  Pelvic:  Bartholin, Urethra, Skene Glands: Within normal limits             Vagina: No gross lesions or discharge  Cervix: No gross lesions or discharge  Uterus  anteverted, normal size, shape and consistency, non-tender and mobile  Adnexa  Without masses or tenderness  Anus and perineum  normal   Rectovaginal  normal sphincter tone without palpated masses or tenderness             Hemoccult PCP provides     Assessment/Plan:  55 y.o. female for annual exam with osteopenia taking her calcium and vitamin D. PCP is been doing her blood work. Patient  Has scheduled her mammogram. Pap smear not indicated this year according to the new guidelines. We discussed importance of calcium vitamin D and weightbearing exercises for osteoporosis prevention. Prescription refill for her Estrace and Prometrium was provided   Terrance Mass MD, 3:59 PM 05/02/2016

## 2016-05-16 ENCOUNTER — Telehealth: Payer: Self-pay | Admitting: *Deleted

## 2016-05-16 NOTE — Telephone Encounter (Signed)
Pt called requesting labs results from [facility] 05/02/16. I called pt back and told her no labs were drawn states PCP does them and no pap this year due to the new guidelines. Pt said okay

## 2016-05-23 ENCOUNTER — Ambulatory Visit: Payer: No Typology Code available for payment source

## 2016-06-02 ENCOUNTER — Ambulatory Visit
Admission: RE | Admit: 2016-06-02 | Discharge: 2016-06-02 | Disposition: A | Discharge status: Home / Self Care | Payer: BLUE CROSS/BLUE SHIELD | Source: Ambulatory Visit

## 2016-06-02 DIAGNOSIS — Z1231 Encounter for screening mammogram for malignant neoplasm of breast: Secondary | ICD-10-CM

## 2016-06-04 ENCOUNTER — Encounter: Payer: Self-pay | Admitting: Internal Medicine

## 2016-06-04 ENCOUNTER — Ambulatory Visit (INDEPENDENT_AMBULATORY_CARE_PROVIDER_SITE_OTHER): Payer: BLUE CROSS/BLUE SHIELD | Admitting: Internal Medicine

## 2016-06-04 VITALS — BP 120/82 | HR 88 | Temp 97.3°F | Resp 16 | Ht 64.0 in | Wt 136.4 lb

## 2016-06-04 DIAGNOSIS — Z136 Encounter for screening for cardiovascular disorders: Secondary | ICD-10-CM

## 2016-06-04 DIAGNOSIS — Z111 Encounter for screening for respiratory tuberculosis: Secondary | ICD-10-CM | POA: Diagnosis not present

## 2016-06-04 DIAGNOSIS — M351 Other overlap syndromes: Secondary | ICD-10-CM | POA: Diagnosis not present

## 2016-06-04 DIAGNOSIS — E785 Hyperlipidemia, unspecified: Secondary | ICD-10-CM

## 2016-06-04 DIAGNOSIS — Z0000 Encounter for general adult medical examination without abnormal findings: Secondary | ICD-10-CM | POA: Diagnosis not present

## 2016-06-04 DIAGNOSIS — Z0001 Encounter for general adult medical examination with abnormal findings: Secondary | ICD-10-CM

## 2016-06-04 DIAGNOSIS — M79676 Pain in unspecified toe(s): Secondary | ICD-10-CM

## 2016-06-04 DIAGNOSIS — E559 Vitamin D deficiency, unspecified: Secondary | ICD-10-CM

## 2016-06-04 DIAGNOSIS — Z79899 Other long term (current) drug therapy: Secondary | ICD-10-CM | POA: Diagnosis not present

## 2016-06-04 DIAGNOSIS — K219 Gastro-esophageal reflux disease without esophagitis: Secondary | ICD-10-CM

## 2016-06-04 DIAGNOSIS — R5383 Other fatigue: Secondary | ICD-10-CM | POA: Diagnosis not present

## 2016-06-04 DIAGNOSIS — Z1212 Encounter for screening for malignant neoplasm of rectum: Secondary | ICD-10-CM

## 2016-06-04 DIAGNOSIS — R7303 Prediabetes: Secondary | ICD-10-CM | POA: Diagnosis not present

## 2016-06-04 DIAGNOSIS — B351 Tinea unguium: Secondary | ICD-10-CM

## 2016-06-04 DIAGNOSIS — R6889 Other general symptoms and signs: Secondary | ICD-10-CM

## 2016-06-04 DIAGNOSIS — I10 Essential (primary) hypertension: Secondary | ICD-10-CM

## 2016-06-04 DIAGNOSIS — M79675 Pain in left toe(s): Secondary | ICD-10-CM

## 2016-06-04 DIAGNOSIS — E039 Hypothyroidism, unspecified: Secondary | ICD-10-CM | POA: Diagnosis not present

## 2016-06-04 DIAGNOSIS — R0989 Other specified symptoms and signs involving the circulatory and respiratory systems: Secondary | ICD-10-CM

## 2016-06-04 LAB — CBC WITH DIFFERENTIAL/PLATELET
Basophils Absolute: 42 cells/uL (ref 0–200)
Basophils Relative: 1 %
Eosinophils Absolute: 42 cells/uL (ref 15–500)
Eosinophils Relative: 1 %
HCT: 43.7 % (ref 35.0–45.0)
Hemoglobin: 14.1 g/dL (ref 11.7–15.5)
Lymphocytes Relative: 37 %
Lymphs Abs: 1554 cells/uL (ref 850–3900)
MCH: 28.8 pg (ref 27.0–33.0)
MCHC: 32.3 g/dL (ref 32.0–36.0)
MCV: 89.4 fL (ref 80.0–100.0)
MPV: 9.9 fL (ref 7.5–12.5)
Monocytes Absolute: 462 cells/uL (ref 200–950)
Monocytes Relative: 11 %
Neutro Abs: 2100 cells/uL (ref 1500–7800)
Neutrophils Relative %: 50 %
Platelets: 223 10*3/uL (ref 140–400)
RBC: 4.89 MIL/uL (ref 3.80–5.10)
RDW: 13.3 % (ref 11.0–15.0)
WBC: 4.2 10*3/uL (ref 3.8–10.8)

## 2016-06-04 LAB — VITAMIN B12: Vitamin B-12: 927 pg/mL (ref 200–1100)

## 2016-06-04 LAB — TSH: TSH: 2.18 mIU/L

## 2016-06-04 NOTE — Progress Notes (Signed)
Patient ID: Beth Miranda, female   DOB: 1961/07/17, 55 y.o.   MRN: [id number]  [facility] ADULT & ADOLESCENT INTERNAL MEDICINE                   Unk Pinto, M.D.    Uvaldo Bristle. Silverio Lay, P.A.-C      Starlyn Skeans, P.A.-C   [facility]                [address] [place], [place] [id number] Telephone [phone] Telefax [phone]  ______________________________________________________________________  Annual Screening/Preventative Visit And Comprehensive Evaluation &  Examination     This very nice 55 y.o. MWF presents for a Wellness/Preventative Visit & comprehensive evaluation and management of multiple medical co-morbidities.  Patient has been followed for HTN, Prediabetes, Hyperlipidemia and Vitamin D Deficiency. Patient has GERD controlled on diet & current meds. Patient is also on Thyroid replacement. 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 Patient has GERD controlled with died and current meds.       Patient has labile HTN predating and followed expectantly since 2000. Patient's BP has been controlled at home and patient denies any cardiac symptoms as chest pain, palpitations, shortness of breath, dizziness or ankle swelling. Today's BP is 120/82 mmHg      Patient's hyperlipidemia is not controlled with diet. Last lipids were not at goal with T Chol 179, HDL 56 , Trig 75 and elevated LDL 108 in May 2016.     Patient has prediabetes predating since 2011 with A1c 6.0% and in 2012 with A1c 5.9% and patient denies reactive hypoglycemic symptoms, visual blurring, diabetic polys, or paresthesias. Patient is on Cinnamon supplements for increasing insulin sensitivity. Last A1c was 5.9% in May 2016.     Patient has been maintained on thyroid replacement since Apr 2010. Finally, patient has history of Vitamin D Deficiency and last Vitamin D  was 33 in May 2016.   Medication Sig  . aspirin 81 MG  Take 81 mg by mouth daily.  . Cetirizine  Take 10 mg by mouth at bedtime.  Marland Kitchen VITAMIN D 2000 UNITS  Take 2,000 Units by mouth 2 (two) times daily.  . Cinnamon 500 MG  Take 2,000 mg by mouth daily.  Marland Kitchen VITAMIN B 12  Take 1,000 mcg by mouth daily.  Marland Kitchen estradiol  2 MG  Take 1 tablet (2 mg total) by mouth daily.  Marland Kitchen levothyroxine  50 MCG  TAKE ONE TABLET BY MOUTH ONCE DAILY.  . Magnesium 200 MG Take 200 mg by mouth 2 (two) times daily.  . Omega-3 FISH OIL 1200 MG  Take 2 capsules by mouth daily.  . progesterone  200 MG  TAKE 1 CAPSULE BY MOUTH ON THE FIRST 12 DAYS OF THE MONTH.  Marland Kitchen Ranitidine 300 MG tablet Take 300 mg by mouth at bedtime.  . Red Yeast Rice Extract  Take 1,200 mg by mouth daily.   Allergies  Allergen Reactions  . Lamisil [Terbinafine Hcl] Hives  . Penicillins Hives  . Sulfa Antibiotics Hives   Past Medical History  Diagnosis Date  . Raynaud's disease   . Labile hypertension   . GERD (gastroesophageal reflux disease)   . Thyroid disease     hypothyroid  . Allergy     seasonal  . Hyperlipidemia   .  Prediabetes   . Vitamin D deficiency    Health Maintenance  Topic Date Due  . INFLUENZA VACCINE  07/15/2016  . TETANUS/TDAP  01/06/2017  . MAMMOGRAM  05/21/2017  . PAP SMEAR  05/01/2018  . COLONOSCOPY  05/28/2022  . Hepatitis C Screening  Completed  . HIV Screening  Completed   Immunization History  Administered Date(s) Administered  . PPD Test 05/09/2014, 05/10/2015  . Pneumococcal-Unspecified 12/15/1992  . Td 01/06/2007   Past Surgical History  Procedure Laterality Date  . Tonsillectomy     Family History  Problem Relation Age of Onset  . Pancreatic cancer Paternal Grandmother   . Arthritis Mother     Living  . COPD Mother   . Asthma Mother   . Hypertension Father     Living  . Diabetes Father   . Kidney disease Father     Stones  . Hyperlipidemia Father   . Other Brother     Deceased    Social History  Substance Use Topics  . Smoking status: Never Smoker   . Smokeless tobacco: Never Used  . Alcohol Use: 0.6 oz/week    1 Standard drinks or equivalent per week     Comment: 2 glasses liquor per week    ROS Constitutional: Denies fever, chills, weight loss/gain, headaches, insomnia,  night sweats, and change in appetite. Does c/o fatigue. Eyes: Denies redness, blurred vision, diplopia, discharge, itchy, watery eyes.  ENT: Denies discharge, congestion, post nasal drip, epistaxis, sore throat, earache, hearing loss, dental pain, Tinnitus, Vertigo, Sinus pain, snoring.  Cardio: Denies chest pain, palpitations, irregular heartbeat, syncope, dyspnea, diaphoresis, orthopnea, PND, claudication, edema Respiratory: denies cough, dyspnea, DOE, pleurisy, hoarseness, laryngitis, wheezing.  Gastrointestinal: Denies dysphagia, heartburn, reflux, water brash, pain, cramps, nausea, vomiting, bloating, diarrhea, constipation, hematemesis, melena, hematochezia, jaundice, hemorrhoids Genitourinary: Denies dysuria, frequency, urgency, nocturia, hesitancy, discharge, hematuria, flank pain Breast: Breast lumps, nipple discharge, bleeding.  Musculoskeletal: Denies arthralgia, myalgia, stiffness, Jt. Swelling, pain, limp, and strain/sprain. Denies falls. Skin: Denies puritis, rash, hives, warts, acne, eczema, changing in skin lesion Neuro: No weakness, tremor, incoordination, spasms, paresthesia, pain Psychiatric: Denies confusion, memory loss, sensory loss. Denies Depression. Endocrine: Denies change in weight, skin, hair change, nocturia, and paresthesia, diabetic polys, visual blurring, hyper / hypo glycemic episodes.  Heme/Lymph: No excessive bleeding, bruising, enlarged lymph nodes.  Physical Exam  BP 120/82 mmHg  Pulse 88  Temp(Src) 97.3 F (36.3 C)  Resp 16  Ht 5\' 4"  (1.626 m)  Wt 136 lb 6.4 oz (61.871 kg)  BMI 23.40 kg/m2  LMP 03/02/2013  General Appearance: Well nourished and  in no apparent distress. Eyes: PERRLA, EOMs, conjunctiva no swelling or erythema, normal fundi and vessels. Sinuses: No frontal/maxillary tenderness ENT/Mouth: EACs patent / TMs  nl. Nares clear without erythema, swelling, mucoid exudates. Oral hygiene is good. No erythema, swelling, or exudate. Tongue normal, non-obstructing. Tonsils not swollen or erythematous. Hearing normal.  Neck: Supple, thyroid normal. No bruits, nodes or JVD. Respiratory: Respiratory effort normal.  BS equal and clear bilateral without rales, rhonci, wheezing or stridor. Cardio: Heart sounds are normal with regular rate and rhythm and no murmurs, rubs or gallops. Peripheral pulses are normal and equal bilaterally without edema. No aortic or femoral bruits. Chest: symmetric with normal excursions and percussion. Breasts: Symmetric, without lumps, nipple discharge, retractions, or fibrocystic changes.  Abdomen: Flat, soft with bowel sounds active. Nontender, no guarding, rebound, hernias, masses, or organomegaly.  Lymphatics: Non tender without lymphadenopathy.  Musculoskeletal: Full ROM all  peripheral extremities, joint stability, 5/5 strength, and normal gait. Skin: Warm and dry without rashes, lesions, cyanosis, clubbing or  ecchymosis.  Neuro: Cranial nerves intact, reflexes equal bilaterally. Normal muscle tone, no cerebellar symptoms. Sensation intact.  Pysch: Alert and oriented X 3, normal affect, Insight and Judgment appropriate.   Assessment and Plan  1. Annual Preventative Screening Examination  - Microalbumin / creatinine urine ratio - EKG 12-Lead - [place], RETROPERITNL ABD,  LTD - POC Hemoccult Bld/Stl (3-Cd Home Screen); Future - Urinalysis, Routine w reflex microscopic (not at [facility]) - Vitamin B12 - Iron and TIBC - CBC with Differential/Platelet - BASIC METABOLIC PANEL WITH GFR - Hepatic function panel - Magnesium - Lipid panel - TSH - Hemoglobin A1c - Insulin, random - VITAMIN D 25 Hydroxy   2.  Labile hypertension  - Microalbumin / creatinine urine ratio - EKG 12-Lead - [place], RETROPERITNL ABD,  LTD - TSH  3. Hyperlipidemia  - Lipid panel - TSH  4. Prediabetes  - Hemoglobin A1c - Insulin, random  5. Vitamin D deficiency  - VITAMIN D 25 Hydroxy (Vit-D Deficiency, Fractures)  6. Hypothyroidism, unspecified hypothyroidism type  - TSH  7. Gastroesophageal reflux disease, esophagitis presence not specified   8. Screening for rectal cancer  - POC Hemoccult Bld/Stl (3-Cd Home Screen); Future  9. Screening for AAA (aortic abdominal aneurysm)   10. MCTD (mixed connective tissue disease), remote  ([place])   11. Other fatigue  - Vitamin B12 - Iron and TIBC - CBC with Differential/Platelet - TSH  12. Medication management  - Urinalysis, Routine w reflex microscopic (not at [facility]) - CBC with Differential/Platelet - BASIC METABOLIC PANEL WITH GFR - Hepatic function panel - Magnesium  13. Pain due to onychomycosis of toenail of left foot  - Culture, Fungus  14. Screening examination for pulmonary tuberculosis  - PPD   Continue prudent diet as discussed, weight control, BP monitoring, regular exercise, and medications. Discussed med's effects and SE's. Screening labs and tests as requested with regular follow-up as recommended. Over 40 minutes of exam, counseling, chart review and high complex critical decision making was performed.

## 2016-06-04 NOTE — Patient Instructions (Signed)
Recommend Adult Low Dose Aspirin or   coated  Aspirin 81 mg daily   To reduce risk of Colon Cancer 20 %,   Skin Cancer 26 % ,   Melanoma 46%   and   Pancreatic cancer 60%   ++++++++++++++++++++++++++++++++++++++++++++++++++++++ Vitamin D goal   is between 70-100.   Please make sure that you are taking your Vitamin D as directed.   It is very important as a natural anti-inflammatory   helping hair, skin, and nails, as well as reducing stroke and heart attack risk.   It helps your bones and helps with mood.  It also decreases numerous cancer risks so please take it as directed.   Low Vit D is associated with a 200-300% higher risk for CANCER   and 200-300% higher risk for HEART   ATTACK  &  STROKE.   .....................................Marland Kitchen  It is also associated with higher death rate at younger ages,   autoimmune diseases like Rheumatoid arthritis, Lupus, Multiple Sclerosis.     Also many other serious conditions, like depression, Alzheimer's  Dementia, infertility, muscle aches, fatigue, fibromyalgia - just to name a few.  ++++++++++++++++++++++++++++++++++++++++++++++++  Recommend the book "The END of DIETING" by Dr Excell Seltzer   & the book "The END of DIABETES " by Dr Excell Seltzer  At [email or web address] - get book & Audio CD's     Being diabetic has a  300% increased risk for heart attack, stroke, cancer, and alzheimer- type vascular dementia. It is very important that you work harder with diet by avoiding all foods that are white. Avoid white rice (brown & wild rice is OK), white potatoes (sweetpotatoes in moderation is OK), White bread or wheat bread or anything made out of white flour like bagels, donuts, rolls, buns, biscuits, cakes, pastries, cookies, pizza crust, and pasta (made from white flour & egg whites) - vegetarian pasta or spinach or wheat pasta is OK. Multigrain breads like Arnold's or Pepperidge [employer], or multigrain sandwich thins or flatbreads.  Diet,  exercise and weight loss can reverse and cure diabetes in the early stages.  Diet, exercise and weight loss is very important in the control and prevention of complications of diabetes which affects every system in your body, ie. Brain - dementia/stroke, eyes - glaucoma/blindness, heart - heart attack/heart failure, kidneys - dialysis, stomach - gastric paralysis, intestines - malabsorption, nerves - severe painful neuritis, circulation - gangrene & loss of a leg(s), and finally cancer and Alzheimers.    I recommend avoid fried & greasy foods,  sweets/candy, white rice (brown or wild rice or Quinoa is OK), white potatoes (sweet potatoes are OK) - anything made from white flour - bagels, doughnuts, rolls, buns, biscuits,white and wheat breads, pizza crust and traditional pasta made of white flour & egg white(vegetarian pasta or spinach or wheat pasta is OK).  Multi-grain bread is OK - like multi-grain flat bread or sandwich thins. Avoid alcohol in excess. Exercise is also important.    Eat all the vegetables you want - avoid meat, especially red meat and dairy - especially cheese.  Cheese is the most concentrated form of trans-fats which is the worst thing to clog up our arteries. Veggie cheese is OK which can be found in the fresh produce section at [employer] or [employer] or [employer]  ++++++++++++++++++++++++++++++++++++++++++++++++++ DASH Eating Plan  DASH stands for "Dietary Approaches to Stop Hypertension."   The DASH eating plan is a healthy eating plan that has been shown to reduce high blood  pressure (hypertension). Additional health benefits may include reducing the risk of type 2 diabetes mellitus, heart disease, and stroke. The DASH eating plan may also help with weight loss.  WHAT DO I NEED TO KNOW ABOUT THE DASH EATING PLAN?  For the DASH eating plan, you will follow these general guidelines:  Choose foods with a percent daily value for sodium of less than 5% (as listed on the food  label).  Use salt-free seasonings or herbs instead of table salt or sea salt.  Check with your health care provider or pharmacist before using salt substitutes.  Eat lower-sodium products, often labeled as "lower sodium" or "no salt added."  Eat fresh foods.  Eat more vegetables, fruits, and low-fat dairy products.    Choose whole grains. Look for the word "whole" as the first word in the ingredient list.  Choose fish   Limit sweets, desserts, sugars, and sugary drinks.  Choose heart-healthy fats.  Eat veggie cheese   Eat more home-cooked food and less restaurant, buffet, and fast food.  Limit fried foods.  [place]uffaker foods using methods other than frying.  Limit canned vegetables. If you do use them, rinse them well to decrease the sodium.  When eating at a restaurant, ask that your food be prepared with less salt, or no salt if possible.                      WHAT FOODS CAN I EAT?  Read Dr Fara Olden Fuhrman's books on The End of Dieting & The End of Diabetes  Grains  Whole grain or whole wheat bread. Brown rice. Whole grain or whole wheat pasta. Quinoa, bulgur, and whole grain cereals. Low-sodium cereals. Corn or whole wheat flour tortillas. Whole grain cornbread. Whole grain crackers. Low-sodium crackers.  Vegetables  Fresh or frozen vegetables (raw, steamed, roasted, or grilled). Low-sodium or reduced-sodium tomato and vegetable juices. Low-sodium or reduced-sodium tomato sauce and paste. Low-sodium or reduced-sodium canned vegetables.   Fruits  All fresh, canned (in natural juice), or frozen fruits.  Protein Products   All fish and seafood.  Dried beans, peas, or lentils. Unsalted nuts and seeds. Unsalted canned beans.  Dairy  Low-fat dairy products, such as skim or 1% milk, 2% or reduced-fat cheeses, low-fat ricotta or cottage cheese, or plain low-fat yogurt. Low-sodium or reduced-sodium cheeses.  Fats and Oils  Tub margarines without trans fats. Light or  reduced-fat mayonnaise and salad dressings (reduced sodium). Avocado. Safflower, olive, or canola oils. Natural peanut or almond butter.  Other  Unsalted popcorn and pretzels. The items listed above may not be a complete list of recommended foods or beverages. Contact your dietitian for more options.  +++++++++++++++++++++++++++++++++++++++++++  WHAT FOODS ARE NOT RECOMMENDED?  Grains/ White flour or wheat flour  White bread. White pasta. White rice. Refined cornbread. Bagels and croissants. Crackers that contain trans fat.  Vegetables  Creamed or fried vegetables. Vegetables in a . Regular canned vegetables. Regular canned tomato sauce and paste. Regular tomato and vegetable juices.  Fruits  Dried fruits. Canned fruit in light or heavy syrup. Fruit juice.  Meat and Other Protein Products  Meat in general - RED mwaet & White meat.  Fatty cuts of meat. Ribs, chicken wings, bacon, sausage, bologna, salami, chitterlings, fatback, hot dogs, bratwurst, and packaged luncheon meats.  Dairy  Whole or 2% milk, cream, half-and-half, and cream cheese. Whole-fat or sweetened yogurt. Full-fat cheeses or blue cheese. Nondairy creamers and whipped toppings. Processed cheese, cheese spreads, or  cheese curds.  Condiments  Onion and garlic salt, seasoned salt, table salt, and sea salt. Canned and packaged gravies. Worcestershire sauce. Tartar sauce. Barbecue sauce. Teriyaki sauce. Soy sauce, including reduced sodium. Steak sauce. Fish sauce. Oyster sauce. Cocktail sauce. Horseradish. Ketchup and mustard. Meat flavorings and tenderizers. Bouillon cubes. Hot sauce. Tabasco sauce. Marinades. Taco seasonings. Relishes.  Fats and Oils Butter, stick margarine, lard, shortening and bacon fat. Coconut, palm kernel, or palm oils. Regular salad dressings.  Pickles and olives. Salted popcorn and pretzels.  The items listed above may not be a complete list of foods and beverages to avoid.   Preventive  Care for Adults  A healthy lifestyle and preventive care can promote health and wellness. Preventive health guidelines for women include the following key practices.  A routine yearly physical is a good way to check with your health care provider about your health and preventive screening. It is a chance to share any concerns and updates on your health and to receive a thorough exam.  Visit your dentist for a routine exam and preventive care every 6 months. Brush your teeth twice a day and floss once a day. Good oral hygiene prevents tooth decay and gum disease.  The frequency of eye exams is based on your age, health, family medical history, use of contact lenses, and other factors. Follow your health care provider's recommendations for frequency of eye exams.  Eat a healthy diet. Foods like vegetables, fruits, whole grains, low-fat dairy products, and lean protein foods contain the nutrients you need without too many calories. Decrease your intake of foods high in solid fats, added sugars, and salt. Eat the right amount of calories for you.Get information about a proper diet from your health care provider, if necessary.  Regular physical exercise is one of the most important things you can do for your health. Most adults should get at least 150 minutes of moderate-intensity exercise (any activity that increases your heart rate and causes you to sweat) each week. In addition, most adults need muscle-strengthening exercises on 2 or more days a week.  Maintain a healthy weight. The body mass index (BMI) is a screening tool to identify possible weight problems. It provides an estimate of body fat based on height and weight. Your health care provider can find your BMI and can help you achieve or maintain a healthy weight.For adults 20 years and older:  A BMI below 18.5 is considered underweight.  A BMI of 18.5 to 24.9 is normal.  A BMI of 25 to 29.9 is considered overweight.  A BMI of 30 and  above is considered obese.  Maintain normal blood lipids and cholesterol levels by exercising and minimizing your intake of saturated fat. Eat a balanced diet with plenty of fruit and vegetables. Blood tests for lipids and cholesterol should begin at age 21 and be repeated every 5 years. If your lipid or cholesterol levels are high, you are over 50, or you are at high risk for heart disease, you may need your cholesterol levels checked more frequently.Ongoing high lipid and cholesterol levels should be treated with medicines if diet and exercise are not working.  If you smoke, find out from your health care provider how to quit. If you do not use tobacco, do not start.  Lung cancer screening is recommended for adults aged 31-80 years who are at high risk for developing lung cancer because of a history of smoking. A yearly low-dose CT scan of the lungs is recommended  for people who have at least a 30-pack-year history of smoking and are a current smoker or have quit within the past 15 years. A pack year of smoking is smoking an average of 1 pack of cigarettes a day for 1 year (for example: 1 pack a day for 30 years or 2 packs a day for 15 years). Yearly screening should continue until the smoker has stopped smoking for at least 15 years. Yearly screening should be stopped for people who develop a health problem that would prevent them from having lung cancer treatment.  High blood pressure causes heart disease and increases the risk of stroke. Your blood pressure should be checked at least every 1 to 2 years. Ongoing high blood pressure should be treated with medicines if weight loss and exercise do not work.  If you are 30-80 years old, ask your health care provider if you should take aspirin to prevent strokes.  Diabetes screening involves taking a blood sample to check your fasting blood sugar level. This should be done once every 3 years, after age 61, if you are within normal weight and without risk  factors for diabetes. Testing should be considered at a younger age or be carried out more frequently if you are overweight and have at least 1 risk factor for diabetes.  Breast cancer screening is essential preventive care for women. You should practice "breast self-awareness." This means understanding the normal appearance and feel of your breasts and may include breast self-examination. Any changes detected, no matter how small, should be reported to a health care provider. Women in their 77s and 30s should have a clinical breast exam (CBE) by a health care provider as part of a regular health exam every 1 to 3 years. After age 81, women should have a CBE every year. Starting at age 20, women should consider having a mammogram (breast X-ray test) every year. Women who have a family history of breast cancer should talk to their health care provider about genetic screening. Women at a high risk of breast cancer should talk to their health care providers about having an MRI and a mammogram every year.  Breast cancer gene (BRCA)-related cancer risk assessment is recommended for women who have family members with BRCA-related cancers. BRCA-related cancers include breast, ovarian, tubal, and peritoneal cancers. Having family members with these cancers may be associated with an increased risk for harmful changes (mutations) in the breast cancer genes BRCA1 and BRCA2. Results of the assessment will determine the need for genetic counseling and BRCA1 and BRCA2 testing.  Routine pelvic exams to screen for cancer are no longer recommended for nonpregnant women who are considered low risk for cancer of the pelvic organs (ovaries, uterus, and vagina) and who do not have symptoms. Ask your health care provider if a screening pelvic exam is right for you.  If you have had past treatment for cervical cancer or a condition that could lead to cancer, you need Pap tests and screening for cancer for at least 20 years after  your treatment. If Pap tests have been discontinued, your risk factors (such as having a new sexual partner) need to be reassessed to determine if screening should be resumed. Some women have medical problems that increase the chance of getting cervical cancer. In these cases, your health care provider may recommend more frequent screening and Pap tests.  Colorectal cancer can be detected and often prevented. Most routine colorectal cancer screening begins at the age of 22 years and  continues through age 74 years. However, your health care provider may recommend screening at an earlier age if you have risk factors for colon cancer. On a yearly basis, your health care provider may provide home test kits to check for hidden blood in the stool. Use of a small camera at the end of a tube, to directly examine the colon (sigmoidoscopy or colonoscopy), can detect the earliest forms of colorectal cancer. Talk to your health care provider about this at age 22, when routine screening begins. Direct exam of the colon should be repeated every 5-10 years through age 76 years, unless early forms of pre-cancerous polyps or small growths are found.  Hepatitis C blood testing is recommended for all people born from [birth year] through 1964 and any individual with known risks for hepatitis C.  Pra  Osteoporosis is a disease in which the bones lose minerals and strength with aging. This can result in serious bone fractures or breaks. The risk of osteoporosis can be identified using a bone density scan. Women ages 88 years and over and women at risk for fractures or osteoporosis should discuss screening with their health care providers. Ask your health care provider whether you should take a calcium supplement or vitamin D to reduce the rate of osteoporosis.  Menopause can be associated with physical symptoms and risks. Hormone replacement therapy is available to decrease symptoms and risks. You should talk to your health care  provider about whether hormone replacement therapy is right for you.  Use sunscreen. Apply sunscreen liberally and repeatedly throughout the day. You should seek shade when your shadow is shorter than you. Protect yourself by wearing long sleeves, pants, a wide-brimmed hat, and sunglasses year round, whenever you are outdoors.  Once a month, do a whole body skin exam, using a mirror to look at the skin on your back. Tell your health care provider of new moles, moles that have irregular borders, moles that are larger than a pencil eraser, or moles that have changed in shape or color.  Stay current with required vaccines (immunizations).  Influenza vaccine. All adults should be immunized every year.  Tetanus, diphtheria, and acellular pertussis (Td, Tdap) vaccine. Pregnant women should receive 1 dose of Tdap vaccine during each pregnancy. The dose should be obtained regardless of the length of time since the last dose. Immunization is preferred during the 27th-36th week of gestation. An adult who has not previously received Tdap or who does not know her vaccine status should receive 1 dose of Tdap. This initial dose should be followed by tetanus and diphtheria toxoids (Td) booster doses every 10 years. Adults with an unknown or incomplete history of completing a 3-dose immunization series with Td-containing vaccines should begin or complete a primary immunization series including a Tdap dose. Adults should receive a Td booster every 10 years.  Varicella vaccine. An adult without evidence of immunity to varicella should receive 2 doses or a second dose if she has previously received 1 dose. Pregnant females who do not have evidence of immunity should receive the first dose after pregnancy. This first dose should be obtained before leaving the health care facility. The second dose should be obtained 4-8 weeks after the first dose.  Human papillomavirus (HPV) vaccine. Females aged 13-26 years who have not  received the vaccine previously should obtain the 3-dose series. The vaccine is not recommended for use in pregnant females. However, pregnancy testing is not needed before receiving a dose. If a female is found to  be pregnant after receiving a dose, no treatment is needed. In that case, the remaining doses should be delayed until after the pregnancy. Immunization is recommended for any person with an immunocompromised condition through the age of 14 years if she did not get any or all doses earlier. During the 3-dose series, the second dose should be obtained 4-8 weeks after the first dose. The third dose should be obtained 24 weeks after the first dose and 16 weeks after the second dose.  Zoster vaccine. One dose is recommended for adults aged 87 years or older unless certain conditions are present.  Measles, mumps, and rubella (MMR) vaccine. Adults born before [birth year] generally are considered immune to measles and mumps. Adults born in [birth year] or later should have 1 or more doses of MMR vaccine unless there is a contraindication to the vaccine or there is laboratory evidence of immunity to each of the three diseases. A routine second dose of MMR vaccine should be obtained at least 28 days after the first dose for students attending postsecondary schools, health care workers, or international travelers. People who received inactivated measles vaccine or an unknown type of measles vaccine during 1963-1967 should receive 2 doses of MMR vaccine. People who received inactivated mumps vaccine or an unknown type of mumps vaccine before 1979 and are at high risk for mumps infection should consider immunization with 2 doses of MMR vaccine. For females of childbearing age, rubella immunity should be determined. If there is no evidence of immunity, females who are not pregnant should be vaccinated. If there is no evidence of immunity, females who are pregnant should delay immunization until after pregnancy. Unvaccinated  health care workers born before [birth year] who lack laboratory evidence of measles, mumps, or rubella immunity or laboratory confirmation of disease should consider measles and mumps immunization with 2 doses of MMR vaccine or rubella immunization with 1 dose of MMR vaccine.  Pneumococcal 13-valent conjugate (PCV13) vaccine. When indicated, a person who is uncertain of her immunization history and has no record of immunization should receive the PCV13 vaccine. An adult aged 48 years or older who has certain medical conditions and has not been previously immunized should receive 1 dose of PCV13 vaccine. This PCV13 should be followed with a dose of pneumococcal polysaccharide (PPSV23) vaccine. The PPSV23 vaccine dose should be obtained at least 8 weeks after the dose of PCV13 vaccine. An adult aged 40 years or older who has certain medical conditions and previously received 1 or more doses of PPSV23 vaccine should receive 1 dose of PCV13. The PCV13 vaccine dose should be obtained 1 or more years after the last PPSV23 vaccine dose.    Pneumococcal polysaccharide (PPSV23) vaccine. When PCV13 is also indicated, PCV13 should be obtained first. All adults aged 66 years and older should be immunized. An adult younger than age 28 years who has certain medical conditions should be immunized. Any person who resides in a nursing home or long-term care facility should be immunized. An adult smoker should be immunized. People with an immunocompromised condition and certain other conditions should receive both PCV13 and PPSV23 vaccines. People with human immunodeficiency virus (HIV) infection should be immunized as soon as possible after diagnosis. Immunization during chemotherapy or radiation therapy should be avoided. Routine use of PPSV23 vaccine is not recommended for American Indians, [place] Natives, or people younger than 65 years unless there are medical conditions that require PPSV23 vaccine. When indicated, people who  have unknown immunization and have no record of  immunization should receive PPSV23 vaccine. One-time revaccination 5 years after the first dose of PPSV23 is recommended for people aged 19-64 years who have chronic kidney failure, nephrotic syndrome, asplenia, or immunocompromised conditions. People who received 1-2 doses of PPSV23 before age 43 years should receive another dose of PPSV23 vaccine at age 75 years or later if at least 5 years have passed since the previous dose. Doses of PPSV23 are not needed for people immunized with PPSV23 at or after age 75 years.  Preventive Services / Frequency   Ages 38 to 43 years  Blood pressure check.  Lipid and cholesterol check.  Lung cancer screening. / Every year if you are aged 57-80 years and have a 30-pack-year history of smoking and currently smoke or have quit within the past 15 years. Yearly screening is stopped once you have quit smoking for at least 15 years or develop a health problem that would prevent you from having lung cancer treatment.  Clinical breast exam.** / Every year after age 95 years.  BRCA-related cancer risk assessment.** / For women who have family members with a BRCA-related cancer (breast, ovarian, tubal, or peritoneal cancers).  Mammogram.** / Every year beginning at age 63 years and continuing for as long as you are in good health. Consult with your health care provider.  Pap test.** / Every 3 years starting at age 87 years through age 20 or 53 years with a history of 3 consecutive normal Pap tests.  HPV screening.** / Every 3 years from ages 84 years through ages 19 to 62 years with a history of 3 consecutive normal Pap tests.  Fecal occult blood test (FOBT) of stool. / Every year beginning at age 50 years and continuing until age 59 years. You may not need to do this test if you get a colonoscopy every 10 years.  Flexible sigmoidoscopy or colonoscopy.** / Every 5 years for a flexible sigmoidoscopy or every 10 years  for a colonoscopy beginning at age 73 years and continuing until age 77 years.  Hepatitis C blood test.** / For all people born from [birth year] through 1964 and any individual with known risks for hepatitis C.  Skin self-exam. / Monthly.  Influenza vaccine. / Every year.  Tetanus, diphtheria, and acellular pertussis (Tdap/Td) vaccine.** / Consult your health care provider. Pregnant women should receive 1 dose of Tdap vaccine during each pregnancy. 1 dose of Td every 10 years.  Varicella vaccine.** / Consult your health care provider. Pregnant females who do not have evidence of immunity should receive the first dose after pregnancy.  Zoster vaccine.** / 1 dose for adults aged 32 years or older.  Pneumococcal 13-valent conjugate (PCV13) vaccine.** / Consult your health care provider.  Pneumococcal polysaccharide (PPSV23) vaccine.** / 1 to 2 doses if you smoke cigarettes or if you have certain conditions.  Meningococcal vaccine.** / Consult your health care provider.  Hepatitis A vaccine.** / Consult your health care provider.  Hepatitis B vaccine.** / Consult your health care provider. Screening for abdominal aortic aneurysm (AAA)  by ultrasound is recommended for people over 50 who have history of high blood pressure or who are current or former smokers.

## 2016-06-05 LAB — BASIC METABOLIC PANEL WITH GFR
BUN: 16 mg/dL (ref 7–25)
CO2: 28 mmol/L (ref 20–31)
Calcium: 9.8 mg/dL (ref 8.6–10.4)
Chloride: 104 mmol/L (ref 98–110)
Creat: 0.87 mg/dL (ref 0.50–1.05)
GFR, Est African American: 87 mL/min (ref >=60)
GFR, Est Non African American: 76 mL/min (ref >=60)
Glucose, Bld: 84 mg/dL (ref 65–99)
Potassium: 5.2 mmol/L (ref 3.5–5.3)
Sodium: 143 mmol/L (ref 135–146)

## 2016-06-05 LAB — HEPATIC FUNCTION PANEL
ALT: 9 U/L (ref 6–29)
AST: 15 U/L (ref 10–35)
Albumin: 4.6 g/dL (ref 3.6–5.1)
Alkaline Phosphatase: 35 U/L (ref 33–130)
Bilirubin, Direct: 0.1 mg/dL (ref <=0.2)
Indirect Bilirubin: 0.4 mg/dL (ref 0.2–1.2)
Total Bilirubin: 0.5 mg/dL (ref 0.2–1.2)
Total Protein: 7.4 g/dL (ref 6.1–8.1)

## 2016-06-05 LAB — URINALYSIS, ROUTINE W REFLEX MICROSCOPIC
Bilirubin Urine: NEGATIVE
Glucose, UA: NEGATIVE
Hgb urine dipstick: NEGATIVE
Ketones, ur: NEGATIVE
Leukocytes, UA: NEGATIVE
Nitrite: NEGATIVE
Protein, ur: NEGATIVE
Specific Gravity, Urine: 1.008 (ref 1.001–1.035)
pH: 7 (ref 5.0–8.0)

## 2016-06-05 LAB — IRON AND TIBC
%SAT: 21 % (ref 11–50)
Iron: 60 ug/dL (ref 45–160)
TIBC: 285 ug/dL (ref 250–450)
UIBC: 225 ug/dL (ref 125–400)

## 2016-06-05 LAB — LIPID PANEL
Cholesterol: 191 mg/dL (ref 125–200)
HDL: 81 mg/dL (ref >=46)
LDL Cholesterol: 98 mg/dL (ref <130)
Total CHOL/HDL Ratio: 2.4 Ratio (ref <=5.0)
Triglycerides: 59 mg/dL (ref <150)
VLDL: 12 mg/dL (ref <30)

## 2016-06-05 LAB — HEMOGLOBIN A1C
Hgb A1c MFr Bld: 5.7 % — ABNORMAL HIGH (ref <5.7)
Mean Plasma Glucose: 117 mg/dL

## 2016-06-05 LAB — INSULIN, RANDOM: Insulin: 1.3 u[IU]/mL — ABNORMAL LOW (ref 2.0–19.6)

## 2016-06-05 LAB — VITAMIN D 25 HYDROXY (VIT D DEFICIENCY, FRACTURES): Vit D, 25-Hydroxy: 58 ng/mL (ref 30–100)

## 2016-06-05 LAB — MICROALBUMIN / CREATININE URINE RATIO
Creatinine, Urine: 20 mg/dL (ref 20–320)
Microalb, Ur: <0.2 mg/dL

## 2016-06-05 LAB — MAGNESIUM: Magnesium: 2.3 mg/dL (ref 1.5–2.5)

## 2016-06-23 ENCOUNTER — Other Ambulatory Visit: Payer: Self-pay | Admitting: Internal Medicine

## 2016-06-30 ENCOUNTER — Other Ambulatory Visit: Payer: Self-pay

## 2016-06-30 DIAGNOSIS — Z0001 Encounter for general adult medical examination with abnormal findings: Secondary | ICD-10-CM

## 2016-06-30 DIAGNOSIS — Z1212 Encounter for screening for malignant neoplasm of rectum: Secondary | ICD-10-CM

## 2016-06-30 LAB — POC HEMOCCULT BLD/STL (HOME/3-CARD/SCREEN)
Card #2 Fecal Occult Blod, POC: NEGATIVE
Card #3 Fecal Occult Blood, POC: NEGATIVE
Fecal Occult Blood, POC: NEGATIVE

## 2016-07-04 LAB — CULTURE, FUNGUS WITHOUT SMEAR

## 2016-08-26 ENCOUNTER — Ambulatory Visit (INDEPENDENT_AMBULATORY_CARE_PROVIDER_SITE_OTHER): Payer: BLUE CROSS/BLUE SHIELD | Admitting: Podiatry

## 2016-08-26 ENCOUNTER — Ambulatory Visit (INDEPENDENT_AMBULATORY_CARE_PROVIDER_SITE_OTHER): Payer: BLUE CROSS/BLUE SHIELD

## 2016-08-26 ENCOUNTER — Encounter: Payer: Self-pay | Admitting: Podiatry

## 2016-08-26 VITALS — BP 120/73 | HR 78 | Resp 16

## 2016-08-26 DIAGNOSIS — M2580 Other specified joint disorders, unspecified joint: Secondary | ICD-10-CM | POA: Diagnosis not present

## 2016-08-26 DIAGNOSIS — M79671 Pain in right foot: Secondary | ICD-10-CM | POA: Diagnosis not present

## 2016-08-26 MED ORDER — MELOXICAM 15 MG PO TABS: 15.0000 mg | ORAL_TABLET | Freq: Every day | ORAL | 3 refills | Status: DC

## 2016-08-26 NOTE — Progress Notes (Signed)
   Subjective:    Patient ID: Beth Miranda, female    DOB: 07-Feb-1961, 55 y.o.   MRN: [id number]  HPI: She presents today as an inpatient with a chief complaint is pain sub-first metatarsophalangeal joint of the right foot 6 weeks. She denies any injuries but does state that she has recently increased her walking and her activity level. She hasn't tried Aleve which really helps temporarily.    Review of Systems  HENT: Positive for hearing loss.   Hematological: Bruises/bleeds easily.  All other systems reviewed and are negative.      Objective:   Physical Exam: Vital signs are stable she is alert and oriented 3. Pulses are strongly palpable. Neurologic sensorium is intact. Deep tendon reflexes are intact. Muscle strength is 5 over 5 dorsiflexion plantar flexors and inverters everters all into the musculature is intact. Orthopedic evaluation and this rates all joints distal to the ankle for range of motion without crepitation. Cutaneous evaluation of a straight supple well-hydrated cutis no erythema edema cellulitis drainage or odor. She does have pain on palpation of the tibial sesamoid of the right foot. Radiographs taken today of the bilateral foot demonstrates what appears to be a single bipart type tibial sesamoid right foot. There is no other fractures no signs of any osseous abnormalities. No sign of trauma or osteoarthritis. Cutaneous evaluation and straight supple well-hydrated cutis no lesions.        Assessment & Plan:  Sesamoiditis bipartite tibial sesamoid right foot. Capsulitis.  Plan: After sterile Betadine skin prep injected the dorsal aspect of the first metatarsophalangeal joint with dexamethasone and local anesthetic. Placed her in a arc O shoe for one month. Should this not alleviate her symptoms we will request an MRI.

## 2016-09-18 ENCOUNTER — Other Ambulatory Visit: Payer: Self-pay | Admitting: Internal Medicine

## 2016-10-03 ENCOUNTER — Other Ambulatory Visit: Payer: Self-pay | Admitting: Physician Assistant

## 2016-10-07 ENCOUNTER — Ambulatory Visit (INDEPENDENT_AMBULATORY_CARE_PROVIDER_SITE_OTHER): Payer: BLUE CROSS/BLUE SHIELD | Admitting: Podiatry

## 2016-10-07 ENCOUNTER — Encounter: Payer: Self-pay | Admitting: Podiatry

## 2016-10-07 DIAGNOSIS — M2580 Other specified joint disorders, unspecified joint: Secondary | ICD-10-CM | POA: Diagnosis not present

## 2016-10-07 NOTE — Progress Notes (Signed)
She presents today for follow-up of her sesamoiditis. We injected her dorsal aspect of the left first metatarsophalangeal joint last visit and placed her on meloxicam. She states that she really has not noticed any difference.  Objective: Vital signs are stable she is alert and oriented 3 presents with her Darco shoe today. Pulses remain palpable pain on palpation. She does have pain on direct palpation of the fibular sesamoid and the deep first intermetatarsal space.  Assessment: Fibular sesamoiditis cannot rule out fracture or avascular necrosis or osteoarthritis.  Plan: I injected the area to the plantar medial aspect today with Kenalog and local anesthetic patient stated that we were right on top of the pain. She will continue to wear the Darco shoe for the next 3 weeks. If not improved and MRI will be necessary.

## 2016-10-28 ENCOUNTER — Encounter: Payer: Self-pay | Admitting: Podiatry

## 2016-10-28 ENCOUNTER — Ambulatory Visit (INDEPENDENT_AMBULATORY_CARE_PROVIDER_SITE_OTHER): Payer: BLUE CROSS/BLUE SHIELD | Admitting: Podiatry

## 2016-10-28 DIAGNOSIS — M2580 Other specified joint disorders, unspecified joint: Secondary | ICD-10-CM | POA: Diagnosis not present

## 2016-10-28 NOTE — Progress Notes (Signed)
She presents today stating that her sesamoiditis has resolved to approximately 85%. As she refers to her right first metatarsal phalangeal joint plantarly. She states that it does not hurt every day and is just occasionally that it bothers her as she does continue to wear the Darco shoe to the best of her ability.  Objective: Pulses are palpable she has much less pain on palpation sesamoid right foot.  Assessment: Resolving sesamoiditis.  Plan: Encouraged the use of a stiff soled shoe and we'll put her down to be seen in 3-4 weeks if she is not continually getting better.

## 2016-11-13 ENCOUNTER — Ambulatory Visit (INDEPENDENT_AMBULATORY_CARE_PROVIDER_SITE_OTHER): Payer: BLUE CROSS/BLUE SHIELD | Admitting: Internal Medicine

## 2016-11-13 ENCOUNTER — Encounter: Payer: Self-pay | Admitting: Internal Medicine

## 2016-11-13 VITALS — BP 110/60 | Temp 98.4°F | Resp 16 | Ht 64.0 in | Wt 137.8 lb

## 2016-11-13 DIAGNOSIS — K529 Noninfective gastroenteritis and colitis, unspecified: Secondary | ICD-10-CM | POA: Diagnosis not present

## 2016-11-13 MED ORDER — DICYCLOMINE HCL 20 MG PO TABS
20.0000 mg | ORAL_TABLET | Freq: Three times a day (TID) | ORAL | 0 refills | Status: DC | PRN
Start: 2016-11-13 — End: 2017-07-01

## 2016-11-13 MED ORDER — FLUTICASONE PROPIONATE 50 MCG/ACT NA SUSP: 2.0000 | Freq: Every day | NASAL | 0 refills | Status: DC

## 2016-11-13 MED ORDER — ONDANSETRON 8 MG PO TBDP: ORAL_TABLET | ORAL | 1 refills | Status: DC

## 2016-11-13 NOTE — Patient Instructions (Addendum)
Food Choices to Help Relieve Diarrhea, Adult When you have diarrhea, the foods you eat and your eating habits are very important. Choosing the right foods and drinks can help relieve diarrhea. Also, because diarrhea can last up to 7 days, you need to replace lost fluids and electrolytes (such as sodium, potassium, and chloride) in order to help prevent dehydration. What general guidelines do I need to follow?  Slowly drink 1 cup (8 oz) of fluid for each episode of diarrhea. If you are getting enough fluid, your urine will be clear or pale yellow.  Eat starchy foods. Some good choices include white rice, white toast, pasta, low-fiber cereal, baked potatoes (without the skin), saltine crackers, and bagels.  Avoid large servings of any cooked vegetables.  Limit fruit to two servings per day. A serving is  cup or 1 small piece.  Choose foods with less than 2 g of fiber per serving.  Limit fats to less than 8 tsp (38 g) per day.  Avoid fried foods.  Eat foods that have probiotics in them. Probiotics can be found in certain dairy products.  Avoid foods and beverages that may increase the speed at which food moves through the stomach and intestines (gastrointestinal tract). Things to avoid include:  High-fiber foods, such as dried fruit, raw fruits and vegetables, nuts, seeds, and whole grain foods.  Spicy foods and high-fat foods.  Foods and beverages sweetened with high-fructose corn syrup, honey, or sugar alcohols such as xylitol, sorbitol, and mannitol. What foods are recommended? Grains  White rice. White, French, or pita breads (fresh or toasted), including plain rolls, buns, or bagels. White pasta. Saltine, soda, or graham crackers. Pretzels. Low-fiber cereal. Cooked cereals made with water (such as cornmeal, farina, or cream cereals). Plain muffins. Matzo. Melba toast. Zwieback. Vegetables  Potatoes (without the skin). Strained tomato and vegetable juices. Most well-cooked and canned  vegetables without seeds. Tender lettuce. Fruits  Cooked or canned applesauce, apricots, cherries, fruit cocktail, grapefruit, peaches, pears, or plums. Fresh bananas, apples without skin, cherries, grapes, cantaloupe, grapefruit, peaches, oranges, or plums. Meat and Other Protein Products  Baked or boiled chicken. Eggs. Tofu. Fish. Seafood. Smooth peanut butter. Ground or well-cooked tender beef, ham, veal, lamb, pork, or poultry. Dairy  Plain yogurt, kefir, and unsweetened liquid yogurt. Lactose-free milk, buttermilk, or soy milk. Plain hard cheese. Beverages  Sport drinks. Clear broths. Diluted fruit juices (except prune). Regular, caffeine-free sodas such as ginger ale. Water. Decaffeinated teas. Oral rehydration solutions. Sugar-free beverages not sweetened with sugar alcohols. Other  Bouillon, broth, or soups made from recommended foods. The items listed above may not be a complete list of recommended foods or beverages. Contact your dietitian for more options.  What foods are not recommended? Grains  Whole grain, whole wheat, bran, or rye breads, rolls, pastas, crackers, and cereals. Wild or brown rice. Cereals that contain more than 2 g of fiber per serving. Corn tortillas or taco shells. Cooked or dry oatmeal. Granola. Popcorn. Vegetables  Raw vegetables. Cabbage, broccoli, Brussels sprouts, artichokes, baked beans, beet greens, corn, kale, legumes, peas, sweet potatoes, and yams. Potato skins. Cooked spinach and cabbage. Fruits  Dried fruit, including raisins and dates. Raw fruits. Stewed or dried prunes. Fresh apples with skin, apricots, mangoes, pears, raspberries, and strawberries. Meat and Other Protein Products  Chunky peanut butter. Nuts and seeds. Beans and lentils. Bacon. Dairy  High-fat cheeses. Milk, chocolate milk, and beverages made with milk, such as milk shakes. Cream. Ice cream. Sweets and Desserts    Sweet rolls, doughnuts, and sweet breads. Pancakes and waffles. Fats  and Oils  Butter. Cream sauces. Margarine. Salad oils. Plain salad dressings. Olives. Avocados. Beverages  Caffeinated beverages (such as coffee, tea, soda, or energy drinks). Alcoholic beverages. Fruit juices with pulp. Prune juice. Soft drinks sweetened with high-fructose corn syrup or sugar alcohols. Other  Coconut. Hot sauce. Chili powder. Mayonnaise. Gravy. Cream-based or milk-based soups. The items listed above may not be a complete list of foods and beverages to avoid. Contact your dietitian for more information.  What should I do if I become dehydrated? Diarrhea can sometimes lead to dehydration. Signs of dehydration include dark urine and dry mouth and skin. If you think you are dehydrated, you should rehydrate with an oral rehydration solution. These solutions can be purchased at pharmacies, retail stores, or online. Drink -1 cup (120-240 mL) of oral rehydration solution each time you have an episode of diarrhea. If drinking this amount makes your diarrhea worse, try drinking smaller amounts more often. For example, drink 1-3 tsp (5-15 mL) every 5-10 minutes. A general rule for staying hydrated is to drink 1-2 L of fluid per day. Talk to your health care provider about the specific amount you should be drinking each day. Drink enough fluids to keep your urine clear or pale yellow. This information is not intended to replace advice given to you by your health care provider. Make sure you discuss any questions you have with your health care provider. Document Released: 02/21/2004 Document Revised: 05/08/2016 Document Reviewed: 10/24/2013 Elsevier Interactive Patient Education  2017 [employer]   Viral Gastroenteritis, Adult Introduction Viral gastroenteritis is also known as the stomach flu. This condition is caused by certain germs (viruses). These germs can be passed from person to person very easily (are very contagious). This condition can cause sudden watery poop (diarrhea),  fever, and throwing up (vomiting). Having watery poop and throwing up can make you feel weak and cause you to get dehydrated. Dehydration can make you tired and thirsty, make you have a dry mouth, and make it so you pee (urinate) less often. Older adults and people with other diseases or a weak defense system (immune system) are at higher risk for dehydration. It is important to replace the fluids that you lose from having watery poop and throwing up. Follow these instructions at home: Follow instructions from your doctor about how to care for yourself at home. Eating and drinking Follow these instructions as told by your doctor:  Take an oral rehydration solution (ORS). This is a drink that is sold at pharmacies and stores.  Drink clear fluids in small amounts as you are able, such as:  Water.  Ice chips.  Diluted fruit juice.  Low-calorie sports drinks.  Eat bland, easy-to-digest foods in small amounts as you are able, such as:  Bananas.  Applesauce.  Rice.  Low-fat (lean) meats.  Toast.  Crackers.  Avoid fluids that have a lot of sugar or caffeine in them.  Avoid alcohol.  Avoid spicy or fatty foods. General instructions  Drink enough fluid to keep your pee (urine) clear or pale yellow.  Wash your hands often. If you cannot use soap and water, use hand sanitizer.  Make sure that all people in your home wash their hands well and often.  Rest at home while you get better.  Take over-the-counter and prescription medicines only as told by your doctor.  Watch your condition for any changes.  Take a warm bath to help with  any burning or pain from having watery poop.  Keep all follow-up visits as told by your doctor. This is important. Contact a doctor if:  You cannot keep fluids down.  Your symptoms get worse.  You have new symptoms.  You feel light-headed or dizzy.  You have muscle cramps. Get help right away if:  You have chest pain.  You feel very  weak or you pass out (faint).  You see blood in your throw-up.  Your throw-up looks like coffee grounds.  You have bloody or black poop (stools) or poop that look like tar.  You have a very bad headache, a stiff neck, or both.  You have a rash.  You have very bad pain, cramping, or bloating in your belly (abdomen).  You have trouble breathing.  You are breathing very quickly.  Your heart is beating very quickly.  Your skin feels cold and clammy.  You feel confused.  You have pain when you pee.  You have signs of dehydration, such as:  Dark pee, hardly any pee, or no pee.  Cracked lips.  Dry mouth.  Sunken eyes.  Sleepiness.  Weakness. This information is not intended to replace advice given to you by your health care provider. Make sure you discuss any questions you have with your health care provider. Document Released: 05/19/2008 Document Revised: 06/20/2016 Document Reviewed: 08/07/2015  2017 Elsevier

## 2016-11-13 NOTE — Progress Notes (Signed)
   Subjective:    Patient ID: Beth Miranda, female    DOB: 1961-04-30, 55 y.o.   MRN: [id number]  HPI  Patient presents to the office for evaluation of nausea and diarrhea.  She reports that this started on Monday after eating out.  She has been having loose stools which turned to watery diarrhea.  She has been having 3 stools per day.  She has been having a lot of nausea.  She has not vomited.  She is having some anorexia with it.  She does feel weak and tired and has been having some headaches and sore throat.  She reports that she has not been taking any over the counter medications.  She was able to keep some water and gatorade down.  She did eat some soup last night.  Last BM was this morning.  She reports that she has been having some queasiness and rumbling in the stomach.  She reports that she has not had blood in her stools or black colored stools.  She reports that her husband had very similar symptoms but he is starting to feel better.     Review of Systems  Constitutional: Negative for chills, fatigue and fever.  HENT: Positive for congestion and rhinorrhea.   Eyes: Positive for itching.  Respiratory: Negative for chest tightness, shortness of breath and wheezing.   Cardiovascular: Negative for chest pain and palpitations.  Gastrointestinal: Positive for abdominal pain, diarrhea and nausea. Negative for abdominal distention, anal bleeding, blood in stool, constipation and vomiting.  Genitourinary: Negative for dysuria, frequency and urgency.  Skin: Negative for rash.  Neurological: Positive for headaches. Negative for dizziness and light-headedness.       Objective:   Physical Exam  Constitutional: She is oriented to person, place, and time. She appears well-developed and well-nourished. No distress.  HENT:  Head: Normocephalic.  Mouth/Throat: Oropharynx is clear and moist. No oropharyngeal exudate.  Eyes: Conjunctivae are normal. No scleral icterus.  Neck: Normal range of  motion. Neck supple. No JVD present. No thyromegaly present.  Cardiovascular: Normal rate, regular rhythm, normal heart sounds and intact distal pulses.  Exam reveals no gallop and no friction rub.   No murmur heard. Pulmonary/Chest: Effort normal and breath sounds normal. No respiratory distress. She has no wheezes. She has no rales. She exhibits no tenderness.  Abdominal: Soft. Normal appearance and bowel sounds are normal. She exhibits no distension and no mass. There is no tenderness. There is no rigidity, no rebound, no guarding, no CVA tenderness, no tenderness at McBurney's point and negative Murphy's sign.  Mild tenderness to lower abdomen reported, but no flinching, guarding or visible pain on patient's face.    Musculoskeletal: Normal range of motion.  Lymphadenopathy:    She has no cervical adenopathy.  Neurological: She is alert and oriented to person, place, and time.  Skin: Skin is warm and dry. She is not diaphoretic.  Psychiatric: She has a normal mood and affect. Her behavior is normal. Judgment and thought content normal.  Nursing note and vitals reviewed.   Vitals:   11/13/16 1353  BP: 110/60  Resp: 16  Temp: 98.4 F (36.9 C)         Assessment & Plan:    1. Gastroenteritis -likely viral gastroenteritis given history -no red flags on abd exam -bentyl -zofran -brat diet -slowly advance diet -push fluids -if worsening abd pain, intractable N/V or any blood in stool patient to go to the ER.

## 2016-11-18 ENCOUNTER — Encounter: Payer: Self-pay | Admitting: Podiatry

## 2016-11-18 ENCOUNTER — Ambulatory Visit (INDEPENDENT_AMBULATORY_CARE_PROVIDER_SITE_OTHER): Payer: BLUE CROSS/BLUE SHIELD | Admitting: Podiatry

## 2016-11-18 DIAGNOSIS — M2580 Other specified joint disorders, unspecified joint: Secondary | ICD-10-CM | POA: Diagnosis not present

## 2016-11-19 NOTE — Progress Notes (Signed)
She presents today for follow-up of sesamoiditis right foot. She states it is 90% improved.  Objective: Vital signs are stable alert and 3. She has tenderness on palpation of the suspensory ligament to the fibular sesamoid right foot.  Assessment: Fibular sesamoiditis.  Plan: Encouraged continued conservative therapies at this point. Follow me as needed

## 2016-12-04 ENCOUNTER — Ambulatory Visit: Payer: Self-pay | Admitting: Internal Medicine

## 2016-12-17 ENCOUNTER — Ambulatory Visit: Payer: Self-pay | Admitting: Internal Medicine

## 2016-12-18 ENCOUNTER — Encounter: Payer: Self-pay | Admitting: Physician Assistant

## 2016-12-18 ENCOUNTER — Other Ambulatory Visit: Payer: Self-pay | Admitting: Physician Assistant

## 2016-12-18 MED ORDER — PROMETHAZINE-DM 6.25-15 MG/5ML PO SYRP: 5.0000 mL | ORAL_SOLUTION | Freq: Four times a day (QID) | ORAL | 1 refills | Status: DC | PRN

## 2016-12-18 MED ORDER — PREDNISONE 20 MG PO TABS: ORAL_TABLET | ORAL | 0 refills | Status: DC

## 2017-02-17 ENCOUNTER — Encounter: Payer: Self-pay | Admitting: *Deleted

## 2017-03-20 ENCOUNTER — Other Ambulatory Visit: Payer: Self-pay | Admitting: Physician Assistant

## 2017-04-20 ENCOUNTER — Other Ambulatory Visit: Payer: Self-pay | Admitting: Internal Medicine

## 2017-04-20 DIAGNOSIS — Z1231 Encounter for screening mammogram for malignant neoplasm of breast: Secondary | ICD-10-CM

## 2017-04-29 ENCOUNTER — Encounter: Payer: Self-pay | Admitting: Gynecology

## 2017-05-04 ENCOUNTER — Encounter: Payer: Self-pay | Admitting: Gynecology

## 2017-05-04 ENCOUNTER — Ambulatory Visit (INDEPENDENT_AMBULATORY_CARE_PROVIDER_SITE_OTHER): Payer: BLUE CROSS/BLUE SHIELD | Admitting: Gynecology

## 2017-05-04 VITALS — BP 120/74 | Ht 65.0 in | Wt 140.0 lb

## 2017-05-04 DIAGNOSIS — M8580 Other specified disorders of bone density and structure, unspecified site: Secondary | ICD-10-CM

## 2017-05-04 DIAGNOSIS — Z01419 Encounter for gynecological examination (general) (routine) without abnormal findings: Secondary | ICD-10-CM | POA: Diagnosis not present

## 2017-05-04 DIAGNOSIS — Z79890 Hormone replacement therapy (postmenopausal): Secondary | ICD-10-CM

## 2017-05-04 DIAGNOSIS — Z8639 Personal history of other endocrine, nutritional and metabolic disease: Secondary | ICD-10-CM | POA: Diagnosis not present

## 2017-05-04 MED ORDER — PROGESTERONE MICRONIZED 200 MG PO CAPS: ORAL_CAPSULE | ORAL | 11 refills | Status: DC

## 2017-05-04 MED ORDER — ESTRADIOL 2 MG PO TABS
2.0000 mg | ORAL_TABLET | Freq: Every day | ORAL | 11 refills | Status: DC
Start: 2017-05-04 — End: 2018-05-05

## 2017-05-04 NOTE — Progress Notes (Signed)
Beth Miranda [birth year] [id number]   History:    56 y.o.  for annual gyn exam who is asymptomatic today. She has done well on hormonal replacement therapy. She is currently taking Estrace 2 mg daily with the addition of Prometrium 100 mg for 12 days a month and reports no vaginal bleeding.Review of patient's bone density study in 2016 had indicated that her lowest T score was at the right femoral neck with a value of -1.4 and normal Frax analysis.Patient has history of hereditary an idiopathic peripheral neuropathy and is borderline diabetic. Patient with no past history of any abnormal Pap smear. Her colonoscopy is up-to-date and she is on a 10 year recall.  Past medical history,surgical history, family history and social history were all reviewed and documented in the EPIC chart.  Gynecologic History Patient's last menstrual period was 03/02/2013. Contraception: post menopausal status Last Pap: 2016. Results were: normal Last mammogram: 2017. Results were: normal  Obstetric History OB History  Gravida Para Term Preterm AB Living  0 0 0 0 0 0  SAB TAB Ectopic Multiple Live Births  0 0 0 0           ROS: A ROS was performed and pertinent positives and negatives are included in the history.  GENERAL: No fevers or chills. HEENT: No change in vision, no earache, sore throat or sinus congestion. NECK: No pain or stiffness. CARDIOVASCULAR: No chest pain or pressure. No palpitations. PULMONARY: No shortness of breath, cough or wheeze. GASTROINTESTINAL: No abdominal pain, nausea, vomiting or diarrhea, melena or bright red blood per rectum. GENITOURINARY: No urinary frequency, urgency, hesitancy or dysuria. MUSCULOSKELETAL: No joint or muscle pain, no back pain, no recent trauma. DERMATOLOGIC: No rash, no itching, no lesions. ENDOCRINE: No polyuria, polydipsia, no heat or cold intolerance. No recent change in weight. HEMATOLOGICAL: No anemia or easy bruising or bleeding. NEUROLOGIC: No  headache, seizures, numbness, tingling or weakness. PSYCHIATRIC: No depression, no loss of interest in normal activity or change in sleep pattern.     Exam: chaperone present  BP 120/74   Ht 5\' 5"  (1.651 m)   Wt 140 lb (63.5 kg)   LMP 03/02/2013   BMI 23.30 kg/m   Body mass index is 23.3 kg/m.  General appearance : Well developed well nourished female. No acute distress HEENT: Eyes: no retinal hemorrhage or exudates,  Neck supple, trachea midline, no carotid bruits, no thyroidmegaly Lungs: Clear to auscultation, no rhonchi or wheezes, or rib retractions  Heart: Regular rate and rhythm, no murmurs or gallops Breast:Examined in sitting and supine position were symmetrical in appearance, no palpable masses or tenderness,  no skin retraction, no nipple inversion, no nipple discharge, no skin discoloration, no axillary or supraclavicular lymphadenopathy Abdomen: no palpable masses or tenderness, no rebound or guarding Extremities: no edema or skin discoloration or tenderness  Pelvic:  Bartholin, Urethra, Skene Glands: Within normal limits             Vagina: No gross lesions or discharge  Cervix: No gross lesions or discharge  Uterus  anteverted, normal size, shape and consistency, non-tender and mobile  Adnexa  Without masses or tenderness  Anus and perineum  normal   Rectovaginal  normal sphincter tone without palpated masses or tenderness             Hemoccult PCP provides     Assessment/Plan:  56 y.o. female for annual exam with history of osteopenia due for her bone density study this July.  Her PCP has been monitor her vitamin D deficiency during the remainder of her blood work and she scheduled to see him in June. Pap smear not done this year due next year. Prescription refill for Estrace and Prometrium provided. She'll scheduled for her 3-D mammogram next month. We discussed importance of calcium vitamin D and weightbearing exercises for osteoporosis  prevention.   Terrance Mass MD, 3:11 PM 05/04/2017

## 2017-05-04 NOTE — Patient Instructions (Signed)
Bone Densitometry Bone densitometry is an imaging test that uses a special X-ray to measure the amount of calcium and other minerals in your bones (bone density). This test is also known as a bone mineral density test or dual-energy X-ray absorptiometry (DXA). The test can measure bone density at your hip and your spine. It is similar to having a regular X-ray. You may have this test to:  Diagnose a condition that causes weak or thin bones (osteoporosis).  Predict your risk of a broken bone (fracture).  Determine how well osteoporosis treatment is working. Tell a health care provider about:  Any allergies you have.  All medicines you are taking, including vitamins, herbs, eye drops, creams, and over-the-counter medicines.  Any problems you or family members have had with anesthetic medicines.  Any blood disorders you have.  Any surgeries you have had.  Any medical conditions you have.  Possibility of pregnancy.  Any other medical test you had within the previous 14 days that used contrast material. What are the risks? Generally, this is a safe procedure. However, problems can occur and may include the following:  This test exposes you to a very small amount of radiation.  The risks of radiation exposure may be greater to unborn children. What happens before the procedure?  Do not take any calcium supplements for 24 hours before having the test. You can otherwise eat and drink what you usually do.  Take off all metal jewelry, eyeglasses, dental appliances, and any other metal objects. What happens during the procedure?  You may lie on an exam table. There will be an X-ray generator below you and an imaging device above you.  Other devices, such as boxes or braces, may be used to position your body properly for the scan.  You will need to lie still while the machine slowly scans your body.  The images will show up on a computer monitor. What happens after the  procedure? You may need more testing at a later time. This information is not intended to replace advice given to you by your health care provider. Make sure you discuss any questions you have with your health care provider. Document Released: 12/23/2004 Document Revised: 05/08/2016 Document Reviewed: 05/11/2014 Elsevier Interactive Patient Education  2017 [employer]  

## 2017-05-05 ENCOUNTER — Other Ambulatory Visit: Payer: Self-pay | Admitting: Internal Medicine

## 2017-06-02 ENCOUNTER — Other Ambulatory Visit: Payer: Self-pay | Admitting: Gynecology

## 2017-06-02 ENCOUNTER — Ambulatory Visit (INDEPENDENT_AMBULATORY_CARE_PROVIDER_SITE_OTHER): Payer: BLUE CROSS/BLUE SHIELD

## 2017-06-02 DIAGNOSIS — Z13820 Encounter for screening for osteoporosis: Secondary | ICD-10-CM | POA: Diagnosis not present

## 2017-06-02 DIAGNOSIS — M8589 Other specified disorders of bone density and structure, multiple sites: Secondary | ICD-10-CM

## 2017-06-02 DIAGNOSIS — Z8639 Personal history of other endocrine, nutritional and metabolic disease: Secondary | ICD-10-CM | POA: Diagnosis not present

## 2017-06-02 DIAGNOSIS — M8580 Other specified disorders of bone density and structure, unspecified site: Secondary | ICD-10-CM

## 2017-06-02 DIAGNOSIS — Z79890 Hormone replacement therapy (postmenopausal): Secondary | ICD-10-CM

## 2017-06-03 ENCOUNTER — Ambulatory Visit
Admission: RE | Admit: 2017-06-03 | Discharge: 2017-06-03 | Disposition: A | Discharge status: Home / Self Care | Payer: BLUE CROSS/BLUE SHIELD | Source: Ambulatory Visit | Attending: Internal Medicine | Admitting: Internal Medicine

## 2017-06-03 DIAGNOSIS — Z1231 Encounter for screening mammogram for malignant neoplasm of breast: Secondary | ICD-10-CM

## 2017-07-01 ENCOUNTER — Ambulatory Visit (INDEPENDENT_AMBULATORY_CARE_PROVIDER_SITE_OTHER): Payer: BLUE CROSS/BLUE SHIELD | Admitting: Internal Medicine

## 2017-07-01 VITALS — BP 124/86 | HR 72 | Temp 97.5°F | Resp 16 | Ht 64.0 in | Wt 139.2 lb

## 2017-07-01 DIAGNOSIS — Z136 Encounter for screening for cardiovascular disorders: Secondary | ICD-10-CM

## 2017-07-01 DIAGNOSIS — E782 Mixed hyperlipidemia: Secondary | ICD-10-CM

## 2017-07-01 DIAGNOSIS — Z111 Encounter for screening for respiratory tuberculosis: Secondary | ICD-10-CM | POA: Diagnosis not present

## 2017-07-01 DIAGNOSIS — R0989 Other specified symptoms and signs involving the circulatory and respiratory systems: Secondary | ICD-10-CM

## 2017-07-01 DIAGNOSIS — Z23 Encounter for immunization: Secondary | ICD-10-CM | POA: Diagnosis not present

## 2017-07-01 DIAGNOSIS — R7303 Prediabetes: Secondary | ICD-10-CM

## 2017-07-01 DIAGNOSIS — K219 Gastro-esophageal reflux disease without esophagitis: Secondary | ICD-10-CM

## 2017-07-01 DIAGNOSIS — Z79899 Other long term (current) drug therapy: Secondary | ICD-10-CM | POA: Diagnosis not present

## 2017-07-01 DIAGNOSIS — I10 Essential (primary) hypertension: Secondary | ICD-10-CM

## 2017-07-01 DIAGNOSIS — R5383 Other fatigue: Secondary | ICD-10-CM

## 2017-07-01 DIAGNOSIS — Z0000 Encounter for general adult medical examination without abnormal findings: Secondary | ICD-10-CM

## 2017-07-01 DIAGNOSIS — E039 Hypothyroidism, unspecified: Secondary | ICD-10-CM

## 2017-07-01 DIAGNOSIS — Z0001 Encounter for general adult medical examination with abnormal findings: Secondary | ICD-10-CM

## 2017-07-01 DIAGNOSIS — Z1212 Encounter for screening for malignant neoplasm of rectum: Secondary | ICD-10-CM

## 2017-07-01 DIAGNOSIS — E559 Vitamin D deficiency, unspecified: Secondary | ICD-10-CM

## 2017-07-01 LAB — CBC WITH DIFFERENTIAL/PLATELET
Basophils Absolute: 39 cells/uL (ref 0–200)
Basophils Relative: 1 %
Eosinophils Absolute: 78 cells/uL (ref 15–500)
Eosinophils Relative: 2 %
HCT: 41 % (ref 35.0–45.0)
Hemoglobin: 13.2 g/dL (ref 11.7–15.5)
Lymphocytes Relative: 33 %
Lymphs Abs: 1287 cells/uL (ref 850–3900)
MCH: 29 pg (ref 27.0–33.0)
MCHC: 32.2 g/dL (ref 32.0–36.0)
MCV: 90.1 fL (ref 80.0–100.0)
MPV: 9.6 fL (ref 7.5–12.5)
Monocytes Absolute: 351 cells/uL (ref 200–950)
Monocytes Relative: 9 %
Neutro Abs: 2145 cells/uL (ref 1500–7800)
Neutrophils Relative %: 55 %
Platelets: 240 10*3/uL (ref 140–400)
RBC: 4.55 MIL/uL (ref 3.80–5.10)
RDW: 13.6 % (ref 11.0–15.0)
WBC: 3.9 10*3/uL (ref 3.8–10.8)

## 2017-07-01 NOTE — Patient Instructions (Signed)

## 2017-07-01 NOTE — Progress Notes (Signed)
[facility] Unk Pinto, M.D.      Uvaldo Bristle. Silverio Lay, P.A.-C [facility]                [address] [place], [place] [address] Telephone [phone] Telefax [phone]  Annual Screening/Preventative Visit & Comprehensive Evaluation &  Examination     This very nice 56 y.o. MWF presents for a Screening/Preventative Visit & comprehensive evaluation and management of multiple medical co-morbidities.  Patient has been followed for labile HTN, Prediabetes, Hyperlipidemia, Hypothyroidism and Vitamin D Deficiency. Patient has GERD controlled with diet & meds.      Patient is also on Thyroid replacement. 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      Patient has been followed expectantly for labile HTN  since 2000. Patient's BP has been controlled at home and patient denies any cardiac symptoms as chest pain, palpitations, shortness of breath, dizziness or ankle swelling. Today's BP is at goal - 124/86.      Patient's hyperlipidemia is controlled with diet and medications. Patient denies myalgias or other medication SE's. Last lipids were at goal: Lab Results  Component Value Date   CHOL 191 06/04/2016   HDL 81 06/04/2016   LDLCALC 98 06/04/2016   TRIG 59 06/04/2016   CHOLHDL 2.4 06/04/2016      Patient has prediabetes (A1c 6.0% in 2012 and 5.9% in 2016) and patient denies reactive hypoglycemic symptoms, visual blurring, diabetic polys, or paresthesias. Last A1c almost to goal: Lab Results  Component Value Date   HGBA1C 5.7 (H) 06/04/2016      Patient has been on Thyroid replacement since 2010. Finally, patient has history of Vitamin D Deficiency and last Vitamin D was near goal (70-100): Lab Results  Component Value Date   VD25OH 58 06/04/2016   Current Outpatient Prescriptions on File Prior to  Visit  Medication Sig  . aspirin 81 MG tablet Take 81 mg by mouth daily.  . Cetirizine HCl (ZYRTEC ALLERGY PO) Take 10 mg by mouth at bedtime.  . Cholecalciferol (VITAMIN D) 2000 UNITS tablet Take 2,000 Units by mouth daily.   . Cinnamon 500 MG capsule Take 2,000 mg by mouth daily.  . Cyanocobalamin (VITAMIN B 12 PO) Take 1,000 mcg by mouth daily.  Marland Kitchen estradiol (ESTRACE) 2 MG tablet Take 1 tablet (2 mg total) by mouth daily.  Marland Kitchen levothyroxine (SYNTHROID, LEVOTHROID) 50 MCG tablet TAKE ONE TABLET BY MOUTH ONCE DAILY.  . Magnesium 400 MG TABS Take 1 tablet by mouth 2 (two) times daily.  . Omega-3 Fatty Acids (OMEGA-3 FISH OIL) 1200 MG CAPS Take 2 capsules by mouth daily.  . progesterone (PROMETRIUM) 200 MG capsule TAKE 1 CAPSULE BY MOUTH ON THE FIRST 12 DAYS OF THE MONTH.  . ranitidine (ZANTAC) 300 MG tablet TAKE 1 TABLET BY MOUTH DAILY AT BEDTIME AS NEEDED FOR INDIGESTION.  . Red Yeast Rice Extract (RED YEAST RICE PO) Take 2,400 mg by mouth daily.    No current facility-administered medications on file prior to visit.    Allergies  Allergen Reactions  . Itraconazole   . Lamisil [Terbinafine Hcl] Hives  . Penicillins Hives  . Sulfa Antibiotics Hives   Past Medical History:  Diagnosis Date  . Allergy    seasonal  . GERD (gastroesophageal reflux disease)   .  Hyperlipidemia   . Labile hypertension   . Prediabetes   . Raynaud's disease   . Thyroid disease    hypothyroid  . Vitamin D deficiency    Health Maintenance  Topic Date Due  . TETANUS/TDAP  01/06/2017  . INFLUENZA VACCINE  07/15/2017  . PAP SMEAR  05/01/2018  . MAMMOGRAM  06/04/2019  . COLONOSCOPY  05/28/2022  . Hepatitis C Screening  Completed  . HIV Screening  Completed   Immunization History  Administered Date(s) Administered  . Hepatitis B 06/06/2002, 07/07/2002, 12/29/2002  . PPD Test 05/09/2014, 05/10/2015, 06/04/2016  . Pneumococcal-Unspecified 12/15/1992  . Td 01/06/2007   Past Surgical History:   Procedure Laterality Date  . TONSILLECTOMY     Family History  Problem Relation Age of Onset  . Pancreatic cancer Paternal Grandmother   . Arthritis Mother        Living  . COPD Mother   . Asthma Mother   . Hypertension Father        Living  . Diabetes Father   . Kidney disease Father        Stones  . Hyperlipidemia Father   . Other Brother        Deceased  . Breast cancer Neg Hx    Social History  Substance Use Topics  . Smoking status: Never Smoker  . Smokeless tobacco: Never Used  . Alcohol use 0.6 oz/week    1 Standard drinks or equivalent per week     Comment: 2 glasses  per week    ROS Constitutional: Denies fever, chills, weight loss/gain, headaches, insomnia,  night sweats, and change in appetite. Does c/o fatigue. Eyes: Denies redness, blurred vision, diplopia, discharge, itchy, watery eyes.  ENT: Denies discharge, congestion, post nasal drip, epistaxis, sore throat, earache, hearing loss, dental pain, Tinnitus, Vertigo, Sinus pain, snoring.  Cardio: Denies chest pain, palpitations, irregular heartbeat, syncope, dyspnea, diaphoresis, orthopnea, PND, claudication, edema Respiratory: denies cough, dyspnea, DOE, pleurisy, hoarseness, laryngitis, wheezing.  Gastrointestinal: Denies dysphagia, heartburn, reflux, water brash, pain, cramps, nausea, vomiting, bloating, diarrhea, constipation, hematemesis, melena, hematochezia, jaundice, hemorrhoids Genitourinary: Denies dysuria, frequency, urgency, nocturia, hesitancy, discharge, hematuria, flank pain Breast: Breast lumps, nipple discharge, bleeding.  Musculoskeletal: Denies arthralgia, myalgia, stiffness, Jt. Swelling, pain, limp, and strain/sprain. Denies falls. Skin: Denies puritis, rash, hives, warts, acne, eczema, changing in skin lesion Neuro: No weakness, tremor, incoordination, spasms, paresthesia, pain Psychiatric: Denies confusion, memory loss, sensory loss. Denies Depression. Endocrine: Denies change in weight,  skin, hair change, nocturia, and paresthesia, diabetic polys, visual blurring, hyper / hypo glycemic episodes.  Heme/Lymph: No excessive bleeding, bruising, enlarged lymph nodes.  Physical Exam  BP 124/86   Pulse 72   Temp (!) 97.5 F (36.4 C)   Resp 16   Ht 5\' 4"  (1.626 m)   Wt 139 lb 3.2 oz (63.1 kg)   LMP 03/02/2013   BMI 23.89 kg/m   General Appearance: Well nourished, well groomed and in no apparent distress.  Eyes: PERRLA, EOMs, conjunctiva no swelling or erythema, normal fundi and vessels. Sinuses: No frontal/maxillary tenderness ENT/Mouth: EACs patent / TMs  nl. Nares clear without erythema, swelling, mucoid exudates. Oral hygiene is good. No erythema, swelling, or exudate. Tongue normal, non-obstructing. Tonsils not swollen or erythematous. Hearing normal.  Neck: Supple, thyroid normal. No bruits, nodes or JVD. Respiratory: Respiratory effort normal.  BS equal and clear bilateral without rales, rhonci, wheezing or stridor. Cardio: Heart sounds are normal with regular rate and rhythm and no murmurs, rubs or  gallops. Peripheral pulses are normal and equal bilaterally without edema. No aortic or femoral bruits. Chest: symmetric with normal excursions and percussion. Breasts: Symmetric, without lumps, nipple discharge, retractions, or fibrocystic changes.  Abdomen: Flat, soft with bowel sounds active. Nontender, no guarding, rebound, hernias, masses, or organomegaly.  Lymphatics: Non tender without lymphadenopathy.  Genitourinary:  Musculoskeletal: Full ROM all peripheral extremities, joint stability, 5/5 strength, and normal gait. Skin: Warm and dry without rashes, lesions, cyanosis, clubbing or  ecchymosis.  Neuro: Cranial nerves intact, reflexes equal bilaterally. Normal muscle tone, no cerebellar symptoms. Sensation intact.  Pysch: Alert and oriented X 3, normal affect, Insight and Judgment appropriate.   Assessment and Plan  1. Annual Preventative Screening  Examination   2. Labile hypertension  - EKG 12-Lead - [place], RETROPERITNL ABD,  LTD - Urinalysis, Routine w reflex microscopic - Microalbumin / creatinine urine ratio - CBC with Differential/Platelet - BASIC METABOLIC PANEL WITH GFR - Magnesium - TSH  3. Hyperlipidemia, mixed  - EKG 12-Lead - [place], RETROPERITNL ABD,  LTD - Hepatic function panel - Lipid panel - TSH  4. Prediabetes  - EKG 12-Lead - [place], RETROPERITNL ABD,  LTD - Hemoglobin A1c - Insulin, random  5. Vitamin D deficiency  - VITAMIN D 25 Hydroxy  6. Hypothyroidism  - TSH  7. Gastroesophageal reflux disease   8. Screening for rectal cancer  - POC Hemoccult Bld/Stl   9. Screening for ischemic heart disease  - EKG 12-Lead  10. Screening for AAA (aortic abdominal aneurysm)  - [place], RETROPERITNL ABD,  LTD  11. Screening examination for pulmonary tuberculosis   12. Other fatigue  - Vitamin B12 - Iron and TIBC - CBC with Differential/Platelet  13. Medication management  - Urinalysis, Routine w reflex microscopic - Microalbumin / creatinine urine ratio - CBC with Differential/Platelet - BASIC METABOLIC PANEL WITH GFR - Hepatic function panel - Magnesium - Lipid panel - TSH - Hemoglobin A1c - Insulin, random - VITAMIN D 25 Hydroxy        Patient was counseled in prudent diet to achieve/maintain BMI less than 25 for weight control, BP monitoring, regular exercise and medications. Discussed med's effects and SE's. Screening labs and tests as requested with regular follow-up as recommended. Over 40 minutes of exam, counseling, chart review and high complex critical decision making was performed.

## 2017-07-02 LAB — HEPATIC FUNCTION PANEL
ALT: 9 U/L (ref 6–29)
AST: 15 U/L (ref 10–35)
Albumin: 4.1 g/dL (ref 3.6–5.1)
Alkaline Phosphatase: 35 U/L (ref 33–130)
Bilirubin, Direct: 0.1 mg/dL (ref <=0.2)
Indirect Bilirubin: 0.3 mg/dL (ref 0.2–1.2)
Total Bilirubin: 0.4 mg/dL (ref 0.2–1.2)
Total Protein: 6.7 g/dL (ref 6.1–8.1)

## 2017-07-02 LAB — URINALYSIS, ROUTINE W REFLEX MICROSCOPIC
Bilirubin Urine: NEGATIVE
Glucose, UA: NEGATIVE
Hgb urine dipstick: NEGATIVE
Ketones, ur: NEGATIVE
Leukocytes, UA: NEGATIVE
Nitrite: NEGATIVE
Protein, ur: NEGATIVE
Specific Gravity, Urine: 1.008 (ref 1.001–1.035)
pH: 7.5 (ref 5.0–8.0)

## 2017-07-02 LAB — TSH: TSH: 2.13 mIU/L

## 2017-07-02 LAB — LIPID PANEL
Cholesterol: 176 mg/dL (ref <200)
HDL: 68 mg/dL (ref >50)
LDL Cholesterol: 95 mg/dL (ref <100)
Total CHOL/HDL Ratio: 2.6 Ratio (ref <5.0)
Triglycerides: 65 mg/dL (ref <150)
VLDL: 13 mg/dL (ref <30)

## 2017-07-02 LAB — BASIC METABOLIC PANEL WITH GFR
BUN: 16 mg/dL (ref 7–25)
CO2: 27 mmol/L (ref 20–31)
Calcium: 9.1 mg/dL (ref 8.6–10.4)
Chloride: 104 mmol/L (ref 98–110)
Creat: 0.9 mg/dL (ref 0.50–1.05)
GFR, Est African American: 83 mL/min (ref >=60)
GFR, Est Non African American: 72 mL/min (ref >=60)
Glucose, Bld: 81 mg/dL (ref 65–99)
Potassium: 4.5 mmol/L (ref 3.5–5.3)
Sodium: 142 mmol/L (ref 135–146)

## 2017-07-02 LAB — VITAMIN B12: Vitamin B-12: 799 pg/mL (ref 200–1100)

## 2017-07-02 LAB — IRON AND TIBC
%SAT: 16 % (ref 11–50)
Iron: 47 ug/dL (ref 45–160)
TIBC: 289 ug/dL (ref 250–450)
UIBC: 242 ug/dL

## 2017-07-02 LAB — MICROALBUMIN / CREATININE URINE RATIO
Creatinine, Urine: 49 mg/dL (ref 20–320)
Microalb, Ur: <0.2 mg/dL

## 2017-07-02 LAB — HEMOGLOBIN A1C
Hgb A1c MFr Bld: 5.3 % (ref <5.7)
Mean Plasma Glucose: 105 mg/dL

## 2017-07-02 LAB — VITAMIN D 25 HYDROXY (VIT D DEFICIENCY, FRACTURES): Vit D, 25-Hydroxy: 61 ng/mL (ref 30–100)

## 2017-07-02 LAB — INSULIN, RANDOM: Insulin: 2.2 u[IU]/mL (ref 2.0–19.6)

## 2017-07-02 LAB — MAGNESIUM: Magnesium: 2.2 mg/dL (ref 1.5–2.5)

## 2017-07-04 ENCOUNTER — Encounter: Payer: Self-pay | Admitting: Internal Medicine

## 2017-07-14 ENCOUNTER — Other Ambulatory Visit: Payer: Self-pay | Admitting: *Deleted

## 2017-07-14 DIAGNOSIS — Z1212 Encounter for screening for malignant neoplasm of rectum: Secondary | ICD-10-CM

## 2017-07-14 LAB — POC HEMOCCULT BLD/STL (HOME/3-CARD/SCREEN)
Card #2 Fecal Occult Blod, POC: NEGATIVE
Card #3 Fecal Occult Blood, POC: NEGATIVE
Fecal Occult Blood, POC: NEGATIVE

## 2017-07-14 LAB — TB SKIN TEST
Induration: 0 mm
TB Skin Test: NEGATIVE

## 2017-09-21 ENCOUNTER — Other Ambulatory Visit: Payer: Self-pay | Admitting: Internal Medicine

## 2017-12-02 ENCOUNTER — Other Ambulatory Visit: Payer: Self-pay | Admitting: Physician Assistant

## 2017-12-02 NOTE — Progress Notes (Signed)
Assessment and Plan:  Beth Miranda was seen today for neck pain.  Diagnoses and all orders for this visit:  Neck muscle spasm Continue NSAID, wet heat application, gentle ROM, stretching, massage Discussed may take several weeks to resolve fully May contact back if not getting better - discussed dry needling/medical massage by integrative therapy if needed ER precautions given should she develop weakness of upper extremity or sudden numbness -     cyclobenzaprine (FLEXERIL) 5 MG tablet; Take 1 tablet (5 mg total) by mouth 3 (three) times daily as needed for muscle spasms.  Further disposition pending results of labs. Discussed med's effects and SE's.   Over 15 minutes of exam, counseling, chart review, and critical decision making was performed.   Future Appointments  Date Time Provider [place]  12/03/2017  3:45 PM Liane Comber, NP [name]  01/05/2018  2:30 PM Vicie Mutters, PA-C [name]  05/05/2018  2:30 PM Princess Bruins, MD [facility]  07/26/2018  2:00 PM Unk Pinto, MD [name]    ------------------------------------------------------------------------------------------------------------------   HPI BP 118/76   Pulse 84   Temp 97.7 F (36.5 C)   Ht 5\' 4"  (1.626 m)   Wt 145 lb (65.8 kg)   LMP 03/02/2013   SpO2 97%   BMI 24.89 kg/m   56 y.o.female presents for L sided neck pain ongoing for 14 days or so - brief sharp pains but mainly pulling sensation with rotation of neck 4/10 without radiation; denies trauma, HA, dizziness, changes in vision, parasthesias of upper extremities, weakness. She has tried aleve/heat with some improvement. She denies trauma or instigating event.   Past Medical History:  Diagnosis Date  . Allergy    seasonal  . GERD (gastroesophageal reflux disease)   . Hyperlipidemia   . Labile hypertension   . Prediabetes   . Raynaud's disease   . Thyroid disease    hypothyroid  . Vitamin D deficiency       Allergies  Allergen Reactions  . Itraconazole   . Lamisil [Terbinafine] Hives  . Penicillins Hives  . Sulfa Antibiotics Hives    Current Outpatient Medications on File Prior to Visit  Medication Sig  . aspirin 81 MG tablet Take 81 mg by mouth daily.  . Cetirizine HCl (ZYRTEC ALLERGY PO) Take 10 mg by mouth at bedtime.  . Cholecalciferol (VITAMIN D) 2000 UNITS tablet Take 2,000 Units by mouth daily.   . Cinnamon 500 MG capsule Take 2,000 mg by mouth daily.  . Cyanocobalamin (VITAMIN B 12 PO) Take 1,000 mcg by mouth daily.  Marland Kitchen estradiol (ESTRACE) 2 MG tablet Take 1 tablet (2 mg total) by mouth daily.  Marland Kitchen levothyroxine (SYNTHROID, LEVOTHROID) 50 MCG tablet TAKE ONE TABLET BY MOUTH ONCE DAILY.  . Magnesium 400 MG TABS Take 1 tablet by mouth 2 (two) times daily.  . Omega-3 Fatty Acids (OMEGA-3 FISH OIL) 1200 MG CAPS Take 2 capsules by mouth daily.  . progesterone (PROMETRIUM) 200 MG capsule TAKE 1 CAPSULE BY MOUTH ON THE FIRST 12 DAYS OF THE MONTH.  . ranitidine (ZANTAC) 300 MG tablet TAKE 1 TABLET BY MOUTH DAILY AT BEDTIME AS NEEDED FOR INDIGESTION.  . Red Yeast Rice Extract (RED YEAST RICE PO) Take 2,400 mg by mouth daily.    No current facility-administered medications on file prior to visit.     ROS: all negative except above.   Physical Exam:  LMP 03/02/2013   General Appearance: Well nourished, in no apparent distress. Neck: Supple.  Respiratory: Respiratory  effort normal, BS equal bilaterally without rales, rhonchi, wheezing or stridor.  Cardio: RRR with no MRGs. Brisk peripheral pulses without edema.  Abdomen: Soft, + BS.  Non tender, no guarding, rebound, hernias, masses. Lymphatics: Non tender without lymphadenopathy.  Musculoskeletal: Full ROM, 5/5 strength, normal gait. Muscle tenderness to palpation with some spasming to L paraspinals and trapezius. No point tenderness over spinous processes.  Neuro: Normal muscle tone, no cerebellar symptoms. Sensation intact.   Psych: Awake and oriented X 3, normal affect, Insight and Judgment appropriate.     Izora Ribas, NP 8:48 PM [facility]

## 2017-12-03 ENCOUNTER — Ambulatory Visit: Payer: BLUE CROSS/BLUE SHIELD | Admitting: Adult Health

## 2017-12-03 ENCOUNTER — Encounter: Payer: Self-pay | Admitting: Adult Health

## 2017-12-03 VITALS — BP 118/76 | HR 84 | Temp 97.7°F | Ht 64.0 in | Wt 145.0 lb

## 2017-12-03 DIAGNOSIS — M62838 Other muscle spasm: Secondary | ICD-10-CM | POA: Diagnosis not present

## 2017-12-03 MED ORDER — CYCLOBENZAPRINE HCL 5 MG PO TABS: 5.0000 mg | ORAL_TABLET | Freq: Three times a day (TID) | ORAL | 0 refills | Status: DC | PRN

## 2017-12-03 NOTE — Patient Instructions (Signed)
Neck Exercises Neck exercises can be important for many reasons:  They can help you to improve and maintain flexibility in your neck. This can be especially important as you age.  They can help to make your neck stronger. This can make movement easier.  They can reduce or prevent neck pain.  They may help your upper back.  Ask your health care provider which neck exercises would be best for you. Exercises Neck Press Repeat this exercise 10 times. Do it first thing in the morning and right before bed or as told by your health care provider. 1. Lie on your back on a firm bed or on the floor with a pillow under your head. 2. Use your neck muscles to push your head down on the pillow and straighten your spine. 3. Hold the position as well as you can. Keep your head facing up and your chin tucked. 4. Slowly count to 5 while holding this position. 5. Relax for a few seconds. Then repeat.  Isometric Strengthening Do a full set of these exercises 2 times a day or as told by your health care provider. 1. Sit in a supportive chair and place your hand on your forehead. 2. Push forward with your head and neck while pushing back with your hand. Hold for 10 seconds. 3. Relax. Then repeat the exercise 3 times. 4. Next, do thesequence again, this time putting your hand against the back of your head. Use your head and neck to push backward against the hand pressure. 5. Finally, do the same exercise on either side of your head, pushing sideways against the pressure of your hand.  Prone Head Lifts Repeat this exercise 5 times. Do this 2 times a day or as told by your health care provider. 1. Lie face-down, resting on your elbows so that your chest and upper back are raised. 2. Start with your head facing downward, near your chest. Position your chin either on or near your chest. 3. Slowly lift your head upward. Lift until you are looking straight ahead. Then continue lifting your head as far back as  you can stretch. 4. Hold your head up for 5 seconds. Then slowly lower it to your starting position.  Supine Head Lifts Repeat this exercise 8-10 times. Do this 2 times a day or as told by your health care provider. 1. Lie on your back, bending your knees to point to the ceiling and keeping your feet flat on the floor. 2. Lift your head slowly off the floor, raising your chin toward your chest. 3. Hold for 5 seconds. 4. Relax and repeat.  Scapular Retraction Repeat this exercise 5 times. Do this 2 times a day or as told by your health care provider. 1. Stand with your arms at your sides. Look straight ahead. 2. Slowly pull both shoulders backward and downward until you feel a stretch between your shoulder blades in your upper back. 3. Hold for 10-30 seconds. 4. Relax and repeat.  Contact a health care provider if:  Your neck pain or discomfort gets much worse when you do an exercise.  Your neck pain or discomfort does not improve within 2 hours after you exercise. If you have any of these problems, stop exercising right away. Do not do the exercises again unless your health care provider says that you can. Get help right away if:  You develop sudden, severe neck pain. If this happens, stop exercising right away. Do not do the exercises again unless your   health care provider says that you can. Exercises Neck Stretch  Repeat this exercise 3-5 times. 1. Do this exercise while standing or while sitting in a chair. 2. Place your feet flat on the floor, shoulder-width apart. 3. Slowly turn your head to the right. Turn it all the way to the right so you can look over your right shoulder. Do not tilt or tip your head. 4. Hold this position for 10-30 seconds. 5. Slowly turn your head to the left, to look over your left shoulder. 6. Hold this position for 10-30 seconds.  Neck Retraction Repeat this exercise 8-10 times. Do this 3-4 times a day or as told by your health care  provider. 1. Do this exercise while standing or while sitting in a sturdy chair. 2. Look straight ahead. Do not bend your neck. 3. Use your fingers to push your chin backward. Do not bend your neck for this movement. Continue to face straight ahead. If you are doing the exercise properly, you will feel a slight sensation in your throat and a stretch at the back of your neck. 4. Hold the stretch for 1-2 seconds. Relax and repeat.  This information is not intended to replace advice given to you by your health care provider. Make sure you discuss any questions you have with your health care provider. Document Released: 11/12/2015 Document Revised: 05/08/2016 Document Reviewed: 06/11/2015 Elsevier Interactive Patient Education  2018 [employer]  

## 2017-12-21 ENCOUNTER — Other Ambulatory Visit: Payer: Self-pay | Admitting: Internal Medicine

## 2018-01-05 ENCOUNTER — Ambulatory Visit: Payer: Self-pay | Admitting: Physician Assistant

## 2018-01-26 NOTE — Progress Notes (Signed)
FOLLOW UP  Assessment and Plan:   Hypertension -Continue medication, monitor blood pressure at home. Continue DASH diet.  Reminder to go to the ER if any CP, SOB, nausea, dizziness, severe HA, changes vision/speech, left arm numbness and tingling and jaw pain.  Cholesterol -Continue diet and exercise. Check cholesterol.   Vitamin D Def - check level and continue medications.   Continue diet and meds as discussed. Further disposition pending results of labs. Over 30 minutes of exam, counseling, chart review, and critical decision making was performed  Future Appointments  Date Time Provider [place]  01/28/2018  4:30 PM Vicie Mutters, PA-C [name]  05/05/2018  2:30 PM Princess Bruins, MD [facility]  07/26/2018  2:00 PM Unk Pinto, MD [name]     HPI 57 y.o. female  presents for 3 month follow up on hypertension, cholesterol, prediabetes, and vitamin D deficiency.   Dad is 70 and mom is 67, both living at home, lives 6-7 miles from home, has increased stress.   Her blood pressure has been controlled at home, today their BP is BP: 124/80   She does not workout. She denies chest pain, shortness of breath, dizziness.   She  is not  on cholesterol medication and denies myalgias. Her cholesterol is not at goal. The cholesterol last visit was:   Lab Results  Component Value Date   CHOL 176 07/01/2017   HDL 68 07/01/2017   LDLCALC 95 07/01/2017   TRIG 65 07/01/2017   CHOLHDL 2.6 07/01/2017    Last A1C in the office was:  Lab Results  Component Value Date   HGBA1C 5.3 07/01/2017   She is on thyroid medication. Her medication was not changed last visit.   Lab Results  Component Value Date   TSH 2.13 07/01/2017  .  Patient is on Vitamin D supplement.   Lab Results  Component Value Date   VD25OH 61 07/01/2017     BMI is Body mass index is 24.37 kg/m., she is working on diet and exercise. Wt Readings from Last 3 Encounters:  01/28/18 142  lb (64.4 kg)  12/03/17 145 lb (65.8 kg)  07/01/17 139 lb 3.2 oz (63.1 kg)     Current Medications:  Current Outpatient Medications on File Prior to Visit  Medication Sig  . aspirin 81 MG tablet Take 81 mg by mouth daily.  . Cetirizine HCl (ZYRTEC ALLERGY PO) Take 10 mg by mouth at bedtime.  . Cholecalciferol (VITAMIN D) 2000 UNITS tablet Take 2,000 Units by mouth daily.   . Cinnamon 500 MG capsule Take 2,000 mg by mouth daily.  . Cyanocobalamin (VITAMIN B 12 PO) Take 1,000 mcg by mouth daily.  . cyclobenzaprine (FLEXERIL) 5 MG tablet Take 1 tablet (5 mg total) by mouth 3 (three) times daily as needed for muscle spasms.  Marland Kitchen estradiol (ESTRACE) 2 MG tablet Take 1 tablet (2 mg total) by mouth daily.  Marland Kitchen levothyroxine (SYNTHROID, LEVOTHROID) 50 MCG tablet TAKE ONE TABLET BY MOUTH ONCE DAILY.  . Magnesium 400 MG TABS Take 1 tablet by mouth 2 (two) times daily.  . Omega-3 Fatty Acids (OMEGA-3 FISH OIL) 1200 MG CAPS Take 2 capsules by mouth daily.  . progesterone (PROMETRIUM) 200 MG capsule TAKE 1 CAPSULE BY MOUTH ON THE FIRST 12 DAYS OF THE MONTH.  . Red Yeast Rice Extract (RED YEAST RICE PO) Take 2,400 mg by mouth daily.    No current facility-administered medications on file prior to visit.  Medical History:  Past Medical History:  Diagnosis Date  . Allergy    seasonal  . GERD (gastroesophageal reflux disease)   . Hyperlipidemia   . Labile hypertension   . Prediabetes   . Raynaud's disease   . Thyroid disease    hypothyroid  . Vitamin D deficiency    Allergies:  Allergies  Allergen Reactions  . Itraconazole   . Lamisil [Terbinafine] Hives  . Penicillins Hives  . Sulfa Antibiotics Hives     Review of Systems:  ROS  Family history- Review and unchanged Social history- Review and unchanged Physical Exam: BP 124/80   Pulse 80   Temp (!) 97.3 F (36.3 C)   Resp 16   Ht 5\' 4"  (1.626 m)   Wt 142 lb (64.4 kg)   LMP 03/02/2013   SpO2 93%   BMI 24.37 kg/m  Wt  Readings from Last 3 Encounters:  01/28/18 142 lb (64.4 kg)  12/03/17 145 lb (65.8 kg)  07/01/17 139 lb 3.2 oz (63.1 kg)   General Appearance: Well nourished, in no apparent distress. Eyes: PERRLA, EOMs, conjunctiva no swelling or erythema Sinuses: No Frontal/maxillary tenderness ENT/Mouth: Ext aud canals clear, TMs without erythema, bulging. No erythema, swelling, or exudate on post pharynx.  Tonsils not swollen or erythematous. Hearing normal.  Neck: Supple, thyroid normal.  Respiratory: Respiratory effort normal, BS equal bilaterally without rales, rhonchi, wheezing or stridor.  Cardio: RRR with no MRGs. Brisk peripheral pulses without edema.  Abdomen: Soft, + BS,  Non tender, no guarding, rebound, hernias, masses. Lymphatics: Non tender without lymphadenopathy.  Musculoskeletal: Full ROM, 5/5 strength, Normal gait Skin: Warm, dry without rashes, lesions, ecchymosis.  Neuro: Cranial nerves intact. Normal muscle tone, no cerebellar symptoms. Psych: Awake and oriented X 3, normal affect, Insight and Judgment appropriate.    Vicie Mutters, PA-C 4:26 PM [facility]

## 2018-01-28 ENCOUNTER — Other Ambulatory Visit: Payer: Self-pay

## 2018-01-28 ENCOUNTER — Encounter: Payer: Self-pay | Admitting: Physician Assistant

## 2018-01-28 ENCOUNTER — Ambulatory Visit: Payer: BLUE CROSS/BLUE SHIELD | Admitting: Physician Assistant

## 2018-01-28 VITALS — BP 124/80 | HR 80 | Temp 97.3°F | Resp 16 | Ht 64.0 in | Wt 142.0 lb

## 2018-01-28 DIAGNOSIS — E782 Mixed hyperlipidemia: Secondary | ICD-10-CM | POA: Diagnosis not present

## 2018-01-28 DIAGNOSIS — E039 Hypothyroidism, unspecified: Secondary | ICD-10-CM

## 2018-01-28 DIAGNOSIS — Z79899 Other long term (current) drug therapy: Secondary | ICD-10-CM

## 2018-01-28 DIAGNOSIS — R7303 Prediabetes: Secondary | ICD-10-CM

## 2018-01-28 DIAGNOSIS — R0989 Other specified symptoms and signs involving the circulatory and respiratory systems: Secondary | ICD-10-CM | POA: Diagnosis not present

## 2018-01-28 MED ORDER — RANITIDINE HCL 300 MG PO TABS: ORAL_TABLET | ORAL | 1 refills | Status: DC

## 2018-01-28 NOTE — Patient Instructions (Signed)
Common causes of cough OR hoarseness OR sore throat:   Allergies, Viral Infections, Acid Reflux and Bacterial Infections.   Allergies and viral infections cause a cough OR sore throat by post nasal drip and are often worse at night, can also have sneezing, lower grade fevers, clear/yellow mucus. This is best treated with allergy medications or nasal sprays.  Please get on allegra for 1-2 weeks The strongest is allegra or fexafinadine  Cheapest at [employer], [employer], [employer]   Bacterial infections are more severe than allergies or viral infections with fever, teeth pain, fatigue. This can be treated with prednisone and the same over the counter medication and after 7 days can be treated with an antibiotic.   Silent reflux/GERD can cause a cough OR sore throat OR hoarseness WITHOUT heart burn because the esophagus that goes to the stomach and trachea that goes to the lungs are very close and when you lay down the acid can irritate your throat and lungs. This can cause hoarseness, cough, and wheezing. Please stop any alcohol or anti-inflammatories like aleve/advil/ibuprofen and start an over the counter Prilosec or omeprazole 1-2 times daily 30mins before food for 2 weeks, then switch to over the counter zantac/ratinidine or pepcid/famotadine once at night for 2 weeks.    sometimes irritation causes more irritation. Try voice rest, use sugar free cough drops to prevent coughing, and try to stop clearing your throat.   If you ever have a cough that does not go away after trying these things please make a follow up visit for further evaluation or we can refer you to a specialist. Or if you ever have shortness of breath or chest pain go to the ER.    

## 2018-03-01 ENCOUNTER — Ambulatory Visit: Payer: BLUE CROSS/BLUE SHIELD | Admitting: Physician Assistant

## 2018-03-01 ENCOUNTER — Encounter: Payer: Self-pay | Admitting: Physician Assistant

## 2018-03-01 VITALS — BP 102/70 | HR 80 | Temp 97.9°F | Ht 64.0 in | Wt 146.8 lb

## 2018-03-01 DIAGNOSIS — M545 Low back pain, unspecified: Secondary | ICD-10-CM

## 2018-03-01 MED ORDER — MELOXICAM 15 MG PO TABS: ORAL_TABLET | ORAL | 1 refills | Status: DC

## 2018-03-01 MED ORDER — CYCLOBENZAPRINE HCL 10 MG PO TABS: 10.0000 mg | ORAL_TABLET | Freq: Every evening | ORAL | 0 refills | Status: DC | PRN

## 2018-03-01 NOTE — Progress Notes (Signed)
Subjective:    Patient ID: Beth Miranda, female    DOB: 1961/06/16, 57 y.o.   MRN: [id number]  HPI 57 y.o. WF presents with right back pain x saturday. No history of back issies.  States she picked something up and had "twinge" with it, then she has been having pain since that time. Hurts with lying on it, sitting on right hip, twists to the left, pending forward some pain on the right with radiation upward. Took two aleve yesterday without pain.  Patient denies fever, hematuria, incontinence, numbness, tingling, weakness and saddle anesthesia  Blood pressure 102/70, pulse 80, temperature 97.9 F (36.6 C), height 5\' 4"  (1.626 m), weight 146 lb 12.8 oz (66.6 kg), last menstrual period 03/02/2013, SpO2 95 %.  Medications Current Outpatient Medications on File Prior to Visit  Medication Sig  . aspirin 81 MG tablet Take 81 mg by mouth daily.  . Cetirizine HCl (ZYRTEC ALLERGY PO) Take 10 mg by mouth at bedtime.  . Cholecalciferol (VITAMIN D) 2000 UNITS tablet Take 2,000 Units by mouth daily.   . Cinnamon 500 MG capsule Take 2,000 mg by mouth daily.  . Cyanocobalamin (VITAMIN B 12 PO) Take 1,000 mcg by mouth daily.  . cyclobenzaprine (FLEXERIL) 5 MG tablet Take 1 tablet (5 mg total) by mouth 3 (three) times daily as needed for muscle spasms.  Marland Kitchen estradiol (ESTRACE) 2 MG tablet Take 1 tablet (2 mg total) by mouth daily.  Marland Kitchen levothyroxine (SYNTHROID, LEVOTHROID) 50 MCG tablet TAKE ONE TABLET BY MOUTH ONCE DAILY.  . Magnesium 400 MG TABS Take 1 tablet by mouth 2 (two) times daily.  . Omega-3 Fatty Acids (OMEGA-3 FISH OIL) 1200 MG CAPS Take 2 capsules by mouth daily.  . progesterone (PROMETRIUM) 200 MG capsule TAKE 1 CAPSULE BY MOUTH ON THE FIRST 12 DAYS OF THE MONTH.  . ranitidine (ZANTAC) 300 MG tablet TAKE 1 TABLET BY MOUTH DAILY AT BEDTIME AS NEEDED FOR INDIGESTION.  . Red Yeast Rice Extract (RED YEAST RICE PO) Take 2,400 mg by mouth daily.    No current facility-administered medications on  file prior to visit.     Problem list She has Labile hypertension; GERD (gastroesophageal reflux disease); Raynaud's disease; Hyperlipidemia; Prediabetes; Vitamin D deficiency; Hypothyroidism; Medication management; and MCTD (mixed connective tissue disease), remote  ([place]) on their problem list.   Review of Systems  Constitutional: Negative.   HENT: Negative.   Respiratory: Negative.   Cardiovascular: Negative.   Gastrointestinal: Negative.   Genitourinary: Negative for decreased urine volume, dysuria, enuresis, flank pain, frequency, genital sores, menstrual problem, pelvic pain, urgency, vaginal bleeding, vaginal discharge and vaginal pain.  Musculoskeletal: Positive for back pain. Negative for arthralgias and gait problem.  Skin: Negative.  Negative for rash.  Neurological: Negative.   Psychiatric/Behavioral: Negative.        Objective:   Physical Exam  Constitutional: She is oriented to person, place, and time. She appears well-developed and well-nourished.  HENT:  Head: Normocephalic and atraumatic.  Eyes: Conjunctivae are normal. Pupils are equal, round, and reactive to light.  Neck: Normal range of motion. Neck supple.  Cardiovascular: Normal rate and regular rhythm.  Pulmonary/Chest: Effort normal and breath sounds normal.  Abdominal: Soft. Bowel sounds are normal. There is no tenderness.  Musculoskeletal:  Patient is able to ambulate well. Gait is not  Antalgic. Straight leg raising with dorsiflexion negative bilaterally for radicular symptoms. Sensory exam in the legs are normal. Knee reflexes are normal Ankle reflexes are normal Strength is  normal and symmetric in arms and legs. There is not SI tenderness to palpation.  There is paraspinal muscle spasm on the right paraspinus L4-L5.  There is not midline tenderness.  ROM of spine with  limited in flexion due to pain.   Lymphadenopathy:    She has no cervical adenopathy.  Neurological: She is alert and oriented to person,  place, and time. She has normal reflexes.  Skin: Skin is warm and dry. No rash noted.       Assessment & Plan:  Beth Miranda was seen today for acute visit.  Diagnoses and all orders for this visit:  Acute right-sided low back pain without sciatica No red flag symptoms, likely muscular If not better call the office -     meloxicam (MOBIC) 15 MG tablet; Take one daily with food for 2 weeks, can take with tylenol, can not take with aleve, iburpofen, then as needed daily for pain -     cyclobenzaprine (FLEXERIL) 10 MG tablet; Take 1 tablet (10 mg total) by mouth at bedtime as needed for muscle spasms.  Go to the ER if you have any new weakness in your legs, have trouble controlling your urine or bowels, or have worsening pain.   Future Appointments  Date Time Provider [place]  05/05/2018  2:30 PM Princess Bruins, MD [facility]  07/26/2018  2:00 PM Unk Pinto, MD [name]

## 2018-03-01 NOTE — Patient Instructions (Signed)
Try the exercises and other information in the back care manual, meloxicam once during the day as needed (avoid taking other NSAIDS like Alleve or Ibuprofen while taking this) and then flexeril if needed at bedtime for muscle spasm. This can be taken up to every 8 hours, but causes sedation, so should not drive or operate heavy machinery while taking this medicine.   Go to the ER if you have any new weakness in your legs, have trouble controlling your urine or bowels, or have worsening pain.   If you are not better in 1-3 month we will refer you to ortho   Back pain Rehab Ask your health care provider which exercises are safe for you. Do exercises exactly as told by your health care provider and adjust them as directed. It is normal to feel mild stretching, pulling, tightness, or discomfort as you do these exercises, but you should stop right away if you feel sudden pain or your pain gets worse.Do not begin these exercises until told by your health care provider. Stretching and range of motion exercises These exercises warm up your muscles and joints and improve the movement and flexibility of your hips and your back. These exercises also help to relieve pain, numbness, and tingling. Exercise A: Sciatic nerve glide 1. Sit in a chair with your head facing down toward your chest. Place your hands behind your back. Let your shoulders slump forward. 2. Slowly straighten one of your knees while you tilt your head back as if you are looking toward the ceiling. Only straighten your leg as far as you can without making your symptoms worse. 3. Hold for __________ seconds. 4. Slowly return to the starting position. 5. Repeat with your other leg. Repeat __________ times. Complete this exercise __________ times a day. Exercise B: Knee to chest with hip adduction and internal rotation  1. Lie on your back on a firm surface with both legs straight. 2. Bend one of your knees and move it up toward your chest  until you feel a gentle stretch in your lower back and buttock. Then, move your knee toward the shoulder that is on the opposite side from your leg. ? Hold your leg in this position by holding onto the front of your knee. 3. Hold for __________ seconds. 4. Slowly return to the starting position. 5. Repeat with your other leg. Repeat __________ times. Complete this exercise __________ times a day. Exercise C: Prone extension on elbows  1. Lie on your abdomen on a firm surface. A bed may be too soft for this exercise. 2. Prop yourself up on your elbows. 3. Use your arms to help lift your chest up until you feel a gentle stretch in your abdomen and your lower back. ? This will place some of your body weight on your elbows. If this is uncomfortable, try stacking pillows under your chest. ? Your hips should stay down, against the surface that you are lying on. Keep your hip and back muscles relaxed. 4. Hold for __________ seconds. 5. Slowly relax your upper body and return to the starting position. Repeat __________ times. Complete this exercise __________ times a day. Strengthening exercises These exercises build strength and endurance in your back. Endurance is the ability to use your muscles for a long time, even after they get tired. Exercise D: Pelvic tilt 1. Lie on your back on a firm surface. Bend your knees and keep your feet flat. 2. Tense your abdominal muscles. Tip your pelvis up  toward the ceiling and flatten your lower back into the floor. ? To help with this exercise, you may place a small towel under your lower back and try to push your back into the towel. 3. Hold for __________ seconds. 4. Let your muscles relax completely before you repeat this exercise. Repeat __________ times. Complete this exercise __________ times a day. Exercise E: Alternating arm and leg raises  1. Get on your hands and knees on a firm surface. If you are on a hard floor, you may want to use padding to  cushion your knees, such as an exercise mat. 2. Line up your arms and legs. Your hands should be below your shoulders, and your knees should be below your hips. 3. Lift your left leg behind you. At the same time, raise your right arm and straighten it in front of you. ? Do not lift your leg higher than your hip. ? Do not lift your arm higher than your shoulder. ? Keep your abdominal and back muscles tight. ? Keep your hips facing the ground. ? Do not arch your back. ? Keep your balance carefully, and do not hold your breath. 4. Hold for __________ seconds. 5. Slowly return to the starting position and repeat with your right leg and your left arm. Repeat __________ times. Complete this exercise __________ times a day. Posture and body mechanics  Body mechanics refers to the movements and positions of your body while you do your daily activities. Posture is part of body mechanics. Good posture and healthy body mechanics can help to relieve stress in your body's tissues and joints. Good posture means that your spine is in its natural S-curve position (your spine is neutral), your shoulders are pulled back slightly, and your head is not tipped forward. The following are general guidelines for applying improved posture and body mechanics to your everyday activities. Standing   When standing, keep your spine neutral and your feet about hip-width apart. Keep a slight bend in your knees. Your ears, shoulders, and hips should line up.  When you do a task in which you stand in one place for a long time, place one foot up on a stable object that is 2-4 inches (5-10 cm) high, such as a footstool. This helps keep your spine neutral. Sitting   When sitting, keep your spine neutral and keep your feet flat on the floor. Use a footrest, if necessary, and keep your thighs parallel to the floor. Avoid rounding your shoulders, and avoid tilting your head forward.  When working at a desk or a computer, keep  your desk at a height where your hands are slightly lower than your elbows. Slide your chair under your desk so you are close enough to maintain good posture.  When working at a computer, place your monitor at a height where you are looking straight ahead and you do not have to tilt your head forward or downward to look at the screen. Resting   When lying down and resting, avoid positions that are most painful for you.  If you have pain with activities such as sitting, bending, stooping, or squatting (flexion-based activities), lie in a position in which your body does not bend very much. For example, avoid curling up on your side with your arms and knees near your chest (fetal position).  If you have pain with activities such as standing for a long time or reaching with your arms (extension-based activities), lie with your spine in a neutral  position and bend your knees slightly. Try the following positions: ? Lying on your side with a pillow between your knees. ? Lying on your back with a pillow under your knees. Lifting   When lifting objects, keep your feet at least shoulder-width apart and tighten your abdominal muscles.  Bend your knees and hips and keep your spine neutral. It is important to lift using the strength of your legs, not your back. Do not lock your knees straight out.  Always ask for help to lift heavy or awkward objects. This information is not intended to replace advice given to you by your health care provider. Make sure you discuss any questions you have with your health care provider. Document Released: 12/01/2005 Document Revised: 08/07/2016 Document Reviewed: 08/17/2015 Elsevier Interactive Patient Education  [employer].

## 2018-03-23 ENCOUNTER — Other Ambulatory Visit: Payer: Self-pay | Admitting: Adult Health

## 2018-04-20 ENCOUNTER — Other Ambulatory Visit: Payer: Self-pay | Admitting: Internal Medicine

## 2018-04-20 DIAGNOSIS — Z1231 Encounter for screening mammogram for malignant neoplasm of breast: Secondary | ICD-10-CM

## 2018-05-05 ENCOUNTER — Ambulatory Visit (INDEPENDENT_AMBULATORY_CARE_PROVIDER_SITE_OTHER): Payer: BLUE CROSS/BLUE SHIELD | Admitting: Obstetrics & Gynecology

## 2018-05-05 ENCOUNTER — Encounter: Payer: Self-pay | Admitting: Obstetrics & Gynecology

## 2018-05-05 VITALS — BP 122/80 | Ht 64.0 in | Wt 143.0 lb

## 2018-05-05 DIAGNOSIS — Z01419 Encounter for gynecological examination (general) (routine) without abnormal findings: Secondary | ICD-10-CM | POA: Diagnosis not present

## 2018-05-05 DIAGNOSIS — M8589 Other specified disorders of bone density and structure, multiple sites: Secondary | ICD-10-CM

## 2018-05-05 DIAGNOSIS — Z79890 Hormone replacement therapy (postmenopausal): Secondary | ICD-10-CM | POA: Diagnosis not present

## 2018-05-05 MED ORDER — PROGESTERONE MICRONIZED 100 MG PO CAPS: 100.0000 mg | ORAL_CAPSULE | Freq: Every day | ORAL | 4 refills | Status: DC

## 2018-05-05 MED ORDER — ESTRADIOL 1 MG PO TABS: 1.0000 mg | ORAL_TABLET | Freq: Every day | ORAL | 4 refills | Status: DC

## 2018-05-05 NOTE — Progress Notes (Signed)
Beth Miranda [birth year] [id number]   History:    57 y.o. G0 Married  RP:  Established patient presenting for annual gyn exam   HPI: Menopause on HRT x 6 years.  Well on hormone replacement therapy.  Had severe vasomotor menopausal symptoms now controlled on estradiol 2 mg daily and cyclic Prometrium 12 days a month.  No postmenopausal bleeding.  No pelvic pain.  Abstinent.  Previous Paps normal.  Urine and bowel movements normal.  Breasts normal.  Body mass index 24.55.  Fit and has a good nutrition.  Health labs with family physician.  Past medical history,surgical history, family history and social history were all reviewed and documented in the EPIC chart.  Gynecologic History Patient's last menstrual period was 03/02/2013. Contraception: post menopausal status Last Pap: 04/2015. Results were: Negative/HPV HR neg Last mammogram: 05/2017. Results were: Negative Bone Density: 05/2017 T-Score -1.6 Colonoscopy: 05/2012  Obstetric History OB History  Gravida Para Term Preterm AB Living  0 0 0 0 0 0  SAB TAB Ectopic Multiple Live Births  0 0 0 0       ROS: A ROS was performed and pertinent positives and negatives are included in the history.  GENERAL: No fevers or chills. HEENT: No change in vision, no earache, sore throat or sinus congestion. NECK: No pain or stiffness. CARDIOVASCULAR: No chest pain or pressure. No palpitations. PULMONARY: No shortness of breath, cough or wheeze. GASTROINTESTINAL: No abdominal pain, nausea, vomiting or diarrhea, melena or bright red blood per rectum. GENITOURINARY: No urinary frequency, urgency, hesitancy or dysuria. MUSCULOSKELETAL: No joint or muscle pain, no back pain, no recent trauma. DERMATOLOGIC: No rash, no itching, no lesions. ENDOCRINE: No polyuria, polydipsia, no heat or cold intolerance. No recent change in weight. HEMATOLOGICAL: No anemia or easy bruising or bleeding. NEUROLOGIC: No headache, seizures, numbness, tingling or weakness.  PSYCHIATRIC: No depression, no loss of interest in normal activity or change in sleep pattern.     Exam:   BP 122/80 (BP Location: Right Arm, Patient Position: Sitting, Cuff Size: Normal)   Ht 5\' 4"  (1.626 m)   Wt 143 lb (64.9 kg)   LMP 03/02/2013   BMI 24.55 kg/m   Body mass index is 24.55 kg/m.  General appearance : Well developed well nourished female. No acute distress HEENT: Eyes: no retinal hemorrhage or exudates,  Neck supple, trachea midline, no carotid bruits, no thyroidmegaly Lungs: Clear to auscultation, no rhonchi or wheezes, or rib retractions  Heart: Regular rate and rhythm, no murmurs or gallops Breast:Examined in sitting and supine position were symmetrical in appearance, no palpable masses or tenderness,  no skin retraction, no nipple inversion, no nipple discharge, no skin discoloration, no axillary or supraclavicular lymphadenopathy Abdomen: no palpable masses or tenderness, no rebound or guarding Extremities: no edema or skin discoloration or tenderness  Pelvic: Vulva: Normal             Vagina: No gross lesions or discharge  Cervix: No gross lesions or discharge  Uterus  AV, normal size, shape and consistency, non-tender and mobile  Adnexa  Without masses or tenderness  Anus: Normal   Assessment/Plan:  57 y.o. female for annual exam   1. Well female exam with routine gynecological exam Normal gynecologic exam.  Pap test negative with negative high-risk HPV in May 2016.  Patient is abstinent.  Decision to repeat Pap test at 5 years.  Breasts normal.  Will repeat screening mammogram in June 2019.  Colonoscopy 2013.  Health  labs with family physician.  2. Post-menopause on HRT (hormone replacement therapy) Well on hormone replacement therapy.  No contraindication to continue.  Will decrease the estradiol to 1 mg tablet daily at this point.  Prometrium 100 mg will be changed to a daily bedtime dose.  Usage, risks and benefits reviewed with patient.  3.  Osteopenia of multiple sites Osteopenia at many sites on last bone density June 2018.  Patient will continue with hormone replacement therapy, vitamin D supplements, calcium rich nutrition and regular weightbearing physical activity.  Will repeat bone density in June 2020.  Other orders - estradiol (ESTRACE) 1 MG tablet; Take 1 tablet (1 mg total) by mouth daily. - progesterone (PROMETRIUM) 100 MG capsule; Take 1 capsule (100 mg total) by mouth at bedtime.  Princess Bruins MD, 2:41 PM 05/05/2018

## 2018-05-08 ENCOUNTER — Encounter: Payer: Self-pay | Admitting: Obstetrics & Gynecology

## 2018-05-08 NOTE — Patient Instructions (Signed)
1. Well female exam with routine gynecological exam Normal gynecologic exam.  Pap test negative with negative high-risk HPV in May 2016.  Patient is abstinent.  Decision to repeat Pap test at 5 years.  Breasts normal.  Will repeat screening mammogram in June 2019.  Colonoscopy 2013.  Health labs with family physician.  2. Post-menopause on HRT (hormone replacement therapy) Well on hormone replacement therapy.  No contraindication to continue.  Will decrease the estradiol to 1 mg tablet daily at this point.  Prometrium 100 mg will be changed to a daily bedtime dose.  Usage, risks and benefits reviewed with patient.  3. Osteopenia of multiple sites Osteopenia at many sites on last bone density June 2018.  Patient will continue with hormone replacement therapy, vitamin D supplements, calcium rich nutrition and regular weightbearing physical activity.  Will repeat bone density in June 2020.  Other orders - estradiol (ESTRACE) 1 MG tablet; Take 1 tablet (1 mg total) by mouth daily. - progesterone (PROMETRIUM) 100 MG capsule; Take 1 capsule (100 mg total) by mouth at bedtime.  Beth Miranda, it was a pleasure meeting you today!

## 2018-06-04 ENCOUNTER — Ambulatory Visit
Admission: RE | Admit: 2018-06-04 | Discharge: 2018-06-04 | Disposition: A | Discharge status: Home / Self Care | Payer: BLUE CROSS/BLUE SHIELD | Source: Ambulatory Visit | Attending: Internal Medicine | Admitting: Internal Medicine

## 2018-06-04 DIAGNOSIS — Z1231 Encounter for screening mammogram for malignant neoplasm of breast: Secondary | ICD-10-CM

## 2018-06-28 ENCOUNTER — Other Ambulatory Visit: Payer: Self-pay | Admitting: Adult Health

## 2018-07-19 ENCOUNTER — Other Ambulatory Visit: Payer: Self-pay | Admitting: Physician Assistant

## 2018-07-26 ENCOUNTER — Encounter: Payer: Self-pay | Admitting: Internal Medicine

## 2018-07-26 ENCOUNTER — Ambulatory Visit (INDEPENDENT_AMBULATORY_CARE_PROVIDER_SITE_OTHER): Payer: BLUE CROSS/BLUE SHIELD | Admitting: Internal Medicine

## 2018-07-26 VITALS — BP 122/84 | HR 72 | Temp 97.3°F | Resp 16 | Ht 64.0 in | Wt 143.0 lb

## 2018-07-26 DIAGNOSIS — Z8249 Family history of ischemic heart disease and other diseases of the circulatory system: Secondary | ICD-10-CM

## 2018-07-26 DIAGNOSIS — Z79899 Other long term (current) drug therapy: Secondary | ICD-10-CM

## 2018-07-26 DIAGNOSIS — E039 Hypothyroidism, unspecified: Secondary | ICD-10-CM

## 2018-07-26 DIAGNOSIS — Z136 Encounter for screening for cardiovascular disorders: Secondary | ICD-10-CM

## 2018-07-26 DIAGNOSIS — E559 Vitamin D deficiency, unspecified: Secondary | ICD-10-CM

## 2018-07-26 DIAGNOSIS — K219 Gastro-esophageal reflux disease without esophagitis: Secondary | ICD-10-CM

## 2018-07-26 DIAGNOSIS — R5383 Other fatigue: Secondary | ICD-10-CM | POA: Diagnosis not present

## 2018-07-26 DIAGNOSIS — Z1212 Encounter for screening for malignant neoplasm of rectum: Secondary | ICD-10-CM

## 2018-07-26 DIAGNOSIS — E782 Mixed hyperlipidemia: Secondary | ICD-10-CM

## 2018-07-26 DIAGNOSIS — R0989 Other specified symptoms and signs involving the circulatory and respiratory systems: Secondary | ICD-10-CM

## 2018-07-26 DIAGNOSIS — R7303 Prediabetes: Secondary | ICD-10-CM | POA: Diagnosis not present

## 2018-07-26 DIAGNOSIS — Z1211 Encounter for screening for malignant neoplasm of colon: Secondary | ICD-10-CM

## 2018-07-26 DIAGNOSIS — Z0001 Encounter for general adult medical examination with abnormal findings: Secondary | ICD-10-CM

## 2018-07-26 DIAGNOSIS — Z111 Encounter for screening for respiratory tuberculosis: Secondary | ICD-10-CM

## 2018-07-26 MED ORDER — GABAPENTIN 600 MG PO TABS: ORAL_TABLET | ORAL | 1 refills | Status: DC

## 2018-07-26 MED ORDER — DESOXIMETASONE 0.25 % EX CREA: 1.0000 "application " | TOPICAL_CREAM | Freq: Two times a day (BID) | CUTANEOUS | 3 refills | Status: DC

## 2018-07-26 NOTE — Patient Instructions (Signed)

## 2018-07-26 NOTE — Progress Notes (Signed)
[facility] Unk Pinto, M.D.     Uvaldo Bristle. Silverio Lay, P.A.-C Liane Comber, [place] [address] [place] [address] Telephone [phone] Telefax [phone] Annual Screening/Preventative Visit & Comprehensive Evaluation &  Examination     This very nice 57 y.o. MWF  presents for a Screening /Preventative Visit & comprehensive evaluation and management of multiple medical co-morbidities.  Patient has been followed for HTN, HLD, Prediabetes  and Vitamin D Deficiency.  Patient has GERD controlled with her diet & meds.       Patient also has hx/o sero(+) Mixed Connective Disease Syndrome predating back to the 1980's & w/associated Raynauds' syndrome which has been in remission for many years.      Labile HTN predates since 2000. Patient's BP has been controlled at home and patient denies any cardiac symptoms as chest pain, palpitations, shortness of breath, dizziness or ankle swelling. Today's BP is at goal -  122/84.     Patient's hyperlipidemia is controlled with diet and supplements. Last lipids were at goal: Lab Results  Component Value Date   CHOL 176 07/01/2017   HDL 68 07/01/2017   LDLCALC 95 07/01/2017   TRIG 65 07/01/2017   CHOLHDL 2.6 07/01/2017      Patient is also on Thyroid replacement since 2010.     Patient has hx/o prediabetes  (A1c 6.0%/2012 and 5.9%/2016) and patient denies reactive hypoglycemic symptoms, visual blurring, diabetic polys, or paresthesias. Last A1c was at goal: Lab Results  Component Value Date   HGBA1C 5.3 07/01/2017      Finally, patient has history of Vitamin D Deficiency and last Vitamin D was at goal: Lab Results  Component Value Date   VD25OH 61 07/01/2017   Current Outpatient Medications on File Prior to Visit  Medication Sig  . aspirin 81 MG tablet Take 81 mg by mouth daily.  . Cetirizine HCl (ZYRTEC ALLERGY PO) Take 10 mg by mouth at bedtime.  .  Cholecalciferol (VITAMIN D) 2000 UNITS tablet Take 2,000 Units by mouth daily.   . Cinnamon 500 MG capsule Take 2,000 mg by mouth daily.  . Cyanocobalamin (VITAMIN B 12 PO) Take 1,000 mcg by mouth daily.  Marland Kitchen estradiol (ESTRACE) 1 MG tablet Take 1 tablet (1 mg total) by mouth daily.  Marland Kitchen levothyroxine (SYNTHROID, LEVOTHROID) 50 MCG tablet TAKE ONE TABLET BY MOUTH ONCE DAILY.  . Magnesium 400 MG TABS Take 1 tablet by mouth 2 (two) times daily.  . Omega-3 Fatty Acids (OMEGA-3 FISH OIL) 1200 MG CAPS Take 2 capsules by mouth daily.  . progesterone (PROMETRIUM) 100 MG capsule Take 1 capsule (100 mg total) by mouth at bedtime.  . ranitidine (ZANTAC) 300 MG tablet TAKE 1 TABLET BY MOUTH DAILY AT BEDTIME AS NEEDED FOR INDIGESTION.  . Red Yeast Rice Extract (RED YEAST RICE PO) Take 2,400 mg by mouth daily.    No current facility-administered medications on file prior to visit.    Allergies  Allergen Reactions  . Itraconazole   . Lamisil [Terbinafine] Hives  . Penicillins Hives  . Sulfa Antibiotics Hives   Past Medical History:  Diagnosis Date  . Allergy    seasonal  . GERD (gastroesophageal reflux disease)   . Hyperlipidemia   . Labile hypertension   . Prediabetes   . Raynaud's disease   . Thyroid disease    hypothyroid  . Vitamin D deficiency    Health Maintenance  Topic Date Due  . PAP SMEAR  05/01/2018  . INFLUENZA VACCINE  07/15/2018  . MAMMOGRAM  06/05/2019  . COLONOSCOPY  05/28/2022  . TETANUS/TDAP  07/02/2027  . Hepatitis C Screening  Completed  . HIV Screening  Completed   Immunization History  Administered Date(s) Administered  . Hepatitis B 06/06/2002, 07/07/2002, 12/29/2002  . PPD Test 05/09/2014, 05/10/2015, 06/04/2016, 07/01/2017, 07/26/2018  . Pneumococcal-Unspecified 12/15/1992  . Td 01/06/2007  . Tdap 07/01/2017  . Zoster Recombinat (Shingrix) 03/01/2018, 05/21/2018   Last Colon - 05/28/2012 - Dr Deatra Ina recc 10 yr f/u - due June 2023.  Last MGM - 06/07/2018 - Dr  Dellis Filbert  Past Surgical History:  Procedure Laterality Date  . TONSILLECTOMY     Family History  Problem Relation Age of Onset  . Pancreatic cancer Paternal Grandmother   . Arthritis Mother        Living  . COPD Mother   . Asthma Mother   . Hypertension Father        Living  . Diabetes Father   . Kidney disease Father        Stones  . Hyperlipidemia Father   . Other Brother        Deceased  . Breast cancer Neg Hx    Social History   Tobacco Use  . Smoking status: Never Smoker  . Smokeless tobacco: Never Used  Substance Use Topics  . Alcohol use: Yes    Alcohol/week: 1.0 standard drinks    Types: 1 Standard drinks or equivalent per week    Comment: 2 glasses  per week  . Drug use: No    ROS Constitutional: Denies fever, chills, weight loss/gain, headaches, insomnia,  night sweats, and change in appetite. Does c/o fatigue. Eyes: Denies redness, blurred vision, diplopia, discharge, itchy, watery eyes.  ENT: Denies discharge, congestion, post nasal drip, epistaxis, sore throat, earache, hearing loss, dental pain, Tinnitus, Vertigo, Sinus pain, snoring.  Cardio: Denies chest pain, palpitations, irregular heartbeat, syncope, dyspnea, diaphoresis, orthopnea, PND, claudication, edema Respiratory: denies cough, dyspnea, DOE, pleurisy, hoarseness, laryngitis, wheezing.  Gastrointestinal: Denies dysphagia, heartburn, reflux, water brash, pain, cramps, nausea, vomiting, bloating, diarrhea, constipation, hematemesis, melena, hematochezia, jaundice, hemorrhoids Genitourinary: Denies dysuria, frequency, urgency, nocturia, hesitancy, discharge, hematuria, flank pain Breast: Breast lumps, nipple discharge, bleeding.  Musculoskeletal: Denies arthralgia, myalgia, stiffness, Jt. Swelling, pain, limp, and strain/sprain. Denies falls. Skin: Denies puritis, rash, hives, warts, acne, eczema, changing in skin lesion Neuro: No weakness, tremor, incoordination, spasms, paresthesia,  pain Psychiatric: Denies confusion, memory loss, sensory loss. Denies Depression. Endocrine: Denies change in weight, skin, hair change, nocturia, and paresthesia, diabetic polys, visual blurring, hyper / hypo glycemic episodes.  Heme/Lymph: No excessive bleeding, bruising, enlarged lymph nodes.  Physical Exam  BP 122/84   Pulse 72   Temp (!) 97.3 F (36.3 C)   Resp 16   Ht 5\' 4"  (1.626 m)   Wt 143 lb (64.9 kg)   LMP 03/02/2013   BMI 24.55 kg/m   General Appearance: Well nourished, well groomed and in no apparent distress.  Eyes: PERRLA, EOMs, conjunctiva no swelling or erythema, normal fundi and vessels. Sinuses: No frontal/maxillary tenderness ENT/Mouth: EACs patent / TMs  nl. Nares clear without erythema, swelling, mucoid exudates. Oral hygiene is good. No erythema, swelling, or exudate. Tongue normal, non-obstructing. Tonsils not swollen or erythematous. Hearing normal.  Neck: Supple, thyroid not palpable. No bruits, nodes or JVD. Respiratory: Respiratory effort normal.  BS equal and clear bilateral without rales, rhonci, wheezing or stridor. Cardio: Heart sounds are normal with regular  rate and rhythm and no murmurs, rubs or gallops. Peripheral pulses are normal and equal bilaterally without edema. No aortic or femoral bruits. Chest: symmetric with normal excursions and percussion. Breasts: deferred to Dr Dellis Filbert Abdomen: Flat, soft with bowel sounds active. Nontender, no guarding, rebound, hernias, masses, or organomegaly.  Lymphatics: Non tender without lymphadenopathy.  Genitourinary: Deferred to Dr Dellis Filbert Musculoskeletal: Full ROM all peripheral extremities, joint stability, 5/5 strength, and normal gait. Skin: Warm and dry without rashes, lesions, cyanosis, clubbing or  ecchymosis.  Neuro: Cranial nerves intact, reflexes equal bilaterally. Normal muscle tone, no cerebellar symptoms. Sensation intact.  Pysch: Alert and oriented X 3, normal affect, Insight and Judgment  appropriate.   Assessment and Plan  1. Annual Preventative Screening Examination  2. Labile hypertension  - EKG 12-Lead - [place], RETROPERITNL ABD,  LTD - Urinalysis, Routine w reflex microscopic - Microalbumin / creatinine urine ratio - CBC with Differential/Platelet - COMPLETE METABOLIC PANEL WITH GFR - Magnesium - TSH  3. Hyperlipidemia, mixed  - EKG 12-Lead - [place], RETROPERITNL ABD,  LTD - Lipid panel - TSH  4. Prediabetes  - EKG 12-Lead - [place], RETROPERITNL ABD,  LTD - Hemoglobin A1c - Insulin, random  5. Vitamin D deficiency  - VITAMIN D 25 Hydroxyl  6. Hypothyroidism  - TSH  7. Screening for colorectal cancer  - POC Hemoccult Bld/Stl   8. Gastroesophageal reflux disease  - CBC with Differential/Platelet  9. Screening for ischemic heart disease  - EKG 12-Lead  10. FH: hypertension  - EKG 12-Lead - [place], RETROPERITNL ABD,  LTD  11. Screening for AAA (aortic abdominal aneurysm)  - [place], RETROPERITNL ABD,  LTD  12. Screening examination for pulmonary tuberculosis  - PPD  13. Fatigue, unspecified type  - Iron,Total/Total Iron Binding Cap - Vitamin B12  14. Medication management  - Urinalysis, Routine w reflex microscopic - Microalbumin / creatinine urine ratio - CBC with Differential/Platelet - COMPLETE METABOLIC PANEL WITH GFR - Magnesium - Lipid panel - TSH - Hemoglobin A1c - Insulin, random - VITAMIN D 25 Hydroxyl      Patient was counseled in prudent diet to achieve/maintain BMI less than 25 for weight control, BP monitoring, regular exercise and medications. Discussed med's effects and SE's. Screening labs and tests as requested with regular follow-up as recommended. Over 40 minutes of exam, counseling, chart review and high complex critical decision making was performed.

## 2018-07-27 ENCOUNTER — Encounter: Payer: Self-pay | Admitting: Internal Medicine

## 2018-07-27 LAB — URINALYSIS, ROUTINE W REFLEX MICROSCOPIC
Bacteria, UA: NONE SEEN /HPF
Bilirubin Urine: NEGATIVE
Glucose, UA: NEGATIVE
Hgb urine dipstick: NEGATIVE
Hyaline Cast: NONE SEEN /LPF
Nitrite: NEGATIVE
Protein, ur: NEGATIVE
RBC / HPF: NONE SEEN /HPF (ref 0–2)
Specific Gravity, Urine: 1.011 (ref 1.001–1.03)
Squamous Epithelial / LPF: NONE SEEN /HPF (ref <=5)
WBC, UA: NONE SEEN /HPF (ref 0–5)
pH: 6.5 (ref 5.0–8.0)

## 2018-07-27 LAB — COMPLETE METABOLIC PANEL WITH GFR
AG Ratio: 1.5 (calc) (ref 1.0–2.5)
ALT: 10 U/L (ref 6–29)
AST: 17 U/L (ref 10–35)
Albumin: 4.6 g/dL (ref 3.6–5.1)
Alkaline phosphatase (APISO): 45 U/L (ref 33–130)
BUN: 17 mg/dL (ref 7–25)
CO2: 30 mmol/L (ref 20–32)
Calcium: 9.8 mg/dL (ref 8.6–10.4)
Chloride: 104 mmol/L (ref 98–110)
Creat: 0.86 mg/dL (ref 0.50–1.05)
GFR, Est African American: 88 mL/min/{1.73_m2} (ref >=60)
GFR, Est Non African American: 76 mL/min/{1.73_m2} (ref >=60)
Globulin: 3.1 g/dL (calc) (ref 1.9–3.7)
Glucose, Bld: 90 mg/dL (ref 65–99)
Potassium: 5.3 mmol/L (ref 3.5–5.3)
Sodium: 142 mmol/L (ref 135–146)
Total Bilirubin: 0.6 mg/dL (ref 0.2–1.2)
Total Protein: 7.7 g/dL (ref 6.1–8.1)

## 2018-07-27 LAB — VITAMIN D 25 HYDROXY (VIT D DEFICIENCY, FRACTURES): Vit D, 25-Hydroxy: 57 ng/mL (ref 30–100)

## 2018-07-27 LAB — IRON, TOTAL/TOTAL IRON BINDING CAP
%SAT: 27 % (calc) (ref 16–45)
Iron: 84 ug/dL (ref 45–160)
TIBC: 308 mcg/dL (calc) (ref 250–450)

## 2018-07-27 LAB — TSH: TSH: 1.82 mIU/L (ref 0.40–4.50)

## 2018-07-27 LAB — CBC WITH DIFFERENTIAL/PLATELET
Basophils Absolute: 32 cells/uL (ref 0–200)
Basophils Relative: 0.7 %
Eosinophils Absolute: 83 cells/uL (ref 15–500)
Eosinophils Relative: 1.8 %
HCT: 42.9 % (ref 35.0–45.0)
Hemoglobin: 14.1 g/dL (ref 11.7–15.5)
Lymphs Abs: 1375 cells/uL (ref 850–3900)
MCH: 29.2 pg (ref 27.0–33.0)
MCHC: 32.9 g/dL (ref 32.0–36.0)
MCV: 88.8 fL (ref 80.0–100.0)
MPV: 10.1 fL (ref 7.5–12.5)
Monocytes Relative: 9.7 %
Neutro Abs: 2663 cells/uL (ref 1500–7800)
Neutrophils Relative %: 57.9 %
Platelets: 247 10*3/uL (ref 140–400)
RBC: 4.83 10*6/uL (ref 3.80–5.10)
RDW: 12.1 % (ref 11.0–15.0)
Total Lymphocyte: 29.9 %
WBC mixed population: 446 cells/uL (ref 200–950)
WBC: 4.6 10*3/uL (ref 3.8–10.8)

## 2018-07-27 LAB — LIPID PANEL
Cholesterol: 211 mg/dL — ABNORMAL HIGH (ref <200)
HDL: 75 mg/dL (ref >50)
LDL Cholesterol (Calc): 120 mg/dL (calc) — ABNORMAL HIGH
Non-HDL Cholesterol (Calc): 136 mg/dL (calc) — ABNORMAL HIGH (ref <130)
Total CHOL/HDL Ratio: 2.8 (calc) (ref <5.0)
Triglycerides: 66 mg/dL (ref <150)

## 2018-07-27 LAB — HEMOGLOBIN A1C
Hgb A1c MFr Bld: 5.5 % of total Hgb (ref <5.7)
Mean Plasma Glucose: 111 (calc)
eAG (mmol/L): 6.2 (calc)

## 2018-07-27 LAB — MICROALBUMIN / CREATININE URINE RATIO
Creatinine, Urine: 34 mg/dL (ref 20–275)
Microalb Creat Ratio: 6 mcg/mg creat (ref <30)
Microalb, Ur: 0.2 mg/dL

## 2018-07-27 LAB — VITAMIN B12: Vitamin B-12: 801 pg/mL (ref 200–1100)

## 2018-07-27 LAB — INSULIN, RANDOM: Insulin: 2.8 u[IU]/mL (ref 2.0–19.6)

## 2018-07-27 LAB — MAGNESIUM: Magnesium: 2.3 mg/dL (ref 1.5–2.5)

## 2018-07-29 LAB — TB SKIN TEST
Induration: 0 mm
TB Skin Test: NEGATIVE

## 2018-08-18 ENCOUNTER — Encounter: Payer: Self-pay | Admitting: Internal Medicine

## 2018-08-18 ENCOUNTER — Ambulatory Visit: Payer: BLUE CROSS/BLUE SHIELD | Admitting: Internal Medicine

## 2018-08-18 VITALS — BP 116/78 | HR 76 | Temp 97.5°F | Resp 16 | Ht 64.0 in | Wt 146.6 lb

## 2018-08-18 DIAGNOSIS — S20211A Contusion of right front wall of thorax, initial encounter: Secondary | ICD-10-CM

## 2018-08-18 MED ORDER — TRAMADOL HCL 50 MG PO TABS: ORAL_TABLET | ORAL | 0 refills | Status: DC

## 2018-08-18 MED ORDER — PREDNISONE 20 MG PO TABS
ORAL_TABLET | ORAL | 0 refills | Status: DC
Start: 2018-08-18 — End: 2018-09-15

## 2018-08-18 NOTE — Patient Instructions (Signed)
Contusion A contusion is a deep bruise. Contusions are the result of a blunt injury to tissues and muscle fibers under the skin. The injury causes bleeding under the skin. The skin overlying the contusion may turn blue, purple, or yellow. Minor injuries will give you a painless contusion, but more severe contusions may stay painful and swollen for a few weeks. What are the causes? This condition is usually caused by a blow, trauma, or direct force to an area of the body. What are the signs or symptoms? Symptoms of this condition include:  Swelling of the injured area.  Pain and tenderness in the injured area.  Discoloration. The area may have redness and then turn blue, purple, or yellow.  How is this diagnosed? This condition is diagnosed based on a physical exam and medical history. An X-ray, CT scan, or MRI may be needed to determine if there are any associated injuries, such as broken bones (fractures). How is this treated? Specific treatment for this condition depends on what area of the body was injured. In general, the best treatment for a contusion is resting, icing, applying pressure to (compression), and elevating the injured area. This is often called the RICE strategy. Over-the-counter anti-inflammatory medicines may also be recommended for pain control. Follow these instructions at home:  Rest the injured area.  If directed, apply ice to the injured area: ? Put ice in a plastic bag. ? Place a towel between your skin and the bag. ? Leave the ice on for 20 minutes, 2-3 times per day.  If directed, apply light compression to the injured area using an elastic bandage. Make sure the bandage is not wrapped too tightly. Remove and reapply the bandage as directed by your health care provider.  If possible, raise (elevate) the injured area above the level of your heart while you are sitting or lying down.  Take over-the-counter and prescription medicines only as told by your health  care provider. Contact a health care provider if:  Your symptoms do not improve after several days of treatment.  Your symptoms get worse.  You have difficulty moving the injured area. Get help right away if:  You have severe pain.  You have numbness in a hand or foot.  Your hand or foot turns pale or cold. This information is not intended to replace advice given to you by your health care provider. Make sure you discuss any questions you have with your health care provider. Document Released: 09/10/2005 Document Revised: 04/10/2016 Document Reviewed: 04/18/2015 Elsevier Interactive Patient Education  2018 [employer]  

## 2018-08-18 NOTE — Progress Notes (Signed)
Subjective:    Patient ID: Beth Miranda, female    DOB: 1961/08/28, 57 y.o.   MRN: [id number]  HPI   This very nice 57 yo MWF presents w/ hx/o fall  Standing on a chair at work 1 week ago (08/11/2018) striking her Rt anterolateral chest. She experienced no immediate discomfort and has had no bruising , but over the last 2-3 days she has developed soreness in the Rt lateral chest. Denies cough , congestion or dyspnea.   Medication Sig  . aspirin 81 MG tablet Take 81 mg by mouth daily.  . Cetirizine HCl (ZYRTEC ALLERGY PO) Take 10 mg by mouth at bedtime.  . Cholecalciferol (VITAMIN D) 2000 UNITS tablet Take 2,000 Units by mouth daily.   . Cinnamon 500 MG capsule Take 2,000 mg by mouth daily.  . Cyanocobalamin (VITAMIN B 12 PO) Take 1,000 mcg by mouth daily.  Marland Kitchen desoximetasone (TOPICORT) 0.25 % cream Apply 1 application topically 2 (two) times daily.  Marland Kitchen estradiol (ESTRACE) 1 MG tablet Take 1 tablet (1 mg total) by mouth daily.  Marland Kitchen gabapentin (NEURONTIN) 600 MG tablet Take 1/2 to 1 tablet 2 to 3 x /day as needed for heart flashes (Patient taking differently: Take 1/2 to 1 tablet 2 to 3 times daily as needed for hot flashes.)  . levothyroxine (SYNTHROID, LEVOTHROID) 50 MCG tablet TAKE ONE TABLET BY MOUTH ONCE DAILY.  . Magnesium 400 MG TABS Take 1 tablet by mouth 2 (two) times daily.  . Omega-3 Fatty Acids (OMEGA-3 FISH OIL) 1200 MG CAPS Take 2 capsules by mouth daily.  . progesterone (PROMETRIUM) 100 MG capsule Take 1 capsule (100 mg total) by mouth at bedtime.  . ranitidine (ZANTAC) 300 MG tablet TAKE 1 TABLET BY MOUTH DAILY AT BEDTIME AS NEEDED FOR INDIGESTION.  . Red Yeast Rice Extract (RED YEAST RICE PO) Take 2,400 mg by mouth daily.    Allergies  Allergen Reactions  . Itraconazole   . Lamisil [Terbinafine] Hives  . Penicillins Hives  . Sulfa Antibiotics Hives   Past Medical History:  Diagnosis Date  . Allergy    seasonal  . GERD (gastroesophageal reflux disease)   . Hyperlipidemia    . Labile hypertension   . Prediabetes   . Raynaud's disease   . Thyroid disease    hypothyroid  . Vitamin D deficiency    Review of Systems   10 point systems review negative except as above.    Objective:   Physical Exam  BP 116/78   Pulse 76   Temp (!) 97.5 F (36.4 C)   Resp 16   Ht 5\' 4"  (1.626 m)   Wt 146 lb 9.6 oz (66.5 kg)   LMP 03/02/2013   BMI 25.16 kg/m   In no distress. No stridor.   HEENT - WNL. Neck - supple.  Chest - Clear equal BS. (+) tender posterolateral Rt chest wall.  Cor - Nl HS. RRR w/o sig m. MS- FROM w/o deformities.  Gait Nl. Neuro -  Nl w/o focal abnormalities. Skin - No bruising.     Assessment & Plan:   1. Contusion of right chest wall, initial encounter  - predniSONE 20 MG tablet; 1 tab 3 x day for 3 days, then 1 tab 2 x day for 3 days, then 1 tab 1 x day for 5 days  Disp: 20 tablet  - traMADol 50 MG tablet; Take 1/2 to 1 tablet every 4 hr as needed for Pain  Disp: 30 tablet

## 2018-08-31 ENCOUNTER — Other Ambulatory Visit: Payer: Self-pay

## 2018-08-31 DIAGNOSIS — Z1211 Encounter for screening for malignant neoplasm of colon: Secondary | ICD-10-CM

## 2018-08-31 DIAGNOSIS — Z1212 Encounter for screening for malignant neoplasm of rectum: Principal | ICD-10-CM

## 2018-08-31 LAB — POC HEMOCCULT BLD/STL (HOME/3-CARD/SCREEN)
Card #2 Fecal Occult Blod, POC: NEGATIVE
Card #3 Fecal Occult Blood, POC: NEGATIVE
Fecal Occult Blood, POC: NEGATIVE

## 2018-09-14 ENCOUNTER — Other Ambulatory Visit: Payer: Self-pay | Admitting: Internal Medicine

## 2018-09-15 ENCOUNTER — Other Ambulatory Visit: Payer: Self-pay | Admitting: Internal Medicine

## 2018-09-15 MED ORDER — FAMOTIDINE 40 MG PO TABS: ORAL_TABLET | ORAL | 3 refills | Status: DC

## 2018-12-10 ENCOUNTER — Ambulatory Visit: Payer: BLUE CROSS/BLUE SHIELD | Admitting: Adult Health Nurse Practitioner

## 2018-12-10 ENCOUNTER — Encounter: Payer: Self-pay | Admitting: Adult Health Nurse Practitioner

## 2018-12-10 VITALS — BP 110/68 | HR 83 | Temp 98.1°F | Ht 64.0 in | Wt 151.0 lb

## 2018-12-10 DIAGNOSIS — J0100 Acute maxillary sinusitis, unspecified: Secondary | ICD-10-CM | POA: Diagnosis not present

## 2018-12-10 DIAGNOSIS — R05 Cough: Secondary | ICD-10-CM | POA: Diagnosis not present

## 2018-12-10 DIAGNOSIS — R059 Cough, unspecified: Secondary | ICD-10-CM

## 2018-12-10 DIAGNOSIS — K219 Gastro-esophageal reflux disease without esophagitis: Secondary | ICD-10-CM | POA: Diagnosis not present

## 2018-12-10 MED ORDER — PANTOPRAZOLE SODIUM 40 MG PO TBEC: 40.0000 mg | DELAYED_RELEASE_TABLET | Freq: Every day | ORAL | 3 refills | Status: DC

## 2018-12-10 MED ORDER — PROMETHAZINE-CODEINE 6.25-10 MG/5ML PO SYRP: 5.0000 mL | ORAL_SOLUTION | Freq: Four times a day (QID) | ORAL | 0 refills | Status: DC | PRN

## 2018-12-10 MED ORDER — PREDNISONE 10 MG (21) PO TBPK: ORAL_TABLET | Freq: Every day | ORAL | 0 refills | Status: DC

## 2018-12-10 MED ORDER — AZITHROMYCIN 250 MG PO TABS: ORAL_TABLET | ORAL | 0 refills | Status: AC

## 2018-12-10 NOTE — Progress Notes (Signed)
Assessment and Plan:  Beth Miranda was seen today for acute visit.  Diagnoses and all orders for this visit:  Acute non-recurrent maxillary sinusitis -     azithromycin (ZITHROMAX Z-PAK) 250 MG tablet; Take 2 tablets (500 mg) on  Day 1,  followed by 1 tablet (250 mg) once daily on Days 2 through 5. -     predniSONE (STERAPRED UNI-PAK 21 TAB) 10 MG (21) TBPK tablet; Take by mouth daily. Follow directions on Taper pack -Discussed changing Zyrtec to Allegra or Xyzal. -Discussed use of Neti pot BID.  Warm bottled or distilled water, DO NOT USE TAP WATER. -Saline Nasal spray PRN  Gastroesophageal reflux disease, esophagitis presence not specified -     pantoprazole (PROTONIX) 40 MG tablet; Take 1 tablet (40 mg total) by mouth daily.  Cough -     promethazine-codeine (PHENERGAN WITH CODEINE) 6.25-10 MG/5ML syrup; Take 5 mLs by mouth every 6 (six) hours as needed for cough.   Call or return with new or worsening symptoms as discussed in appointment.  May contact via office phone [phone] or [place].   Further disposition pending results of labs. Discussed med's effects and SE's.   Over 30 minutes of exam, counseling, chart review, and critical decision making was performed.   Future Appointments  Date Time Provider [place]  01/31/2019  4:30 PM Liane Comber, NP [name]  05/10/2019  3:00 PM Princess Bruins, MD [facility]  08/15/2019  2:00 PM Unk Pinto, MD [name]    ------------------------------------------------------------------------------------------------------------------   HPI 57 y.o.female presents for nasal congestion with clear drainage with dry hacing cough and headache. This has been going on for two weeks.  Her sinus symptoms are bothering her the most.  She has facial soreness as well.  She has been treated for this in the past for sinus infections but not in 2019.  She reports a scratchy throat.  She is taking zyrtec daily. Was recently  switch from zantac to famotidine.  Reports she is having breakthrough reflux at night that is waking her up.    Past Medical History:  Diagnosis Date  . Allergy    seasonal  . GERD (gastroesophageal reflux disease)   . Hyperlipidemia   . Labile hypertension   . Prediabetes   . Raynaud's disease   . Thyroid disease    hypothyroid  . Vitamin D deficiency      Allergies  Allergen Reactions  . Itraconazole   . Lamisil [Terbinafine] Hives  . Penicillins Hives  . Sulfa Antibiotics Hives    Current Outpatient Medications on File Prior to Visit  Medication Sig  . aspirin 81 MG tablet Take 81 mg by mouth daily.  . Cetirizine HCl (ZYRTEC ALLERGY PO) Take 10 mg by mouth at bedtime.  . Cholecalciferol (VITAMIN D) 2000 UNITS tablet Take 2,000 Units by mouth daily.   . Cinnamon 500 MG capsule Take 2,000 mg by mouth daily.  . Cyanocobalamin (VITAMIN B 12 PO) Take 1,000 mcg by mouth daily.  Marland Kitchen desoximetasone (TOPICORT) 0.25 % cream Apply 1 application topically 2 (two) times daily.  Marland Kitchen estradiol (ESTRACE) 1 MG tablet Take 1 tablet (1 mg total) by mouth daily.  . famotidine (PEPCID) 40 MG tablet Take 1 tablet at bedtime for Acid Reflux  . gabapentin (NEURONTIN) 600 MG tablet Take 1/2 to 1 tablet 2 to 3 x /day as needed for heart flashes (Patient taking differently: Take 1/2 to 1 tablet 2 to 3 times daily as needed for hot flashes.)  .  levothyroxine (SYNTHROID, LEVOTHROID) 50 MCG tablet TAKE ONE TABLET BY MOUTH ONCE DAILY.  . Magnesium 400 MG TABS Take 1 tablet by mouth 2 (two) times daily.  . Omega-3 Fatty Acids (OMEGA-3 FISH OIL) 1200 MG CAPS Take 2 capsules by mouth daily.  . progesterone (PROMETRIUM) 100 MG capsule Take 1 capsule (100 mg total) by mouth at bedtime.  . Red Yeast Rice Extract (RED YEAST RICE PO) Take 2,400 mg by mouth daily.   . traMADol (ULTRAM) 50 MG tablet Take 1/2 to 1 tablet every 4 hr as needed for Pain   No current facility-administered medications on file prior to  visit.     ROS: Review of Systems  HENT: Positive for sinus pain and tinnitus. Negative for ear discharge, ear pain, hearing loss, nosebleeds and sore throat.   Respiratory: Negative for stridor.   Gastrointestinal: Negative for abdominal pain, blood in stool, constipation, diarrhea, heartburn, melena, nausea and vomiting.    Physical Exam:  BP 110/68   Pulse 83   Temp 98.1 F (36.7 C)   Ht 5\' 4"  (1.626 m)   Wt 151 lb (68.5 kg)   LMP 03/02/2013   SpO2 98%   BMI 25.92 kg/m   General Appearance: Well nourished, in no apparent distress. Eyes: PERRLA, EOMs, conjunctiva no swelling or erythema. Sinuses: Positive for Frontal and maxillary tenderness. ENT/Mouth: Ext aud canals clear, TMs without erythema, bulging. Serous noted behind right TM. No erythema, swelling, or exudate on post pharynx.  Tonsils not swollen or erythematous. Hearing normal.  Neck: Supple, thyroid normal.  Respiratory: Respiratory effort normal, BS equal bilaterally without rales, rhonchi, wheezing or stridor.  Cardio: RRR with no MRGs. Brisk peripheral pulses without edema.  Lymphatics: Non tender without lymphadenopathy.  Skin: Warm, dry without rashes, lesions, ecchymosis.  Psych: Awake and oriented X 3, normal affect, Insight and Judgment appropriate.     Garnet Sierras, NP 12:37 AM [facility]

## 2018-12-10 NOTE — Patient Instructions (Addendum)
Xyzal or Allegra use one of these in place of your Zyrtec to see if this will reduce your allergy symptoms.    We will send in Protonix for you to help reduce acid.  Take this daily.  Neti Pot: Use twice a day Use bottled or Distilled warm water  DO NOT USE TAP WATER! This will help to sooth your sinuses.  We will send in Azithromyacin antibiotic and prednisone taper, complete these medication.

## 2018-12-13 ENCOUNTER — Encounter: Payer: Self-pay | Admitting: Adult Health Nurse Practitioner

## 2019-01-28 NOTE — Progress Notes (Signed)
FOLLOW UP  Assessment and Plan:   Hypertension At goal at this time off of medications Monitor blood pressure at home; patient to call if consistently greater than 130/80 Continue DASH diet.   Reminder to go to the ER if any CP, SOB, nausea, dizziness, severe HA, changes vision/speech, left arm numbness and tingling and jaw pain.  Cholesterol Currently with mild elevations not requiring statin treatment; on RYRS, omega 3 supplements goal maintain LDL <130 Continue low cholesterol diet and exercise.  Check lipid panel.   Abnormal glucose Recent A1Cs at goal Discussed diet/exercise, weight management  Defer A1C; check CMP  Hypothyroidism continue medications the same pending lab results reminded to take on an empty stomach 30-24mins before food.  check TSH level  BMI 25 Continue to recommend diet heavy in fruits and veggies and low in animal meats, cheeses, and dairy products, appropriate calorie intake Discuss exercise recommendations routinely Continue to monitor weight at each visit  Vitamin D Def Near goal at last visit; continue supplementation to maintain goal of 60-100 Defer Vit D level  GERD Well managed on current medications; didn't tolerate taper from PPI to H2i Discussed diet, avoiding triggers and other lifestyle changes  Acute nasopharyngitis Discussed the importance of avoiding unnecessary antibiotic therapy. Typically 5-7 day self-limited course; call back if persisting longer with progressive symptoms Suggested symptomatic OTC remedies. Nasal saline spray for congestion. Nasal steroids, rotate allergy pill, oral steroids offered Follow up as needed.  Situational depression R/t mom's health Declines medications Lifestyle discussed: diet/exerise, sleep hygiene, stress management, hydration   Continue diet and meds as discussed. Further disposition pending results of labs. Discussed med's effects and SE's.   Over 30 minutes of exam, counseling,  chart review, and critical decision making was performed.   Future Appointments  Date Time Provider [place]  05/10/2019  3:00 PM Princess Bruins, MD [facility]  08/15/2019  2:00 PM Unk Pinto, MD [name]    ----------------------------------------------------------------------------------------------------------------------  HPI 58 y.o. female  presents for 3 month follow up on hypertension, cholesterol, glucose management, hypothyroid, GERD, weight and vitamin D deficiency. Patient also has hx/o sero(+) Mixed Connective Disease Syndrome predating back to the 1980's & w/associated Raynauds' syndrome which has been in remission for many years.  Today she presents with persistent nasal congestion, running nose, sneezing, dry cough, watery eyes. Denies fever/chills. She does endorse facial pressure/sinus headaches. Symptoms ongoing for the past 4-5 days.   She has been taking zyrtec daily, hasn't rotated allergy pill as directed, not on any other meds.   She is tearful today regarding difficult health situation with mom's progressive Parkinson's and recent admission; declines medications to assist with management at this time.   she has a diagnosis of GERD which is currently managed by protonix 40 mg, didn't tolerate trial to taper off of famotidine.  she reports symptoms is currently well controlled, and denies breakthrough reflux, burning in chest, hoarseness or cough.    BMI is Body mass index is 25.58 kg/m., she has not been working on diet and exercise. Wt Readings from Last 3 Encounters:  01/31/19 149 lb (67.6 kg)  12/10/18 151 lb (68.5 kg)  08/18/18 146 lb 9.6 oz (66.5 kg)    Today their BP is BP: 112/68  She does not workout. She denies chest pain, shortness of breath, dizziness.   She is on cholesterol medication omega 3 and Red Yeast Rice Supplement and denies myalgias. Her cholesterol is not at goal. The cholesterol last visit was:  Lab Results   Component Value Date   CHOL 211 (H) 07/26/2018   HDL 75 07/26/2018   LDLCALC 120 (H) 07/26/2018   TRIG 66 07/26/2018   CHOLHDL 2.8 07/26/2018    She has not been working on diet and exercise for glucose management (hx of prediabetes), and denies increased appetite, nausea, paresthesia of the feet, polydipsia, polyuria and visual disturbances. Last A1C in the office was:  Lab Results  Component Value Date   HGBA1C 5.5 07/26/2018   She is on thyroid medication. Her medication was not changed last visit.   Lab Results  Component Value Date   TSH 1.82 07/26/2018   Patient is on Vitamin D supplement.   Lab Results  Component Value Date   VD25OH 57 07/26/2018        Current Medications:  Current Outpatient Medications on File Prior to Visit  Medication Sig  . aspirin 81 MG tablet Take 81 mg by mouth daily.  . Cetirizine HCl (ZYRTEC ALLERGY PO) Take 10 mg by mouth at bedtime.  . Cholecalciferol (VITAMIN D) 2000 UNITS tablet Take 2,000 Units by mouth daily.   . Cinnamon 500 MG capsule Take 2,000 mg by mouth daily.  . Cyanocobalamin (VITAMIN B 12 PO) Take 1,000 mcg by mouth daily.  Marland Kitchen desoximetasone (TOPICORT) 0.25 % cream Apply 1 application topically 2 (two) times daily.  Marland Kitchen estradiol (ESTRACE) 1 MG tablet Take 1 tablet (1 mg total) by mouth daily.  Marland Kitchen gabapentin (NEURONTIN) 600 MG tablet Take 1/2 to 1 tablet 2 to 3 x /day as needed for heart flashes (Patient taking differently: Takes 1-2 capsule at bedtime)  . levothyroxine (SYNTHROID, LEVOTHROID) 50 MCG tablet TAKE ONE TABLET BY MOUTH ONCE DAILY.  . Magnesium 400 MG TABS Take 1 tablet by mouth 2 (two) times daily.  . Omega-3 Fatty Acids (OMEGA-3 FISH OIL) 1200 MG CAPS Take 2 capsules by mouth daily.  . pantoprazole (PROTONIX) 40 MG tablet Take 1 tablet (40 mg total) by mouth daily.  . progesterone (PROMETRIUM) 100 MG capsule Take 1 capsule (100 mg total) by mouth at bedtime.  . Red Yeast Rice Extract (RED YEAST RICE PO) Take 2,400  mg by mouth daily.    No current facility-administered medications on file prior to visit.      Allergies:  Allergies  Allergen Reactions  . Itraconazole   . Lamisil [Terbinafine] Hives  . Penicillins Hives  . Sulfa Antibiotics Hives     Medical History:  Past Medical History:  Diagnosis Date  . Allergy    seasonal  . GERD (gastroesophageal reflux disease)   . Hyperlipidemia   . Labile hypertension   . Prediabetes   . Raynaud's disease   . Thyroid disease    hypothyroid  . Vitamin D deficiency    Family history- Reviewed and unchanged Social history- Reviewed and unchanged   Review of Systems:  Review of Systems  Constitutional: Negative for malaise/fatigue and weight loss.  HENT: Negative for hearing loss and tinnitus.   Eyes: Negative for blurred vision and double vision.  Respiratory: Negative for cough, shortness of breath and wheezing.   Cardiovascular: Negative for chest pain, palpitations, orthopnea, claudication and leg swelling.  Gastrointestinal: Negative for abdominal pain, blood in stool, constipation, diarrhea, heartburn, melena, nausea and vomiting.  Genitourinary: Negative.   Musculoskeletal: Negative for joint pain and myalgias.  Skin: Negative for rash.  Neurological: Negative for dizziness, tingling, sensory change, weakness and headaches.  Endo/Heme/Allergies: Negative for polydipsia.  Psychiatric/Behavioral: Positive for  depression. Negative for substance abuse and suicidal ideas. The patient is not nervous/anxious and does not have insomnia.   All other systems reviewed and are negative.   Physical Exam: BP 112/68   Pulse 74   Temp (!) 97.5 F (36.4 C)   Ht 5\' 4"  (1.626 m)   Wt 149 lb (67.6 kg)   LMP 03/02/2013   SpO2 98%   BMI 25.58 kg/m  Wt Readings from Last 3 Encounters:  01/31/19 149 lb (67.6 kg)  12/10/18 151 lb (68.5 kg)  08/18/18 146 lb 9.6 oz (66.5 kg)   General Appearance: Well nourished, in no apparent distress. Eyes:  PERRLA, EOMs, conjunctiva no swelling or erythema Sinuses: No Frontal/maxillary tenderness ENT/Mouth: Ext aud canals clear, TMs without erythema, bulging. No erythema, swelling, or exudate on post pharynx.  Tonsils not swollen or erythematous. Hearing normal.  Neck: Supple, thyroid normal.  Respiratory: Respiratory effort normal, BS equal bilaterally without rales, rhonchi, wheezing or stridor.  Cardio: RRR with no MRGs. Brisk peripheral pulses without edema.  Abdomen: Soft, + BS.  Non tender, no guarding, rebound, hernias, masses. Lymphatics: Non tender without lymphadenopathy.  Musculoskeletal: Full ROM, 5/5 strength, Normal gait Skin: Warm, dry without rashes, lesions, ecchymosis.  Neuro: Cranial nerves intact. No cerebellar symptoms.  Psych: Awake and oriented X 3, tearful affect, Insight and Judgment appropriate.    Izora Ribas, NP 4:50 PM [facility]

## 2019-01-31 ENCOUNTER — Ambulatory Visit: Payer: BLUE CROSS/BLUE SHIELD | Admitting: Adult Health

## 2019-01-31 ENCOUNTER — Encounter: Payer: Self-pay | Admitting: Adult Health

## 2019-01-31 VITALS — BP 112/68 | HR 74 | Temp 97.5°F | Ht 64.0 in | Wt 149.0 lb

## 2019-01-31 DIAGNOSIS — E039 Hypothyroidism, unspecified: Secondary | ICD-10-CM | POA: Diagnosis not present

## 2019-01-31 DIAGNOSIS — K219 Gastro-esophageal reflux disease without esophagitis: Secondary | ICD-10-CM | POA: Diagnosis not present

## 2019-01-31 DIAGNOSIS — E782 Mixed hyperlipidemia: Secondary | ICD-10-CM | POA: Diagnosis not present

## 2019-01-31 DIAGNOSIS — R0989 Other specified symptoms and signs involving the circulatory and respiratory systems: Secondary | ICD-10-CM

## 2019-01-31 DIAGNOSIS — E559 Vitamin D deficiency, unspecified: Secondary | ICD-10-CM | POA: Diagnosis not present

## 2019-01-31 DIAGNOSIS — M351 Other overlap syndromes: Secondary | ICD-10-CM

## 2019-01-31 DIAGNOSIS — Z6825 Body mass index (BMI) 25.0-25.9, adult: Secondary | ICD-10-CM

## 2019-01-31 DIAGNOSIS — R7309 Other abnormal glucose: Secondary | ICD-10-CM

## 2019-01-31 NOTE — Patient Instructions (Addendum)
Goals    . LDL CALC < 130       HOW TO TREAT VIRAL COUGH AND COLD SYMPTOMS:  -Symptoms usually last at least 1 week with the worst symptoms being around day 4.  - colds usually start with a sore throat and end with a cough, and the cough can take 2 weeks to get better.  -No antibiotics are needed for colds, flu, sore throats, cough, bronchitis UNLESS symptoms are longer than 7 days OR if you are getting better then get drastically worse.  -There are a lot of combination medications (Dayquil, Nyquil, Vicks 44, tyelnol cold and sinus, ETC). Please look at the ingredients on the back so that you are treating the correct symptoms and not doubling up on medications/ingredients.    Medicines you can use  Nasal congestion  Little Remedies saline spray (aerosol/mist)- can try this, it is in the kids section - pseudoephedrine (Sudafed)- behind the counter, do not use if you have high blood pressure, medicine that have -D in them.  - phenylephrine (Sudafed PE) -Dextormethorphan + chlorpheniramine (Coridcidin HBP)- okay if you have high blood pressure -Oxymetazoline (Afrin) nasal spray- LIMIT to 3 days -Saline nasal spray -Neti pot (used distilled or bottled water)  Ear pain/congestion  -pseudoephedrine (sudafed) - Nasonex/flonase nasal spray  Fever  -Acetaminophen (Tyelnol) -Ibuprofen (Advil, motrin, aleve)  Sore Throat  -Acetaminophen (Tyelnol) -Ibuprofen (Advil, motrin, aleve) -Drink a lot of water -Gargle with salt water - Rest your voice (don't talk) -Throat sprays -Cough drops  Body Aches  -Acetaminophen (Tyelnol) -Ibuprofen (Advil, motrin, aleve)  Headache  -Acetaminophen (Tyelnol) -Ibuprofen (Advil, motrin, aleve) - Exedrin, Exedrin Migraine  Allergy symptoms (cough, sneeze, runny nose, itchy eyes) -Claritin or loratadine cheapest but likely the weakest  -Zyrtec or certizine at night because it can make you sleepy -The strongest is allegra or fexafinadine  Cheapest  at [employer], [employer], [employer]  Cough  -Dextromethorphan (Delsym)- medicine that has DM in it -Guafenesin (Mucinex/Robitussin) - cough drops - drink lots of water  Chest Congestion  -Guafenesin (Mucinex/Robitussin)  Red Itchy Eyes  - Naphcon-A  Upset Stomach  - Bland diet (nothing spicy, greasy, fried, and high acid foods like tomatoes, oranges, berries) -OKAY- cereal, bread, soup, crackers, rice -Eat smaller more frequent meals -reduce caffeine, no alcohol -Loperamide (Imodium-AD) if diarrhea -Prevacid for heart burn  General health when sick  -Hydration -wash your hands frequently -keep surfaces clean -change pillow cases and sheets often -Get fresh air but do not exercise strenuously -Vitamin D, double up on it - Vitamin C -Zinc     Palliative Care Palliative care involves care of body, mind, and spirit in order to improve a person's quality of life. Palliative care services are offered to people dealing with serious and life-threatening illnesses, often in the hospital or in a long-term care setting. The specific services are different for each person and are based on the person's needs and preferences. Palliative care requires a team of people who ensure:  Control of pain and other symptoms.  Family support.  Spiritual support.  Emotional and social support.  Comfort. Palliative care is a way to bring comfort and peace of mind to a person and his or her family. It can have a positive impact on the person's quality of life and the course of the illness. What is the difference between palliative care and hospice? Palliative care and hospice have similar goals of managing symptoms, promoting comfort, improving quality of life, and maintaining a person's dignity.  However, palliative care may be offered during any phase of a serious illness, while hospice care is usually offered when a person is expected to live for 6 months or less. Who can receive palliative care  services? Palliative care is offered to children and adults who are seriously ill. It is often offered in cases where a person:  Is not responding well to treatment.  Needs pain management.  Had treatment or surgery and developed symptoms that are difficult to manage.  Is undergoing a treatment to cure a condition (active treatment), like chemotherapy.  Has a diagnosis of an advanced disease, or a disease that shortens his or her life. A health care provider will usually recommend palliative care services when more support would be helpful. Family members and friends may also receive palliative care services to cope with stress and other concerns. Who makes up the palliative care team? The following people make up a palliative care team:  The person receiving care and his or her family.  Physicians, including primary health care providers and specialists.  Nurses.  A Education officer, museum. Depending on a person's needs, the following people may also be included on a palliative care team:  A pain specialist.  A hospice specialist.  A financial or Research officer, trade union.  Religious or spiritual leaders.  A care coordinator or case manager.  A bereavement coordinator. The team will talk with the person and his or her family about:  The role of the pain specialist and the hospice specialist.  The person's physical symptoms, such as pain, nausea, vomiting, and shortness of breath.  Stress, depression, and anxiety symptoms.  Function and mobility issues and how to stay as active as possible.  Treatment options and how they may affect life.  Spiritual wishes, such as rituals and prayer.  Legacy and memory making activities.  Life and death as a normal process.  Advance directives or living wills, health care proxies, and end-of-life care.  Any other concerns or issues. The palliative care team will make it okay to talk about difficult issues and topics. They will address  spiritual and emotional concerns and give the person's preferences high importance. Summary  Palliative care involves care of body, mind, and spirit in order to improve a person's quality of life.  The specific services are different for each person and are based on the person's needs and preferences.  Palliative care is a way to bring comfort and peace of mind to a person and his or her family. This information is not intended to replace advice given to you by your health care provider. Make sure you discuss any questions you have with your health care provider. Document Released: 12/06/2013 Document Revised: 02/16/2017 Document Reviewed: 02/16/2017 Elsevier Interactive Patient Education  2019 [employer].

## 2019-02-01 LAB — MAGNESIUM: Magnesium: 2.2 mg/dL (ref 1.5–2.5)

## 2019-02-01 LAB — COMPLETE METABOLIC PANEL WITH GFR
AG Ratio: 1.6 (calc) (ref 1.0–2.5)
ALT: 10 U/L (ref 6–29)
AST: 16 U/L (ref 10–35)
Albumin: 4.7 g/dL (ref 3.6–5.1)
Alkaline phosphatase (APISO): 45 U/L (ref 37–153)
BUN: 14 mg/dL (ref 7–25)
CO2: 33 mmol/L — ABNORMAL HIGH (ref 20–32)
Calcium: 9.7 mg/dL (ref 8.6–10.4)
Chloride: 105 mmol/L (ref 98–110)
Creat: 0.82 mg/dL (ref 0.50–1.05)
GFR, Est African American: 92 mL/min/{1.73_m2} (ref >=60)
GFR, Est Non African American: 79 mL/min/{1.73_m2} (ref >=60)
Globulin: 2.9 g/dL (calc) (ref 1.9–3.7)
Glucose, Bld: 92 mg/dL (ref 65–99)
Potassium: 4.1 mmol/L (ref 3.5–5.3)
Sodium: 144 mmol/L (ref 135–146)
Total Bilirubin: 0.5 mg/dL (ref 0.2–1.2)
Total Protein: 7.6 g/dL (ref 6.1–8.1)

## 2019-02-01 LAB — CBC WITH DIFFERENTIAL/PLATELET
Absolute Monocytes: 539 cells/uL (ref 200–950)
Basophils Absolute: 21 cells/uL (ref 0–200)
Basophils Relative: 0.6 %
Eosinophils Absolute: 32 cells/uL (ref 15–500)
Eosinophils Relative: 0.9 %
HCT: 42 % (ref 35.0–45.0)
Hemoglobin: 14.2 g/dL (ref 11.7–15.5)
Lymphs Abs: 1012 cells/uL (ref 850–3900)
MCH: 29.5 pg (ref 27.0–33.0)
MCHC: 33.8 g/dL (ref 32.0–36.0)
MCV: 87.1 fL (ref 80.0–100.0)
MPV: 10.3 fL (ref 7.5–12.5)
Monocytes Relative: 15.4 %
Neutro Abs: 1897 cells/uL (ref 1500–7800)
Neutrophils Relative %: 54.2 %
Platelets: 232 10*3/uL (ref 140–400)
RBC: 4.82 10*6/uL (ref 3.80–5.10)
RDW: 12.2 % (ref 11.0–15.0)
Total Lymphocyte: 28.9 %
WBC: 3.5 10*3/uL — ABNORMAL LOW (ref 3.8–10.8)

## 2019-02-01 LAB — LIPID PANEL
Cholesterol: 221 mg/dL — ABNORMAL HIGH (ref <200)
HDL: 74 mg/dL (ref >=50)
LDL Cholesterol (Calc): 131 mg/dL (calc) — ABNORMAL HIGH
Non-HDL Cholesterol (Calc): 147 mg/dL (calc) — ABNORMAL HIGH (ref <130)
Total CHOL/HDL Ratio: 3 (calc) (ref <5.0)
Triglycerides: 67 mg/dL (ref <150)

## 2019-02-01 LAB — TSH: TSH: 1.84 mIU/L (ref 0.40–4.50)

## 2019-02-22 ENCOUNTER — Ambulatory Visit: Payer: BLUE CROSS/BLUE SHIELD | Admitting: Podiatry

## 2019-02-22 ENCOUNTER — Encounter: Payer: Self-pay | Admitting: Podiatry

## 2019-02-22 ENCOUNTER — Ambulatory Visit (INDEPENDENT_AMBULATORY_CARE_PROVIDER_SITE_OTHER): Payer: BLUE CROSS/BLUE SHIELD

## 2019-02-22 DIAGNOSIS — M898X7 Other specified disorders of bone, ankle and foot: Secondary | ICD-10-CM | POA: Diagnosis not present

## 2019-02-22 DIAGNOSIS — L603 Nail dystrophy: Secondary | ICD-10-CM | POA: Diagnosis not present

## 2019-02-22 DIAGNOSIS — N951 Menopausal and female climacteric states: Secondary | ICD-10-CM | POA: Insufficient documentation

## 2019-02-22 DIAGNOSIS — M359 Systemic involvement of connective tissue, unspecified: Secondary | ICD-10-CM | POA: Insufficient documentation

## 2019-02-22 NOTE — Progress Notes (Signed)
She presents today after having not seen her in over 2 years with a chief complaint of a painful hallux nail left.  She states that is thick and yellow she is tried ciclopirox for the past 7 to 8 months and it really has not helped.  She states that there was trauma to the nail several years ago he just seems to be getting worse rather than better.  She states that it hurts with direct pressure from the top plantarly on the toenail.  Objective: Vital signs are stable she is alert and oriented x3.  Pulses are palpable.  Neurologic sensorium is intact.  dT reflexes are intact.  Muscle strength is normal symmetrical.  Orthopedic evaluation of straight all joints distal ankle full range of motion no crepitation.  Cutaneous evaluation demonstrates supple well-hydrated cutis right foot nail plates appear to be normal left foot nail plates appear to be marginally to moderately infected possibly with fungus or bacterial infection.  She has pain on palpation of the hallux left.  Radiographs lateral view and oblique view taken today demonstrate a dorsal spur just at the very distal aspect of the distal phalanx dorsally.  Assessment: Exostosis hallux left onychomycosis cannot rule out nail dystrophy's.  Plan: Discussed etiology pathology and surgical therapies at this point in time discussed the need for surgical rate regarding the exostectomy.  We discussed trimming the nail in a particular way to help alleviate that.  Also I took samples of the skin and nail today to be sent for pathologic evaluation we will follow-up with her in 1 month to discuss her options.  She is also amenable to having the nail removed completely totally.

## 2019-03-08 ENCOUNTER — Telehealth: Payer: Self-pay | Admitting: *Deleted

## 2019-03-08 NOTE — Telephone Encounter (Signed)
-----   Message from Garrel Ridgel, [place] sent at 03/08/2019  2:56 PM EDT ----- Positive for fungus.

## 2019-03-08 NOTE — Telephone Encounter (Signed)
Left message for pt to call for results  

## 2019-03-09 NOTE — Telephone Encounter (Signed)
Ms. Sisley returned call to [facility] and was informed of the negative fungus results. She verbalized understanding, rescheduled appt. W/ Dr. Milinda Pointer to discuss treatment /options on 03/15/2019@ 8:15.

## 2019-03-10 ENCOUNTER — Telehealth: Payer: Self-pay | Admitting: *Deleted

## 2019-03-10 NOTE — Telephone Encounter (Signed)
-----   Message from Garrel Ridgel, [place] sent at 03/08/2019  2:56 PM EDT ----- Positive for fungus.

## 2019-03-11 NOTE — Telephone Encounter (Signed)
Pt called and I informed pt the results left on her answer machine 03/09/2019 by A. Chestnutt, CMA were encorrect. I told pt the fungal results were positive and she should keep her 04/19/2019 9:15am appt to discuss results and treatment.

## 2019-03-15 ENCOUNTER — Ambulatory Visit: Payer: BLUE CROSS/BLUE SHIELD | Admitting: Podiatry

## 2019-03-22 ENCOUNTER — Ambulatory Visit: Payer: BLUE CROSS/BLUE SHIELD | Admitting: Podiatry

## 2019-04-12 ENCOUNTER — Other Ambulatory Visit: Payer: Self-pay | Admitting: Internal Medicine

## 2019-04-19 ENCOUNTER — Ambulatory Visit: Payer: BLUE CROSS/BLUE SHIELD | Admitting: Podiatry

## 2019-04-28 ENCOUNTER — Encounter: Payer: Self-pay | Admitting: Podiatry

## 2019-04-28 ENCOUNTER — Ambulatory Visit: Payer: BLUE CROSS/BLUE SHIELD | Admitting: Podiatry

## 2019-04-28 ENCOUNTER — Other Ambulatory Visit: Payer: Self-pay

## 2019-04-28 VITALS — Temp 97.7°F

## 2019-04-28 DIAGNOSIS — L603 Nail dystrophy: Secondary | ICD-10-CM

## 2019-04-28 MED ORDER — TERBINAFINE HCL 250 MG PO TABS: 250.0000 mg | ORAL_TABLET | Freq: Every day | ORAL | 0 refills | Status: DC

## 2019-04-28 NOTE — Progress Notes (Signed)
She presents today for follow-up of her pathology results regarding her toenails.  Objective: Vital signs are stable she is alert and oriented x3.  Pathology does demonstrate onychomycosis.  Assessment: Onychomycosis.  Plan: Discussed etiology pathology and surgical therapies.  At this point I evaluated a recent complete metabolic panel which demonstrated normal liver profile.  She takes that states that she has taken Lamisil in the past and it was doing fine until nearly her last pill where she started itching breakout and a rash.  She was told she was allergic to the medication but this is been 20 years now.  She states that she would like to try the medication again.  I express to her the possible side effects and the risk of taking an medication that she may be allergic to which may result in a type I hypersensitivity she understands this and is willing to take the risk.  I will follow-up with her in 1 month as long she has no side effects.  She will call with questions or concerns.

## 2019-04-28 NOTE — Patient Instructions (Signed)

## 2019-05-06 ENCOUNTER — Other Ambulatory Visit: Payer: Self-pay

## 2019-05-10 ENCOUNTER — Other Ambulatory Visit: Payer: Self-pay

## 2019-05-10 ENCOUNTER — Encounter: Payer: Self-pay | Admitting: Obstetrics & Gynecology

## 2019-05-10 ENCOUNTER — Ambulatory Visit (INDEPENDENT_AMBULATORY_CARE_PROVIDER_SITE_OTHER): Payer: BLUE CROSS/BLUE SHIELD | Admitting: Obstetrics & Gynecology

## 2019-05-10 VITALS — BP 118/70 | Ht 63.5 in | Wt 137.0 lb

## 2019-05-10 DIAGNOSIS — Z79890 Hormone replacement therapy (postmenopausal): Secondary | ICD-10-CM

## 2019-05-10 DIAGNOSIS — Z01419 Encounter for gynecological examination (general) (routine) without abnormal findings: Secondary | ICD-10-CM | POA: Diagnosis not present

## 2019-05-10 DIAGNOSIS — M8589 Other specified disorders of bone density and structure, multiple sites: Secondary | ICD-10-CM | POA: Diagnosis not present

## 2019-05-10 MED ORDER — ESTRADIOL 0.5 MG PO TABS: 0.5000 mg | ORAL_TABLET | Freq: Every day | ORAL | 4 refills | Status: DC

## 2019-05-10 MED ORDER — PROGESTERONE MICRONIZED 100 MG PO CAPS: 100.0000 mg | ORAL_CAPSULE | Freq: Every day | ORAL | 4 refills | Status: DC

## 2019-05-10 NOTE — Progress Notes (Signed)
Beth Miranda [birth year] [id number]   History:    58 y.o. G0 Married  RP:  Established patient presenting for annual gyn exam   HPI: Postmenopause, well on HRT x 7 yrs with Estradiol 1 mg PO daily HS and Prometrium 100 mg PO daily HS.  Rare hot flushes.  No PMB.  No pelvic pain.  Abstinent.  Breasts normal.  Urine/BMs normal.  BMI 23.89.  Good nutrition.  Not exercising regularly.  Health labs with Fam MD.  Past medical history,surgical history, family history and social history were all reviewed and documented in the EPIC chart.  Gynecologic History Patient's last menstrual period was 03/02/2013. Contraception: abstinence and post menopausal status Last Pap: 04/2015. Results were: Negative/HPV HR negative Last mammogram: 05/2018. Results were: Negative Bone Density: 05/2017 Osteopenia Colonoscopy: 2013  Obstetric History OB History  Gravida Para Term Preterm AB Living  0 0 0 0 0 0  SAB TAB Ectopic Multiple Live Births  0 0 0 0       ROS: A ROS was performed and pertinent positives and negatives are included in the history.  GENERAL: No fevers or chills. HEENT: No change in vision, no earache, sore throat or sinus congestion. NECK: No pain or stiffness. CARDIOVASCULAR: No chest pain or pressure. No palpitations. PULMONARY: No shortness of breath, cough or wheeze. GASTROINTESTINAL: No abdominal pain, nausea, vomiting or diarrhea, melena or bright red blood per rectum. GENITOURINARY: No urinary frequency, urgency, hesitancy or dysuria. MUSCULOSKELETAL: No joint or muscle pain, no back pain, no recent trauma. DERMATOLOGIC: No rash, no itching, no lesions. ENDOCRINE: No polyuria, polydipsia, no heat or cold intolerance. No recent change in weight. HEMATOLOGICAL: No anemia or easy bruising or bleeding. NEUROLOGIC: No headache, seizures, numbness, tingling or weakness. PSYCHIATRIC: No depression, no loss of interest in normal activity or change in sleep pattern.     Exam:   BP 118/70    Ht 5' 3.5" (1.613 m)   Wt 137 lb (62.1 kg)   LMP 03/02/2013   BMI 23.89 kg/m   Body mass index is 23.89 kg/m.  General appearance : Well developed well nourished female. No acute distress HEENT: Eyes: no retinal hemorrhage or exudates,  Neck supple, trachea midline, no carotid bruits, no thyroidmegaly Lungs: Clear to auscultation, no rhonchi or wheezes, or rib retractions  Heart: Regular rate and rhythm, no murmurs or gallops Breast:Examined in sitting and supine position were symmetrical in appearance, no palpable masses or tenderness,  no skin retraction, no nipple inversion, no nipple discharge, no skin discoloration, no axillary or supraclavicular lymphadenopathy Abdomen: no palpable masses or tenderness, no rebound or guarding Extremities: no edema or skin discoloration or tenderness  Pelvic: Vulva: Normal             Vagina: No gross lesions or discharge  Cervix: No gross lesions or discharge.  Pap reflex done.  Uterus  AV, normal size, shape and consistency, non-tender and mobile  Adnexa  Without masses or tenderness  Anus: Normal   Assessment/Plan:  58 y.o. female for annual exam   1. Encounter for routine gynecological examination with Papanicolaou smear of cervix Normal gynecologic exam in menopause.  Pap reflex done.  Breast exam normal.  Last mammogram June 2019 was negative.  Patient will schedule next screening mammogram in June or early July 2020.  Good body mass index at 23.89.  Increase aerobic activities to 5 times a week and weightlifting every 2 days.  Continue with healthy nutrition.  Health labs  with family physician.  Colonoscopy in 2013.  2. Postmenopausal hormone replacement therapy Well on hormone replacement therapy with estradiol 1 mg daily and progesterone 100 mg daily.  Rare hot flashes.  No contraindication to continue hormone replacement therapy, but will wean to estradiol 0.5 mg daily and keep progesterone 100 mg daily both at bedtime.  Prescription  sent to pharmacy.  No postmenopausal bleeding.    3. Osteopenia of multiple sites Osteopenia per last bone density in June 2018.  Patient will repeat a bone density in June or July 2020.  Continue with vitamin D supplements, calcium intake of 1200 to 1500 mg daily and regular weightbearing physical activities.  Other orders - progesterone (PROMETRIUM) 100 MG capsule; Take 1 capsule (100 mg total) by mouth at bedtime. - estradiol (ESTRACE) 0.5 MG tablet; Take 1 tablet (0.5 mg total) by mouth daily.  Princess Bruins MD, 3:10 PM 05/10/2019

## 2019-05-11 ENCOUNTER — Encounter: Payer: Self-pay | Admitting: Obstetrics & Gynecology

## 2019-05-11 LAB — PAP IG W/ RFLX HPV ASCU

## 2019-05-11 NOTE — Patient Instructions (Addendum)
1. Encounter for routine gynecological examination with Papanicolaou smear of cervix Normal gynecologic exam in menopause.  Pap reflex done.  Breast exam normal.  Last mammogram June 2019 was negative.  Patient will schedule next screening mammogram in June or early July 2020.  Good body mass index at 23.89.  Increase aerobic activities to 5 times a week and weightlifting every 2 days.  Continue with healthy nutrition.  Health labs with family physician.  Colonoscopy in 2013.  2. Postmenopausal hormone replacement therapy Well on hormone replacement therapy with estradiol 1 mg daily and progesterone 100 mg daily.  Rare hot flashes.  No contraindication to continue hormone replacement therapy, but will wean to estradiol 0.5 mg daily and keep progesterone 100 mg daily both at bedtime.  Prescription sent to pharmacy.  No postmenopausal bleeding.    3. Osteopenia of multiple sites Osteopenia per last bone density in June 2018.  Patient will repeat a bone density in June or July 2020.  Continue with vitamin D supplements, calcium intake of 1200 to 1500 mg daily and regular weightbearing physical activities.  Other orders - progesterone (PROMETRIUM) 100 MG capsule; Take 1 capsule (100 mg total) by mouth at bedtime. - estradiol (ESTRACE) 0.5 MG tablet; Take 1 tablet (0.5 mg total) by mouth daily.  Ricca, it was a pleasure seeing you today!  I will inform you of your results as soon as they are available.

## 2019-05-26 ENCOUNTER — Other Ambulatory Visit: Payer: Self-pay

## 2019-05-26 ENCOUNTER — Encounter: Payer: Self-pay | Admitting: Podiatry

## 2019-05-26 ENCOUNTER — Ambulatory Visit: Payer: BC Managed Care – PPO | Admitting: Podiatry

## 2019-05-26 VITALS — Temp 97.6°F

## 2019-05-26 DIAGNOSIS — Z79899 Other long term (current) drug therapy: Secondary | ICD-10-CM

## 2019-05-26 MED ORDER — TERBINAFINE HCL 250 MG PO TABS: 250.0000 mg | ORAL_TABLET | Freq: Every day | ORAL | 0 refills | Status: DC

## 2019-05-26 NOTE — Progress Notes (Signed)
She presents today for follow-up of her Lamisil.  States that she really cannot tell a difference in the toenails as of yet.  States that she has completed 30 days of Lamisil and denies any symptoms from that other than possibly an increase in frequency of her hot flashes.  Objective: Vital signs are stable she is alert and oriented x3.  Pulses are palpable.  No change in the toenail plates as of yet other than maybe possibly is very small area of in the proximal nail fold that appears to be a little more clear.  Assessment: Onychomycosis long-term therapy with Lamisil.  Plan: Requesting another liver profile today can be compared to the previous one.  I also provided her with another prescription for 90 Lamisil and I will follow-up with her in 4 months.  Should her blood work come back abnormal we will notify her immediately.

## 2019-05-27 LAB — HEPATIC FUNCTION PANEL
AG Ratio: 1.6 (calc) (ref 1.0–2.5)
ALT: 8 U/L (ref 6–29)
AST: 16 U/L (ref 10–35)
Albumin: 4.2 g/dL (ref 3.6–5.1)
Alkaline phosphatase (APISO): 44 U/L (ref 37–153)
Bilirubin, Direct: 0.1 mg/dL (ref 0.0–0.2)
Globulin: 2.7 g/dL (calc) (ref 1.9–3.7)
Indirect Bilirubin: 0.2 mg/dL (calc) (ref 0.2–1.2)
Total Bilirubin: 0.3 mg/dL (ref 0.2–1.2)
Total Protein: 6.9 g/dL (ref 6.1–8.1)

## 2019-05-31 ENCOUNTER — Telehealth: Payer: Self-pay | Admitting: *Deleted

## 2019-05-31 NOTE — Telephone Encounter (Signed)
I informed pt of Dr. Hyatt's review of results and orders. 

## 2019-05-31 NOTE — Telephone Encounter (Signed)
-----   Message from Garrel Ridgel, [place] sent at 05/31/2019  7:46 AM EDT ----- Blood work looks normal and should continue medication

## 2019-06-01 ENCOUNTER — Telehealth: Payer: Self-pay | Admitting: Family

## 2019-06-01 DIAGNOSIS — M545 Low back pain, unspecified: Secondary | ICD-10-CM

## 2019-06-01 MED ORDER — NAPROXEN 500 MG PO TABS: 500.0000 mg | ORAL_TABLET | Freq: Two times a day (BID) | ORAL | 0 refills | Status: DC

## 2019-06-01 MED ORDER — BACLOFEN 10 MG PO TABS: 10.0000 mg | ORAL_TABLET | Freq: Three times a day (TID) | ORAL | 0 refills | Status: DC

## 2019-06-01 NOTE — Progress Notes (Signed)

## 2019-06-20 ENCOUNTER — Other Ambulatory Visit: Payer: Self-pay | Admitting: *Deleted

## 2019-06-20 DIAGNOSIS — K219 Gastro-esophageal reflux disease without esophagitis: Secondary | ICD-10-CM

## 2019-06-20 MED ORDER — PANTOPRAZOLE SODIUM 40 MG PO TBEC: DELAYED_RELEASE_TABLET | ORAL | 3 refills | Status: DC

## 2019-07-05 ENCOUNTER — Other Ambulatory Visit: Payer: Self-pay | Admitting: Internal Medicine

## 2019-07-05 DIAGNOSIS — Z1231 Encounter for screening mammogram for malignant neoplasm of breast: Secondary | ICD-10-CM

## 2019-07-16 ENCOUNTER — Other Ambulatory Visit: Payer: Self-pay | Admitting: Internal Medicine

## 2019-08-14 ENCOUNTER — Encounter: Payer: Self-pay | Admitting: Internal Medicine

## 2019-08-14 NOTE — Patient Instructions (Signed)

## 2019-08-14 NOTE — Progress Notes (Signed)
Annual Screening/Preventative Visit & Comprehensive Evaluation &  Examination     This very nice 58 y.o. MWF presents for a Screening /Preventative Visit & comprehensive evaluation and management of multiple medical co-morbidities.  Patient has been followed for HTN, HLD, Hypothyroidism,  Prediabetes  and Vitamin D Deficiency. Patient's GERD is controlled on her meds:     Patient has remote  hx/o sero(+) Mixed Connective Disease Syndrome dating back to the 1980's & w/associated Raynauds' syndrome which has been in remission for many years.      Patient is followed for Labile HTN predates circa 2000. Patient's BP has been controlled at home and patient denies any cardiac symptoms as chest pain, palpitations, shortness of breath, dizziness or ankle swelling. Today's BP is at goal - 122/78.      Patient's hyperlipidemia is not controlled with diet. Last lipids were not at goal: Lab Results  Component Value Date   CHOL 221 (H) 01/31/2019   HDL 74 01/31/2019   LDLCALC 131 (H) 01/31/2019   TRIG 67 01/31/2019   CHOLHDL 3.0 01/31/2019      Patient has hx/o prediabetes (A1c 6.0% / 2012 and 5.9% / 2016)  and patient denies reactive hypoglycemic symptoms, visual blurring, diabetic polys or paresthesias. Last A1c was Normal & at goal: Lab Results  Component Value Date   HGBA1C 5.5 07/26/2018      Patient was dx'd Hypothyroid in 2010 & has been on replacement since.     Finally, patient has history of Vitamin D Deficiency and last Vitamin D was sl low (goal 70-100): Lab Results  Component Value Date   VD25OH 57 07/26/2018   Current Outpatient Medications on File Prior to Visit  Medication Sig  . aspirin 81 MG tablet Take 81 mg by mouth daily.  . Cetirizine HCl (ZYRTEC ALLERGY PO) Take 10 mg by mouth at bedtime.  . Cholecalciferol (VITAMIN D) 2000 UNITS tablet Take 2,000 Units by mouth daily.   . Cinnamon 500 MG capsule Take 2,000 mg by mouth daily.  . Cyanocobalamin (VITAMIN B 12 PO) Take  1,000 mcg by mouth daily.  Marland Kitchen desoximetasone (TOPICORT) 0.25 % cream Apply 1 application topically 2 (two) times daily.  Marland Kitchen estradiol (ESTRACE) 0.5 MG tablet Take 1 tablet (0.5 mg total) by mouth daily.  Marland Kitchen gabapentin (NEURONTIN) 300 MG capsule 300 mg. Takes 2 capsules at bedtime for hot flashes and 1 in the daytime, if needed.  Marland Kitchen levothyroxine (SYNTHROID) 50 MCG tablet Take 1 tablet daily on an empty stomach with only water for 30 minutes & no Antacid meds, Calcium or Magnesium for 4 hours & avoid Biotin  . Magnesium 400 MG TABS Take 1 tablet by mouth 2 (two) times daily.  . Omega-3 Fatty Acids (OMEGA-3 FISH OIL) 1200 MG CAPS Take 2 capsules by mouth daily.  . pantoprazole (PROTONIX) 40 MG tablet Take 1 tablet daily for acid reflux.  . progesterone (PROMETRIUM) 100 MG capsule Take 1 capsule (100 mg total) by mouth at bedtime.  . Red Yeast Rice Extract (RED YEAST RICE PO) Take 2,400 mg by mouth daily.   Marland Kitchen terbinafine (LAMISIL) 250 MG tablet Take 1 tablet (250 mg total) by mouth daily.   No current facility-administered medications on file prior to visit.    Allergies  Allergen Reactions  . Itraconazole   . Lamisil [Terbinafine] Hives  . Penicillins Hives  . Sulfa Antibiotics Hives   Past Medical History:  Diagnosis Date  . Allergy    seasonal  . GERD (  gastroesophageal reflux disease)   . Hyperlipidemia   . Labile hypertension   . Prediabetes   . Raynaud's disease   . Thyroid disease    hypothyroid  . Vitamin D deficiency    Health Maintenance  Topic Date Due  . MAMMOGRAM  06/05/2019  . INFLUENZA VACCINE  07/16/2019  . PAP SMEAR-Modifier  05/09/2022  . COLONOSCOPY  05/28/2022  . TETANUS/TDAP  07/02/2027  . Hepatitis C Screening  Completed  . HIV Screening  Completed   Immunization History  Administered Date(s) Administered  . Hepatitis B 06/06/2002, 07/07/2002, 12/29/2002  . Influenza-Unspecified 10/14/2018  . PPD Test 05/09/2014, 05/10/2015, 06/04/2016, 07/01/2017,  07/26/2018  . Pneumococcal-Unspecified 12/15/1992  . Td 01/06/2007  . Tdap 07/01/2017  . Zoster Recombinat (Shingrix) 03/01/2018, 05/21/2018   Last Colon - 05/28/2012 - Dr Deatra Ina - recc 10 yr f/u due June 2023  Last MGM - 06/04/2018 and f/u scheduled 08/18/2019  Past Surgical History:  Procedure Laterality Date  . TONSILLECTOMY     Family History  Problem Relation Age of Onset  . Pancreatic cancer Paternal Grandmother   . Arthritis Mother        Living  . COPD Mother   . Asthma Mother   . Hypertension Father        Living  . Diabetes Father   . Kidney disease Father        Stones  . Hyperlipidemia Father   . Other Brother        Deceased  . Breast cancer Neg Hx    Social History   Tobacco Use  . Smoking status: Never Smoker  . Smokeless tobacco: Never Used  Substance Use Topics  . Alcohol use: Yes    Alcohol/week: 1.0 standard drinks    Types: 1 Standard drinks or equivalent per week    Comment: 2 glasses  per week  . Drug use: No    ROS Constitutional: Denies fever, chills, weight loss/gain, headaches, insomnia,  night sweats, and change in appetite. Does c/o fatigue. Eyes: Denies redness, blurred vision, diplopia, discharge, itchy, watery eyes.  ENT: Denies discharge, congestion, post nasal drip, epistaxis, sore throat, earache, hearing loss, dental pain, Tinnitus, Vertigo, Sinus pain, snoring.  Cardio: Denies chest pain, palpitations, irregular heartbeat, syncope, dyspnea, diaphoresis, orthopnea, PND, claudication, edema Respiratory: denies cough, dyspnea, DOE, pleurisy, hoarseness, laryngitis, wheezing.  Gastrointestinal: Denies dysphagia, heartburn, reflux, water brash, pain, cramps, nausea, vomiting, bloating, diarrhea, constipation, hematemesis, melena, hematochezia, jaundice, hemorrhoids Genitourinary: Denies dysuria, frequency, urgency, nocturia, hesitancy, discharge, hematuria, flank pain Breast: Breast lumps, nipple discharge, bleeding.  Musculoskeletal:  Denies arthralgia, myalgia, stiffness, Jt. Swelling, pain, limp, and strain/sprain. Denies falls. Skin: Denies puritis, rash, hives, warts, acne, eczema, changing in skin lesion Neuro: No weakness, tremor, incoordination, spasms, paresthesia, pain Psychiatric: Denies confusion, memory loss, sensory loss. Denies Depression. Endocrine: Denies change in weight, skin, hair change, nocturia, and paresthesia, diabetic polys, visual blurring, hyper / hypo glycemic episodes.  Heme/Lymph: No excessive bleeding, bruising, enlarged lymph nodes.  Physical Exam  BP 122/78   Pulse 68   Temp 97.7 F (36.5 C)   Resp 16   Ht 5' 4.5" (1.638 m)   Wt 141 lb 9.6 oz (64.2 kg)   LMP 03/02/2013   BMI 23.93 kg/m   General Appearance: Well nourished, well groomed and in no apparent distress.  Eyes: PERRLA, EOMs, conjunctiva no swelling or erythema, normal fundi and vessels. Sinuses: No frontal/maxillary tenderness ENT/Mouth: EACs patent / TMs  nl. Nares clear without erythema, swelling,  mucoid exudates. Oral hygiene is good. No erythema, swelling, or exudate. Tongue normal, non-obstructing. Tonsils not swollen or erythematous. Hearing normal.  Neck: Supple, thyroid not palpable. No bruits, nodes or JVD. Respiratory: Respiratory effort normal.  BS equal and clear bilateral without rales, rhonci, wheezing or stridor. Cardio: Heart sounds are normal with regular rate and rhythm and no murmurs, rubs or gallops. Peripheral pulses are normal and equal bilaterally without edema. No aortic or femoral bruits. Chest: symmetric with normal excursions and percussion. Breasts: Symmetric, without lumps, nipple discharge, retractions, or fibrocystic changes.  Abdomen: Flat, soft with bowel sounds active. Nontender, no guarding, rebound, hernias, masses, or organomegaly.  Lymphatics: Non tender without lymphadenopathy.  Genitourinary:  Musculoskeletal: Full ROM all peripheral extremities, joint stability, 5/5 strength, and  normal gait. Skin: Warm and dry without rashes, lesions, cyanosis, clubbing or  ecchymosis.  Neuro: Cranial nerves intact, reflexes equal bilaterally. Normal muscle tone, no cerebellar symptoms. Sensation intact.  Pysch: Alert and oriented X 3, normal affect, Insight and Judgment appropriate.   Assessment and Plan  1. Annual Preventative Screening Examination  2. Labile hypertension  - EKG 12-Lead - [place], RETROPERITNL ABD,  LTD - Urinalysis, Routine w reflex microscopic - Microalbumin / creatinine urine ratio - CBC with Differential/Platelet - COMPLETE METABOLIC PANEL WITH GFR - Magnesium - TSH  3. Hyperlipidemia, mixed  - EKG 12-Lead - [place], RETROPERITNL ABD,  LTD - Lipid panel  4. Abnormal glucose  - EKG 12-Lead - [place], RETROPERITNL ABD,  LTD - Hemoglobin A1c - Insulin, random  5. Vitamin D deficiency  - VITAMIN D 25 Hydroxyl  6. Prediabetes  - EKG 12-Lead - [place], RETROPERITNL ABD,  LTD - Hemoglobin A1c - Insulin, random  7. Hypothyroidism  - TSH  8. Gastroesophageal reflux disease  - POC Hemoccult Bld/Stl  9. Screening examination for pulmonary tuberculosis  - TB Skin Test  10. Screening for colorectal cancer   11. Screening for ischemic heart disease  - EKG 12-Lead  12. FH: hypertension  - EKG 12-Lead - [place], RETROPERITNL ABD,  LTD  13. Screening for AAA (aortic abdominal aneurysm)  - [place], RETROPERITNL ABD,  LTD  14. Fatigue  - Iron,Total/Total Iron Binding Cap - Vitamin B12  15. Medication management  - Urinalysis, Routine w reflex microscopic - Microalbumin / creatinine urine ratio - CBC with Differential/Platelet - COMPLETE METABOLIC PANEL WITH GFR - Magnesium - Lipid panel - TSH - Hemoglobin A1c - Insulin, random - VITAMIN D 25 Hydroxyl          Patient was counseled in prudent diet to achieve/maintain BMI less than 25 for weight control, BP monitoring, regular exercise and medications. Discussed med's effects and SE's. Screening  labs and tests as requested with regular follow-up as recommended. Over 40 minutes of exam, counseling, chart review and high complex critical decision making was performed.   Kirtland Bouchard, MD

## 2019-08-15 ENCOUNTER — Encounter: Payer: Self-pay | Admitting: Internal Medicine

## 2019-08-15 ENCOUNTER — Other Ambulatory Visit: Payer: Self-pay

## 2019-08-15 ENCOUNTER — Ambulatory Visit: Payer: BC Managed Care – PPO | Admitting: Internal Medicine

## 2019-08-15 VITALS — BP 122/78 | HR 68 | Temp 97.7°F | Resp 16 | Ht 64.5 in | Wt 141.6 lb

## 2019-08-15 DIAGNOSIS — R7309 Other abnormal glucose: Secondary | ICD-10-CM

## 2019-08-15 DIAGNOSIS — Z1329 Encounter for screening for other suspected endocrine disorder: Secondary | ICD-10-CM | POA: Diagnosis not present

## 2019-08-15 DIAGNOSIS — Z111 Encounter for screening for respiratory tuberculosis: Secondary | ICD-10-CM | POA: Diagnosis not present

## 2019-08-15 DIAGNOSIS — R0989 Other specified symptoms and signs involving the circulatory and respiratory systems: Secondary | ICD-10-CM

## 2019-08-15 DIAGNOSIS — R7303 Prediabetes: Secondary | ICD-10-CM

## 2019-08-15 DIAGNOSIS — Z0000 Encounter for general adult medical examination without abnormal findings: Secondary | ICD-10-CM

## 2019-08-15 DIAGNOSIS — Z130 Encounter for screening for diseases of the blood and blood-forming organs and certain disorders involving the immune mechanism: Secondary | ICD-10-CM | POA: Diagnosis not present

## 2019-08-15 DIAGNOSIS — Z79899 Other long term (current) drug therapy: Secondary | ICD-10-CM | POA: Diagnosis not present

## 2019-08-15 DIAGNOSIS — Z131 Encounter for screening for diabetes mellitus: Secondary | ICD-10-CM

## 2019-08-15 DIAGNOSIS — E559 Vitamin D deficiency, unspecified: Secondary | ICD-10-CM | POA: Diagnosis not present

## 2019-08-15 DIAGNOSIS — Z8249 Family history of ischemic heart disease and other diseases of the circulatory system: Secondary | ICD-10-CM

## 2019-08-15 DIAGNOSIS — R5383 Other fatigue: Secondary | ICD-10-CM

## 2019-08-15 DIAGNOSIS — Z13220 Encounter for screening for lipoid disorders: Secondary | ICD-10-CM | POA: Diagnosis not present

## 2019-08-15 DIAGNOSIS — Z1389 Encounter for screening for other disorder: Secondary | ICD-10-CM | POA: Diagnosis not present

## 2019-08-15 DIAGNOSIS — E782 Mixed hyperlipidemia: Secondary | ICD-10-CM

## 2019-08-15 DIAGNOSIS — Z0001 Encounter for general adult medical examination with abnormal findings: Secondary | ICD-10-CM

## 2019-08-15 DIAGNOSIS — E039 Hypothyroidism, unspecified: Secondary | ICD-10-CM

## 2019-08-15 DIAGNOSIS — Z1211 Encounter for screening for malignant neoplasm of colon: Secondary | ICD-10-CM

## 2019-08-15 DIAGNOSIS — Z136 Encounter for screening for cardiovascular disorders: Secondary | ICD-10-CM | POA: Diagnosis not present

## 2019-08-15 DIAGNOSIS — K219 Gastro-esophageal reflux disease without esophagitis: Secondary | ICD-10-CM

## 2019-08-16 LAB — CBC WITH DIFFERENTIAL/PLATELET
Absolute Monocytes: 372 cells/uL (ref 200–950)
Basophils Absolute: 30 cells/uL (ref 0–200)
Basophils Relative: 0.8 %
Eosinophils Absolute: 61 cells/uL (ref 15–500)
Eosinophils Relative: 1.6 %
HCT: 39.9 % (ref 35.0–45.0)
Hemoglobin: 13.2 g/dL (ref 11.7–15.5)
Lymphs Abs: 1060 cells/uL (ref 850–3900)
MCH: 29.1 pg (ref 27.0–33.0)
MCHC: 33.1 g/dL (ref 32.0–36.0)
MCV: 87.9 fL (ref 80.0–100.0)
MPV: 10.7 fL (ref 7.5–12.5)
Monocytes Relative: 9.8 %
Neutro Abs: 2276 cells/uL (ref 1500–7800)
Neutrophils Relative %: 59.9 %
Platelets: 208 10*3/uL (ref 140–400)
RBC: 4.54 10*6/uL (ref 3.80–5.10)
RDW: 12.8 % (ref 11.0–15.0)
Total Lymphocyte: 27.9 %
WBC: 3.8 10*3/uL (ref 3.8–10.8)

## 2019-08-16 LAB — COMPLETE METABOLIC PANEL WITH GFR
AG Ratio: 1.6 (calc) (ref 1.0–2.5)
ALT: 12 U/L (ref 6–29)
AST: 16 U/L (ref 10–35)
Albumin: 4.4 g/dL (ref 3.6–5.1)
Alkaline phosphatase (APISO): 39 U/L (ref 37–153)
BUN: 14 mg/dL (ref 7–25)
CO2: 29 mmol/L (ref 20–32)
Calcium: 9.5 mg/dL (ref 8.6–10.4)
Chloride: 105 mmol/L (ref 98–110)
Creat: 0.88 mg/dL (ref 0.50–1.05)
GFR, Est African American: 85 mL/min/{1.73_m2} (ref >=60)
GFR, Est Non African American: 73 mL/min/{1.73_m2} (ref >=60)
Globulin: 2.8 g/dL (calc) (ref 1.9–3.7)
Glucose, Bld: 88 mg/dL (ref 65–99)
Potassium: 4 mmol/L (ref 3.5–5.3)
Sodium: 142 mmol/L (ref 135–146)
Total Bilirubin: 0.4 mg/dL (ref 0.2–1.2)
Total Protein: 7.2 g/dL (ref 6.1–8.1)

## 2019-08-16 LAB — URINALYSIS, ROUTINE W REFLEX MICROSCOPIC
Bilirubin Urine: NEGATIVE
Glucose, UA: NEGATIVE
Hgb urine dipstick: NEGATIVE
Leukocytes,Ua: NEGATIVE
Nitrite: NEGATIVE
Protein, ur: NEGATIVE
Specific Gravity, Urine: 1.009 (ref 1.001–1.03)
pH: 6.5 (ref 5.0–8.0)

## 2019-08-16 LAB — IRON, TOTAL/TOTAL IRON BINDING CAP
%SAT: 28 % (calc) (ref 16–45)
Iron: 75 ug/dL (ref 45–160)
TIBC: 271 mcg/dL (calc) (ref 250–450)

## 2019-08-16 LAB — VITAMIN D 25 HYDROXY (VIT D DEFICIENCY, FRACTURES): Vit D, 25-Hydroxy: 51 ng/mL (ref 30–100)

## 2019-08-16 LAB — LIPID PANEL
Cholesterol: 200 mg/dL — ABNORMAL HIGH (ref <200)
HDL: 69 mg/dL (ref >=50)
LDL Cholesterol (Calc): 115 mg/dL (calc) — ABNORMAL HIGH
Non-HDL Cholesterol (Calc): 131 mg/dL (calc) — ABNORMAL HIGH (ref <130)
Total CHOL/HDL Ratio: 2.9 (calc) (ref <5.0)
Triglycerides: 67 mg/dL (ref <150)

## 2019-08-16 LAB — HEMOGLOBIN A1C
Hgb A1c MFr Bld: 5.5 % of total Hgb (ref <5.7)
Mean Plasma Glucose: 111 (calc)
eAG (mmol/L): 6.2 (calc)

## 2019-08-16 LAB — MICROALBUMIN / CREATININE URINE RATIO
Creatinine, Urine: 44 mg/dL (ref 20–275)
Microalb, Ur: <0.2 mg/dL

## 2019-08-16 LAB — INSULIN, RANDOM: Insulin: 3 u[IU]/mL

## 2019-08-16 LAB — VITAMIN B12: Vitamin B-12: 691 pg/mL (ref 200–1100)

## 2019-08-16 LAB — MAGNESIUM: Magnesium: 2.1 mg/dL (ref 1.5–2.5)

## 2019-08-16 LAB — TSH: TSH: 2.02 mIU/L (ref 0.40–4.50)

## 2019-08-18 ENCOUNTER — Other Ambulatory Visit: Payer: Self-pay

## 2019-08-18 ENCOUNTER — Ambulatory Visit
Admission: RE | Admit: 2019-08-18 | Discharge: 2019-08-18 | Disposition: A | Discharge status: Home / Self Care | Payer: BC Managed Care – PPO | Source: Ambulatory Visit | Attending: Internal Medicine | Admitting: Internal Medicine

## 2019-08-18 DIAGNOSIS — Z1231 Encounter for screening mammogram for malignant neoplasm of breast: Secondary | ICD-10-CM

## 2019-08-29 ENCOUNTER — Other Ambulatory Visit: Payer: Self-pay | Admitting: Internal Medicine

## 2019-08-30 ENCOUNTER — Other Ambulatory Visit: Payer: Self-pay

## 2019-08-30 DIAGNOSIS — Z1211 Encounter for screening for malignant neoplasm of colon: Secondary | ICD-10-CM

## 2019-08-30 LAB — POC HEMOCCULT BLD/STL (HOME/3-CARD/SCREEN)
Card #2 Fecal Occult Blod, POC: NEGATIVE
Card #3 Fecal Occult Blood, POC: NEGATIVE
Fecal Occult Blood, POC: NEGATIVE

## 2019-09-11 NOTE — Progress Notes (Signed)
Subjective:    Patient ID: Beth Miranda, female    DOB: 03/27/1961, 58 y.o.   MRN: [id number]  HPI    Patient is a very nice 58 yo MWF with hx/o labile HTN, HLD, abn glucose, hypothyroidism, GERD and Vit D deficiency who presents with no c/o HA's , dizziness, CP, Palpitations, dyspnea or dependent edema. She also has concerns re: several darkly pigmented flat lesions of her RLE.   Medication Sig  . aspirin 81 MG tablet Take  daily.  . Cetirizine HCl  Take  at bedtime.  Marland Kitchen VITAMIN D 2000 UNITS  Take 2,000 Units  daily.   . Cinnamon 500 MG caps Take 2,000 mg  daily.  Marland Kitchen VITAMIN B 12 tab 1,000 mcg  Takedaily.  . TOPICORT 0.25 % cream Apply 1 application topically 2 times daily.  Marland Kitchen estradiol 0.5 MG tablet Take 1 tablet daily.  Marland Kitchen gabapentin  300 MG capsule TAKE 1 -2 CAPS 2 - 3 x DAILY AS NEEDED   . levothyroxine  50 MCG tablet Take 1 tablet daily   . Magnesium 400 MG TABS Take 1 tablet  2  times daily.  . Omega-3  FISH OIL 1200 MG Take 2 capsules  daily.  . pantoprazole  40 MG tablet Take 1 tablet daily for acid reflux.  . progesterone 100 MG capsule Take 1 capsule  at bedtime.  . Red Yeast Rice Extract  Take 2,400 mg  daily.   Marland Kitchen terbinafine  250 MG tablet Take 1 tablet (250 mg total) by mouth daily.   No facility-administered medications prior to visit.    Allergies  Allergen Reactions  . Itraconazole   . Lamisil [Terbinafine] Hives  . Penicillins Hives  . Sulfa Antibiotics Hives   Past Medical History:  Diagnosis Date  . Allergy    seasonal  . GERD (gastroesophageal reflux disease)   . Hyperlipidemia   . Labile hypertension   . Prediabetes   . Raynaud's disease   . Thyroid disease    hypothyroid  . Vitamin D deficiency      Review of Systems     Objective:   Physical Exam  BP 126/80   Pulse 84   Temp (!) 97 F (36.1 C)   Resp 16   Ht 5' 4.5" (1.638 m)   Wt 141 lb 9.6 oz (64.2 kg)   LMP 03/02/2013   BMI 23.93 kg/m   HEENT - WNL. Neck - supple.  Chest  - Clear equal BS. Cor - Nl HS. RRR w/o sig MGR. PP 1(+). No edema. MS- FROM w/o deformities.  Gait Nl. Neuro -  Nl w/o focal abnormalities. Skin - on the RLE there are 3 suspect lesions:   (#1) 7 mm dark brown flat slightly irregular lesion of the Proximal medial Right shin;    (#2) 71mm similiar lesion of Right medial ankle;     (#3) 5 mm similar lesion of Right anterior ankle.  Procedure (CPT: [id number] & 11300 x 2 )     After informed consent and aseptic prep, each lesion was locally infiltrated with ~ 0.3-4 ml of Marcaine 0.5%  Then each was sharply excised with a #10 scalpel by shave excisional technique.  Each lesion was identified in location & sent for path analysis. Then post excision, each biopsy site was deeply hyfrecated to destroy an remnant lesion tissue. Then antibiotic ung was applied and covered with a 2" x 3" Tegaderm dressing.  Patient was instructed in post-op wound care.  Assessment & Plan:   1. Labile hypertension  2. Neoplasm of uncertain behavior of skin  - Dermatology pathology

## 2019-09-12 ENCOUNTER — Ambulatory Visit (INDEPENDENT_AMBULATORY_CARE_PROVIDER_SITE_OTHER): Payer: BC Managed Care – PPO | Admitting: Internal Medicine

## 2019-09-12 ENCOUNTER — Other Ambulatory Visit: Payer: Self-pay

## 2019-09-12 ENCOUNTER — Other Ambulatory Visit: Payer: Self-pay | Admitting: Internal Medicine

## 2019-09-12 VITALS — BP 126/80 | HR 84 | Temp 97.0°F | Resp 16 | Ht 64.5 in | Wt 141.6 lb

## 2019-09-12 DIAGNOSIS — D485 Neoplasm of uncertain behavior of skin: Secondary | ICD-10-CM | POA: Diagnosis not present

## 2019-09-12 DIAGNOSIS — R0989 Other specified symptoms and signs involving the circulatory and respiratory systems: Secondary | ICD-10-CM

## 2019-09-29 ENCOUNTER — Encounter: Payer: Self-pay | Admitting: Podiatry

## 2019-09-29 ENCOUNTER — Ambulatory Visit: Payer: BC Managed Care – PPO | Admitting: Podiatry

## 2019-09-29 ENCOUNTER — Other Ambulatory Visit: Payer: Self-pay

## 2019-09-29 DIAGNOSIS — L603 Nail dystrophy: Secondary | ICD-10-CM | POA: Diagnosis not present

## 2019-09-29 MED ORDER — TERBINAFINE HCL 250 MG PO TABS: 250.0000 mg | ORAL_TABLET | Freq: Every day | ORAL | 0 refills | Status: DC

## 2019-09-29 NOTE — Progress Notes (Signed)
She presents today for follow-up of her nail fungus that she has completed 120 days of Lamisil denies fever chills nausea vomiting muscle aches and pain states it made her stomach feel a little bit uneasy toward the end of the dosages.  Objective: Vital signs are stable she is alert and oriented x3 nails appear to be improving.  Assessment: Resolving onychomycosis.  Plan: Continue the use of Lamisil 1 tablet every other day for the next 60 days I will follow up with her in 90 days.

## 2019-09-29 NOTE — Patient Instructions (Signed)
Dr. Hyatt has sent over a refill for Lamisil to your pharmacy today. The instructions on your bottle will say "take 1 tablet daily", however, he would like for you to take one pill every other day. He will follow up with you in 3 months to re-evaluate your toenails. 

## 2019-10-11 ENCOUNTER — Ambulatory Visit: Payer: BC Managed Care – PPO

## 2019-10-19 ENCOUNTER — Telehealth: Payer: Self-pay

## 2019-10-19 NOTE — Telephone Encounter (Signed)
Yes, as long as she understands that the risks are higher at a higher dose with time.

## 2019-10-19 NOTE — Telephone Encounter (Signed)
Patient said at [facility] in May she talked with you about decreasing dose of Estradiol. She went from 1mg  to 0.05mg . She said she is experiencing more hotflashes and night sweats that are interrupting her sleep and would like to go back on the 1 mg dose.  Ok to send Rx for 1 mg. Estradiol?

## 2019-10-20 MED ORDER — ESTRADIOL 1 MG PO TABS: 1.0000 mg | ORAL_TABLET | Freq: Every day | ORAL | 2 refills | Status: DC

## 2019-10-20 NOTE — Telephone Encounter (Signed)
Spoke with patient and informed her. She does accept additional risks blood clot, Dvt's, stroke.  Rx sent.

## 2019-11-01 ENCOUNTER — Other Ambulatory Visit: Payer: Self-pay | Admitting: Internal Medicine

## 2019-11-01 DIAGNOSIS — K219 Gastro-esophageal reflux disease without esophagitis: Secondary | ICD-10-CM

## 2019-11-14 ENCOUNTER — Ambulatory Visit: Payer: BC Managed Care – PPO | Admitting: Adult Health

## 2019-11-18 ENCOUNTER — Other Ambulatory Visit: Payer: Self-pay | Admitting: Internal Medicine

## 2020-01-03 ENCOUNTER — Ambulatory Visit: Payer: BC Managed Care – PPO | Admitting: Podiatry

## 2020-01-12 ENCOUNTER — Other Ambulatory Visit: Payer: Self-pay

## 2020-01-12 ENCOUNTER — Ambulatory Visit: Payer: BC Managed Care – PPO | Admitting: Podiatry

## 2020-01-12 ENCOUNTER — Encounter: Payer: Self-pay | Admitting: Podiatry

## 2020-01-12 DIAGNOSIS — L603 Nail dystrophy: Secondary | ICD-10-CM | POA: Diagnosis not present

## 2020-01-12 MED ORDER — TERBINAFINE HCL 250 MG PO TABS: 250.0000 mg | ORAL_TABLET | Freq: Every day | ORAL | 0 refills | Status: DC

## 2020-01-12 NOTE — Progress Notes (Signed)
She presents today for follow-up of her Lamisil therapy states that she is completed her first every other day dosing of Lamisil she states that it looks some better particular she refers to the hallux right.  The rest of the toes appear to be nearly 100% grown out.  Objective: Vital signs are stable alert and oriented x3 there is no erythema edema cellulitis drainage or odor she had no problems taking the medicine.  Assessment: Well healing onychomycosis long-term therapy with Lamisil.  Plan: Continue every other day dosing follow-up with her in 3 months refilled a 30 tablet prescription.

## 2020-02-07 ENCOUNTER — Other Ambulatory Visit: Payer: Self-pay

## 2020-02-07 ENCOUNTER — Ambulatory Visit: Payer: BC Managed Care – PPO | Admitting: Adult Health

## 2020-02-07 ENCOUNTER — Encounter: Payer: Self-pay | Admitting: Adult Health

## 2020-02-07 VITALS — BP 120/70 | HR 74 | Temp 97.3°F | Wt 135.0 lb

## 2020-02-07 DIAGNOSIS — R110 Nausea: Secondary | ICD-10-CM

## 2020-02-07 DIAGNOSIS — K219 Gastro-esophageal reflux disease without esophagitis: Secondary | ICD-10-CM

## 2020-02-07 DIAGNOSIS — E039 Hypothyroidism, unspecified: Secondary | ICD-10-CM | POA: Diagnosis not present

## 2020-02-07 DIAGNOSIS — R0989 Other specified symptoms and signs involving the circulatory and respiratory systems: Secondary | ICD-10-CM

## 2020-02-07 DIAGNOSIS — F321 Major depressive disorder, single episode, moderate: Secondary | ICD-10-CM | POA: Diagnosis not present

## 2020-02-07 DIAGNOSIS — Z79899 Other long term (current) drug therapy: Secondary | ICD-10-CM

## 2020-02-07 DIAGNOSIS — E782 Mixed hyperlipidemia: Secondary | ICD-10-CM | POA: Diagnosis not present

## 2020-02-07 MED ORDER — ESCITALOPRAM OXALATE 20 MG PO TABS: 20.0000 mg | ORAL_TABLET | Freq: Every day | ORAL | 1 refills | Status: DC

## 2020-02-07 MED ORDER — BUSPIRONE HCL 5 MG PO TABS: 5.0000 mg | ORAL_TABLET | Freq: Three times a day (TID) | ORAL | 1 refills | Status: DC

## 2020-02-07 MED ORDER — ONDANSETRON HCL 4 MG PO TABS: 4.0000 mg | ORAL_TABLET | Freq: Three times a day (TID) | ORAL | 1 refills | Status: DC | PRN

## 2020-02-07 NOTE — Patient Instructions (Addendum)
Start and stop buspar gradually - taper over 1 week - temporary medication to bridge until lexapro is working in 8-12 weeks  Recommend reaching out for counseling  Recommend avoiding alcohol, ibuprofen, aleve, mobic, motrin, etc NSAIDs  Please let me know if symptoms are getting worse  Please go to the ER if you have any severe AB pain, unable to hold down food/water, blood in stool or vomit, chest pain, shortness of breath.   Buspirone tablets What is this medicine? BUSPIRONE (byoo SPYE rone) is used to treat anxiety disorders. This medicine may be used for other purposes; ask your health care provider or pharmacist if you have questions. COMMON BRAND NAME(S): BuSpar What should I tell my health care provider before I take this medicine? They need to know if you have any of these conditions:  kidney or liver disease  an unusual or allergic reaction to buspirone, other medicines, foods, dyes, or preservatives  pregnant or trying to get pregnant  breast-feeding How should I use this medicine? Take this medicine by mouth with a glass of water. Follow the directions on the prescription label. You may take this medicine with or without food. To ensure that this medicine always works the same way for you, you should take it either always with or always without food. Take your doses at regular intervals. Do not take your medicine more often than directed. Do not stop taking except on the advice of your doctor or health care professional. Talk to your pediatrician regarding the use of this medicine in children. Special care may be needed. Overdosage: If you think you have taken too much of this medicine contact a poison control center or emergency room at once. NOTE: This medicine is only for you. Do not share this medicine with others. What if I miss a dose? If you miss a dose, take it as soon as you can. If it is almost time for your next dose, take only that dose. Do not take double or  extra doses. What may interact with this medicine? Do not take this medicine with any of the following medications:  linezolid  MAOIs like Carbex, Eldepryl, Marplan, Nardil, and Parnate  methylene blue  procarbazine This medicine may also interact with the following medications:  diazepam  digoxin  diltiazem  erythromycin  grapefruit juice  haloperidol  medicines for mental depression or mood problems  medicines for seizures like carbamazepine, phenobarbital and phenytoin  nefazodone  other medications for anxiety  rifampin  ritonavir  some antifungal medicines like itraconazole, ketoconazole, and voriconazole  verapamil  warfarin This list may not describe all possible interactions. Give your health care provider a list of all the medicines, herbs, non-prescription drugs, or dietary supplements you use. Also tell them if you smoke, drink alcohol, or use illegal drugs. Some items may interact with your medicine. What should I watch for while using this medicine? Visit your doctor or health care professional for regular checks on your progress. It may take 1 to 2 weeks before your anxiety gets better. You may get drowsy or dizzy. Do not drive, use machinery, or do anything that needs mental alertness until you know how this drug affects you. Do not stand or sit up quickly, especially if you are an older patient. This reduces the risk of dizzy or fainting spells. Alcohol can make you more drowsy and dizzy. Avoid alcoholic drinks. What side effects may I notice from receiving this medicine? Side effects that you should report to your  doctor or health care professional as soon as possible:  blurred vision or other vision changes  chest pain  confusion  difficulty breathing  feelings of hostility or anger  muscle aches and pains  numbness or tingling in hands or feet  ringing in the ears  skin rash and itching  vomiting  weakness Side effects that  usually do not require medical attention (report to your doctor or health care professional if they continue or are bothersome):  disturbed dreams, nightmares  headache  nausea  restlessness or nervousness  sore throat and nasal congestion  stomach upset This list may not describe all possible side effects. Call your doctor for medical advice about side effects. You may report side effects to FDA at 1-800-FDA-1088. Where should I keep my medicine? Keep out of the reach of children. Store at room temperature below 30 degrees C (86 degrees F). Protect from light. Keep container tightly closed. Throw away any unused medicine after the expiration date. NOTE: This sheet is a summary. It may not cover all possible information. If you have questions about this medicine, talk to your doctor, pharmacist, or health care provider.  2020 Elsevier/Gold Standard (2010-07-11 18:06:11)   Escitalopram tablets What is this medicine? ESCITALOPRAM (es sye TAL oh pram) is used to treat depression and certain types of anxiety. This medicine may be used for other purposes; ask your health care provider or pharmacist if you have questions. COMMON BRAND NAME(S): Lexapro What should I tell my health care provider before I take this medicine? They need to know if you have any of these conditions:  bipolar disorder or a family history of bipolar disorder  diabetes  glaucoma  heart disease  kidney or liver disease  receiving electroconvulsive therapy  seizures (convulsions)  suicidal thoughts, plans, or attempt by you or a family member  an unusual or allergic reaction to escitalopram, the related drug citalopram, other medicines, foods, dyes, or preservatives  pregnant or trying to become pregnant  breast-feeding How should I use this medicine? Take this medicine by mouth with a glass of water. Follow the directions on the prescription label. You can take it with or without food. If it upsets  your stomach, take it with food. Take your medicine at regular intervals. Do not take it more often than directed. Do not stop taking this medicine suddenly except upon the advice of your doctor. Stopping this medicine too quickly may cause serious side effects or your condition may worsen. A special MedGuide will be given to you by the pharmacist with each prescription and refill. Be sure to read this information carefully each time. Talk to your pediatrician regarding the use of this medicine in children. Special care may be needed. Overdosage: If you think you have taken too much of this medicine contact a poison control center or emergency room at once. NOTE: This medicine is only for you. Do not share this medicine with others. What if I miss a dose? If you miss a dose, take it as soon as you can. If it is almost time for your next dose, take only that dose. Do not take double or extra doses. What may interact with this medicine? Do not take this medicine with any of the following medications:  certain medicines for fungal infections like fluconazole, itraconazole, ketoconazole, posaconazole, voriconazole  cisapride  citalopram  dronedarone  linezolid  MAOIs like Carbex, Eldepryl, Marplan, Nardil, and Parnate  methylene blue (injected into a vein)  pimozide  thioridazine  This medicine may also interact with the following medications:  alcohol  amphetamines  aspirin and aspirin-like medicines  carbamazepine  certain medicines for depression, anxiety, or psychotic disturbances  certain medicines for migraine headache like almotriptan, eletriptan, frovatriptan, naratriptan, rizatriptan, sumatriptan, zolmitriptan  certain medicines for sleep  certain medicines that treat or prevent blood clots like warfarin, enoxaparin, dalteparin  cimetidine  diuretics  dofetilide  fentanyl  furazolidone  isoniazid  lithium  metoprolol  NSAIDs, medicines for pain and  inflammation, like ibuprofen or naproxen  other medicines that prolong the QT interval (cause an abnormal heart rhythm)  procarbazine  rasagiline  supplements like St. John's wort, kava kava, valerian  tramadol  tryptophan  ziprasidone This list may not describe all possible interactions. Give your health care provider a list of all the medicines, herbs, non-prescription drugs, or dietary supplements you use. Also tell them if you smoke, drink alcohol, or use illegal drugs. Some items may interact with your medicine. What should I watch for while using this medicine? Tell your doctor if your symptoms do not get better or if they get worse. Visit your doctor or health care professional for regular checks on your progress. Because it may take several weeks to see the full effects of this medicine, it is important to continue your treatment as prescribed by your doctor. Patients and their families should watch out for new or worsening thoughts of suicide or depression. Also watch out for sudden changes in feelings such as feeling anxious, agitated, panicky, irritable, hostile, aggressive, impulsive, severely restless, overly excited and hyperactive, or not being able to sleep. If this happens, especially at the beginning of treatment or after a change in dose, call your health care professional. Dennis Bast may get drowsy or dizzy. Do not drive, use machinery, or do anything that needs mental alertness until you know how this medicine affects you. Do not stand or sit up quickly, especially if you are an older patient. This reduces the risk of dizzy or fainting spells. Alcohol may interfere with the effect of this medicine. Avoid alcoholic drinks. Your mouth may get dry. Chewing sugarless gum or sucking hard candy, and drinking plenty of water may help. Contact your doctor if the problem does not go away or is severe. What side effects may I notice from receiving this medicine? Side effects that you should  report to your doctor or health care professional as soon as possible:  allergic reactions like skin rash, itching or hives, swelling of the face, lips, or tongue  anxious  black, tarry stools  changes in vision  confusion  elevated mood, decreased need for sleep, racing thoughts, impulsive behavior  eye pain  fast, irregular heartbeat  feeling faint or lightheaded, falls  feeling agitated, angry, or irritable  hallucination, loss of contact with reality  loss of balance or coordination  loss of memory  painful or prolonged erections  restlessness, pacing, inability to keep still  seizures  stiff muscles  suicidal thoughts or other mood changes  trouble sleeping  unusual bleeding or bruising  unusually weak or tired  vomiting Side effects that usually do not require medical attention (report to your doctor or health care professional if they continue or are bothersome):  changes in appetite  change in sex drive or performance  headache  increased sweating  indigestion, nausea  tremors This list may not describe all possible side effects. Call your doctor for medical advice about side effects. You may report side effects to FDA  at 1-800-FDA-1088. Where should I keep my medicine? Keep out of reach of children. Store at room temperature between 15 and 30 degrees C (59 and 86 degrees F). Throw away any unused medicine after the expiration date. NOTE: This sheet is a summary. It may not cover all possible information. If you have questions about this medicine, talk to your doctor, pharmacist, or health care provider.  2020 Elsevier/Gold Standard (2018-11-22 11:21:44)

## 2020-02-07 NOTE — Progress Notes (Signed)
Acute visit with 6 MONTH FOLLOW UP  Assessment and Plan:  Hypertension At goal at this time off of medications Monitor blood pressure at home; patient to call if consistently greater than 130/80 Continue DASH diet.   Reminder to go to the ER if any CP, SOB, nausea, dizziness, severe HA, changes vision/speech, left arm numbness and tingling and jaw pain.  Cholesterol Currently with mild elevations not requiring statin treatment; on RYRS, omega 3 supplements goal maintain LDL <130 Continue low cholesterol diet and exercise.  Check lipid panel.   Abnormal glucose Recent A1Cs at goal Discussed diet/exercise, weight management  Defer A1C; check CMP  Hypothyroidism continue medications the same pending lab results reminded to take on an empty stomach 30-45mins before food.  check TSH level  BMI 25 Continue to recommend diet heavy in fruits and veggies and low in animal meats, cheeses, and dairy products, appropriate calorie intake Discuss exercise recommendations routinely Continue to monitor weight at each visit  Vitamin D Def Near goal at last visit; continue supplementation to maintain goal of 60-100 Defer Vit D level  GERD Well managed on current medications; didn't tolerate taper from PPI to H2i Discussed diet, avoiding triggers and other lifestyle changes  Major depression with anxiety ([place]) Start new medications as prescribed; buspar to bridge Stress management techniques discussed, increase water, good sleep hygiene discussed, increase exercise, and increase veggies.  Follow up 1 month, call the office if any new AE's from medications and we will switch them -     escitalopram (LEXAPRO) 20 MG tablet; Take 1 tablet (20 mg total) by mouth daily. -     busPIRone (BUSPAR) 5 MG tablet; Take 1 tablet (5 mg total) by mouth 3 (three) times daily.  Nausea Benign exam, patient thinks this is r/t anxiety/mood Will give nausea medication, mood management as per above Close  follow up, consider ultrasound if not improving over the next 2 weeks with mood medication -     ondansetron (ZOFRAN) 4 MG tablet; Take 1 tablet (4 mg total) by mouth every 8 (eight) hours as needed for nausea or vomiting. -     CBC with Differential/Platelet -     COMPLETE METABOLIC PANEL WITH GFR -     Urinalysis w microscopic + reflex cultu   Further disposition pending results of labs. Discussed med's effects and SE's.   Over 30 minutes of exam, counseling, chart review, and critical decision making was performed.   Future Appointments  Date Time Provider [place]  02/15/2020  2:30 PM Unk Pinto, MD [name]  04/12/2020  8:45 AM Milinda Pointer, Max T, DPM [facility]  05/11/2020  2:00 PM Princess Bruins, MD [facility]  08/30/2020  2:00 PM Unk Pinto, MD [name]    ------------------------------------------------------------------------------------------------------------------   HPI BP 120/70   Pulse 74   Temp (!) 97.3 F (36.3 C)   Wt 135 lb (61.2 kg)   LMP 03/02/2013   SpO2 92%   BMI 22.81 kg/m   59 y.o.female presents for evaluation due to nausea and poor appetite for 1 week, soft stools in the last 2 days, denies blood, watery stools, just soft formed with normal color. Denies abdominal pain. Denies alcohol intake, denies recent use of NSAIDs other than 2 tabs of excedrine a few weeks ago. Denies sense of early satiety, dysphagia.   She denies fever/chills, headaches, dizziness, rash. No unusual foods, husband has fine.   She reports has been very stressed with bad weather, both parents are  declining in health, has been really struggling in the last week. Being crying more easily, worrying constantly, not sleeping well at night. She feels stress is cause of nausea/GI sx.   BMI is Body mass index is 22.81 kg/m., she has been working on diet and exercise. Wt Readings from Last 3 Encounters:  02/07/20 135 lb (61.2 kg)  09/12/19 141 lb  9.6 oz (64.2 kg)  08/15/19 141 lb 9.6 oz (64.2 kg)   Her blood pressure has been controlled at home, today their BP is BP: 120/70  She does workout. She denies chest pain, shortness of breath, dizziness.  She is not on cholesterol medication and denies myalgias. Her cholesterol is not at goal. The cholesterol last visit was:   Lab Results  Component Value Date   CHOL 200 (H) 08/15/2019   HDL 69 08/15/2019   LDLCALC 115 (H) 08/15/2019   TRIG 67 08/15/2019   CHOLHDL 2.9 08/15/2019    She has been working on diet and exercise for glucose management, and denies increased appetite, paresthesia of the feet, polydipsia, polyuria, visual disturbances and vomiting. Last A1C in the office was:  Lab Results  Component Value Date   HGBA1C 5.5 08/15/2019   Patient is on Vitamin D supplement.   Lab Results  Component Value Date   VD25OH 51 08/15/2019         Past Medical History:  Diagnosis Date  . Allergy    seasonal  . GERD (gastroesophageal reflux disease)   . Hyperlipidemia   . Labile hypertension   . Prediabetes   . Raynaud's disease   . Thyroid disease    hypothyroid  . Vitamin D deficiency      Allergies  Allergen Reactions  . Itraconazole   . Penicillins Hives  . Sulfa Antibiotics Hives    Current Outpatient Medications on File Prior to Visit  Medication Sig  . aspirin 81 MG tablet Take 81 mg by mouth daily.  . Cetirizine HCl (ZYRTEC ALLERGY PO) Take 10 mg by mouth at bedtime.  . Cholecalciferol (VITAMIN D) 2000 UNITS tablet Take 2,000 Units by mouth daily.   . Cinnamon 500 MG capsule Take 2,000 mg by mouth daily.  . Cyanocobalamin (VITAMIN B 12 PO) Take 1,000 mcg by mouth daily.  Marland Kitchen desoximetasone (TOPICORT) 0.25 % cream Apply 1 application topically 2 (two) times daily.  Marland Kitchen estradiol (ESTRACE) 1 MG tablet Take 1 tablet (1 mg total) by mouth daily.  Marland Kitchen gabapentin (NEURONTIN) 300 MG capsule TAKE 1 TO 2 CAPSULES 2 TO 3 TIMES DAILY AS NEEDED FOR HOT FLASHES.  Marland Kitchen  levothyroxine (SYNTHROID) 50 MCG tablet Take 1 tablet daily on an empty stomach with only water for 30 minutes & no Antacid meds, Calcium or Magnesium for 4 hours & avoid Biotin  . Magnesium 400 MG TABS Take 1 tablet by mouth 2 (two) times daily.  . meloxicam (MOBIC) 15 MG tablet Take 15 mg by mouth daily.  . Omega-3 Fatty Acids (OMEGA-3 FISH OIL) 1200 MG CAPS Take 2 capsules by mouth daily.  . pantoprazole (PROTONIX) 40 MG tablet Take 1 tablet Daily for Heartburn & Indigestion  . progesterone (PROMETRIUM) 100 MG capsule Take 1 capsule (100 mg total) by mouth at bedtime.  . Red Yeast Rice Extract (RED YEAST RICE PO) Take 2,400 mg by mouth daily.   Marland Kitchen terbinafine (LAMISIL) 250 MG tablet Take 1 tablet (250 mg total) by mouth daily.   No current facility-administered medications on file prior to visit.  ROS: Review of Systems  Constitutional: Negative for chills, diaphoresis, fever, malaise/fatigue and weight loss.  HENT: Negative for hearing loss and tinnitus.   Eyes: Negative for blurred vision and double vision.  Respiratory: Negative for cough, shortness of breath and wheezing.   Cardiovascular: Negative for chest pain, palpitations, orthopnea, claudication and leg swelling.  Gastrointestinal: Positive for nausea. Negative for abdominal pain, blood in stool, constipation, diarrhea, heartburn, melena and vomiting.  Genitourinary: Negative.   Musculoskeletal: Negative for joint pain and myalgias.  Skin: Negative for rash.  Neurological: Negative for dizziness, tingling, sensory change, weakness and headaches.  Endo/Heme/Allergies: Negative for polydipsia.  Psychiatric/Behavioral: Positive for depression. Negative for hallucinations, substance abuse and suicidal ideas. The patient is nervous/anxious. The patient does not have insomnia.   All other systems reviewed and are negative.    Physical Exam:  BP 120/70   Pulse 74   Temp (!) 97.3 F (36.3 C)   Wt 135 lb (61.2 kg)   LMP  03/02/2013   SpO2 92%   BMI 22.81 kg/m   General Appearance: Well nourished, in no apparent distress. Eyes: PERRLA, EOMs, conjunctiva no swelling or erythema Sinuses: No Frontal/maxillary tenderness ENT/Mouth: Ext aud canals clear, TMs without erythema, bulging. Mask in place; oral exam deferred. Hearing normal.  Neck: Supple, thyroid normal.  Respiratory: Respiratory effort normal, BS equal bilaterally without rales, rhonchi, wheezing or stridor.  Cardio: RRR with no MRGs. Brisk peripheral pulses without edema.  Abdomen: Soft, + BS.  Non tender, no guarding, rebound, hernias, masses. Lymphatics: Non tender without lymphadenopathy.  Musculoskeletal: Full ROM, 5/5 strength, normal gait.  Skin: Warm, dry without rashes, lesions, ecchymosis.  Neuro: Cranial nerves intact. Normal muscle tone, no cerebellar symptoms. Sensation intact.  Psych: Awake and oriented X 3, depressed/tearful affect, Insight and Judgment appropriate.     Izora Ribas, NP 3:45 PM [facility]

## 2020-02-08 LAB — COMPLETE METABOLIC PANEL WITH GFR
AG Ratio: 1.6 (calc) (ref 1.0–2.5)
ALT: 9 U/L (ref 6–29)
AST: 15 U/L (ref 10–35)
Albumin: 4.5 g/dL (ref 3.6–5.1)
Alkaline phosphatase (APISO): 37 U/L (ref 37–153)
BUN: 10 mg/dL (ref 7–25)
CO2: 30 mmol/L (ref 20–32)
Calcium: 10 mg/dL (ref 8.6–10.4)
Chloride: 102 mmol/L (ref 98–110)
Creat: 0.79 mg/dL (ref 0.50–1.05)
GFR, Est African American: 96 mL/min/{1.73_m2} (ref >=60)
GFR, Est Non African American: 83 mL/min/{1.73_m2} (ref >=60)
Globulin: 2.9 g/dL (calc) (ref 1.9–3.7)
Glucose, Bld: 78 mg/dL (ref 65–99)
Potassium: 4.5 mmol/L (ref 3.5–5.3)
Sodium: 141 mmol/L (ref 135–146)
Total Bilirubin: 0.5 mg/dL (ref 0.2–1.2)
Total Protein: 7.4 g/dL (ref 6.1–8.1)

## 2020-02-08 LAB — MAGNESIUM: Magnesium: 2.4 mg/dL (ref 1.5–2.5)

## 2020-02-08 LAB — CBC WITH DIFFERENTIAL/PLATELET
Absolute Monocytes: 420 cells/uL (ref 200–950)
Basophils Absolute: 29 cells/uL (ref 0–200)
Basophils Relative: 0.7 %
Eosinophils Absolute: 59 cells/uL (ref 15–500)
Eosinophils Relative: 1.4 %
HCT: 42.4 % (ref 35.0–45.0)
Hemoglobin: 14 g/dL (ref 11.7–15.5)
Lymphs Abs: 1399 cells/uL (ref 850–3900)
MCH: 28.9 pg (ref 27.0–33.0)
MCHC: 33 g/dL (ref 32.0–36.0)
MCV: 87.6 fL (ref 80.0–100.0)
MPV: 10.6 fL (ref 7.5–12.5)
Monocytes Relative: 10 %
Neutro Abs: 2293 cells/uL (ref 1500–7800)
Neutrophils Relative %: 54.6 %
Platelets: 228 10*3/uL (ref 140–400)
RBC: 4.84 10*6/uL (ref 3.80–5.10)
RDW: 12.2 % (ref 11.0–15.0)
Total Lymphocyte: 33.3 %
WBC: 4.2 10*3/uL (ref 3.8–10.8)

## 2020-02-08 LAB — URINALYSIS W MICROSCOPIC + REFLEX CULTURE
Bacteria, UA: NONE SEEN /HPF
Bilirubin Urine: NEGATIVE
Glucose, UA: NEGATIVE
Hgb urine dipstick: NEGATIVE
Hyaline Cast: NONE SEEN /LPF
Ketones, ur: NEGATIVE
Leukocyte Esterase: NEGATIVE
Nitrites, Initial: NEGATIVE
Protein, ur: NEGATIVE
RBC / HPF: NONE SEEN /HPF (ref 0–2)
Specific Gravity, Urine: 1.004 (ref 1.001–1.03)
Squamous Epithelial / HPF: NONE SEEN /HPF (ref <=5)
WBC, UA: NONE SEEN /HPF (ref 0–5)
pH: 7 (ref 5.0–8.0)

## 2020-02-08 LAB — LIPID PANEL
Cholesterol: 199 mg/dL (ref <200)
HDL: 63 mg/dL (ref >=50)
LDL Cholesterol (Calc): 117 mg/dL (calc) — ABNORMAL HIGH
Non-HDL Cholesterol (Calc): 136 mg/dL (calc) — ABNORMAL HIGH (ref <130)
Total CHOL/HDL Ratio: 3.2 (calc) (ref <5.0)
Triglycerides: 93 mg/dL (ref <150)

## 2020-02-08 LAB — NO CULTURE INDICATED

## 2020-02-08 LAB — TSH: TSH: 2.15 mIU/L (ref 0.40–4.50)

## 2020-02-15 ENCOUNTER — Ambulatory Visit: Payer: BC Managed Care – PPO | Admitting: Internal Medicine

## 2020-03-16 ENCOUNTER — Other Ambulatory Visit: Payer: Self-pay | Admitting: Adult Health

## 2020-03-16 DIAGNOSIS — F321 Major depressive disorder, single episode, moderate: Secondary | ICD-10-CM

## 2020-04-02 NOTE — Progress Notes (Signed)
Assessment and Plan:  Major depressive disorder, single episode, moderate with anxious distress ([place]) Much improved since Mother passed in hospice this past weekend, reports positive experience at the end and at peace  She feels lexapro has been particularly beneficial, sill continue through grief transition period, consider taper later this summer or next summer when doing well Taper to d/c buspar bridge She was given grief counseling resources by hospice, will reach out to do this with father Will give short term script for xanax for sleep, 0.25-0.5 mg BID PRN Stress management techniques discussed, increase water, good sleep hygiene discussed, increase exercise, and increase veggies. Follow up as needed/scheduled.    Further disposition pending results of labs. Discussed med's effects and SE's.   Over 30 minutes of exam, counseling, chart review, and critical decision making was performed.   Future Appointments  Date Time Provider [place]  04/12/2020  8:45 AM [place], Bennie Pierini T, [place] [facility]  05/11/2020  2:00 PM Princess Bruins, MD [facility]  08/30/2020  2:00 PM Unk Pinto, MD [name]    ------------------------------------------------------------------------------------------------------------------   HPI BP 118/72   Pulse 64   Temp 97.7 F (36.5 C)   Ht 5' 4.5" (1.638 m)   Wt 130 lb (59 kg)   LMP 03/02/2013   SpO2 97%   BMI 21.97 kg/m   59 y.o.female presents for depression/anxiety follow up after new start lexapro on 02/07/2020  She reported had been very stressed with bad weather, both parents with declining in health, has been really struggling. Reported crying more easily, worrying constantly, not sleeping well at night. She feels stress is cause of nausea/GI sx.   Mother passed away last week after brief transition to hospice at home for 6 weeks last Friday.   She has been taking lexapro 20 mg daily, taking buspar 5 mg BID, has been  sleeping well, admits has been "borrowing" husband's xanax. Would like a short   Past Medical History:  Diagnosis Date  . Allergy    seasonal  . GERD (gastroesophageal reflux disease)   . Hyperlipidemia   . Labile hypertension   . Prediabetes   . Raynaud's disease   . Thyroid disease    hypothyroid  . Vitamin D deficiency      Allergies  Allergen Reactions  . Itraconazole   . Penicillins Hives  . Sulfa Antibiotics Hives    Current Outpatient Medications on File Prior to Visit  Medication Sig  . aspirin 81 MG tablet Take 81 mg by mouth daily.  . Cetirizine HCl (ZYRTEC ALLERGY PO) Take 10 mg by mouth at bedtime.  . Cholecalciferol (VITAMIN D) 2000 UNITS tablet Take 2,000 Units by mouth daily.   . Cinnamon 500 MG capsule Take 2,000 mg by mouth daily.  . Cyanocobalamin (VITAMIN B 12 PO) Take 1,000 mcg by mouth daily.  Marland Kitchen desoximetasone (TOPICORT) 0.25 % cream Apply 1 application topically 2 (two) times daily.  Marland Kitchen escitalopram (LEXAPRO) 20 MG tablet Take 1 tablet (20 mg total) by mouth daily.  Marland Kitchen estradiol (ESTRACE) 1 MG tablet Take 1 tablet (1 mg total) by mouth daily.  Marland Kitchen gabapentin (NEURONTIN) 300 MG capsule TAKE 1 TO 2 CAPSULES 2 TO 3 TIMES DAILY AS NEEDED FOR HOT FLASHES.  Marland Kitchen levothyroxine (SYNTHROID) 50 MCG tablet Take 1 tablet daily on an empty stomach with only water for 30 minutes & no Antacid meds, Calcium or Magnesium for 4 hours & avoid Biotin  . Magnesium 400 MG TABS Take 1 tablet by  mouth 2 (two) times daily.  . meloxicam (MOBIC) 15 MG tablet Take 15 mg by mouth as needed.   . Omega-3 Fatty Acids (OMEGA-3 FISH OIL) 1200 MG CAPS Take 2 capsules by mouth daily.  . ondansetron (ZOFRAN) 4 MG tablet Take 1 tablet (4 mg total) by mouth every 8 (eight) hours as needed for nausea or vomiting.  . pantoprazole (PROTONIX) 40 MG tablet Take 1 tablet Daily for Heartburn & Indigestion  . progesterone (PROMETRIUM) 100 MG capsule Take 1 capsule (100 mg total) by mouth at bedtime.  . Red  Yeast Rice Extract (RED YEAST RICE PO) Take 2,400 mg by mouth daily.   Marland Kitchen terbinafine (LAMISIL) 250 MG tablet Take 1 tablet (250 mg total) by mouth daily. (Patient not taking: Reported on 04/03/2020)   No current facility-administered medications on file prior to visit.    ROS: all negative except above.   Physical Exam:  BP 118/72   Pulse 64   Temp 97.7 F (36.5 C)   Ht 5' 4.5" (1.638 m)   Wt 130 lb (59 kg)   LMP 03/02/2013   SpO2 97%   BMI 21.97 kg/m   General Appearance: Well nourished, in no apparent distress. Eyes: PERRLA,  conjunctiva no swelling or erythema ENT/Mouth: Mask in place; Hearing normal.  Neck: Supple, thyroid normal.  Respiratory: Respiratory effort normal, BS equal bilaterally without rales, rhonchi, wheezing or stridor.  Cardio: RRR with no MRGs. Brisk peripheral pulses without edema.  Abdomen: Soft, + BS.  Non tender Lymphatics: Non tender without lymphadenopathy.  Musculoskeletal: normal gait.  Skin: Warm, dry without rashes, lesions, ecchymosis.  Neuro: Normal muscle tone Psych: Awake and oriented X 3, normal affect, Insight and Judgment appropriate.     Izora Ribas, NP 3:00 PM [facility]

## 2020-04-03 ENCOUNTER — Other Ambulatory Visit: Payer: Self-pay

## 2020-04-03 ENCOUNTER — Encounter: Payer: Self-pay | Admitting: Adult Health

## 2020-04-03 ENCOUNTER — Ambulatory Visit: Payer: BC Managed Care – PPO | Admitting: Adult Health

## 2020-04-03 VITALS — BP 118/72 | HR 64 | Temp 97.7°F | Ht 64.5 in | Wt 130.0 lb

## 2020-04-03 DIAGNOSIS — F321 Major depressive disorder, single episode, moderate: Secondary | ICD-10-CM

## 2020-04-03 DIAGNOSIS — F4321 Adjustment disorder with depressed mood: Secondary | ICD-10-CM | POA: Diagnosis not present

## 2020-04-03 MED ORDER — ALPRAZOLAM 0.5 MG PO TABS: 0.2500 mg | ORAL_TABLET | Freq: Two times a day (BID) | ORAL | 0 refills | Status: AC | PRN

## 2020-04-03 NOTE — Patient Instructions (Addendum)
Taper down on buspar - 1/2 tab twice a day for 1-2 weeks then stop     Alprazolam tablets What is this medicine? ALPRAZOLAM (al PRAY zoe lam) is a benzodiazepine. It is used to treat anxiety and panic attacks. This medicine may be used for other purposes; ask your health care provider or pharmacist if you have questions. COMMON BRAND NAME(S): Xanax What should I tell my health care provider before I take this medicine? They need to know if you have any of these conditions:  an alcohol or drug abuse problem  bipolar disorder, depression, psychosis or other mental health conditions  glaucoma  kidney or liver disease  lung or breathing disease  myasthenia gravis  Parkinson's disease  porphyria  seizures or a history of seizures  suicidal thoughts  an unusual or allergic reaction to alprazolam, other benzodiazepines, foods, dyes, or preservatives  pregnant or trying to get pregnant  breast-feeding How should I use this medicine? Take this medicine by mouth with a glass of water. Follow the directions on the prescription label. Take your medicine at regular intervals. Do not take it more often than directed. Do not stop taking except on your doctor's advice. A special MedGuide will be given to you by the pharmacist with each prescription and refill. Be sure to read this information carefully each time. Talk to your pediatrician regarding the use of this medicine in children. Special care may be needed. Overdosage: If you think you have taken too much of this medicine contact a poison control center or emergency room at once. NOTE: This medicine is only for you. Do not share this medicine with others. What if I miss a dose? If you miss a dose, take it as soon as you can. If it is almost time for your next dose, take only that dose. Do not take double or extra doses. What may interact with this medicine? Do not take this medicine with any of the following  medications:  certain antiviral medicines for HIV or AIDS like delavirdine, indinavir  certain medicines for fungal infections like ketoconazole and itraconazole  narcotic medicines for cough  sodium oxybate This medicine may also interact with the following medications:  alcohol  antihistamines for allergy, cough and cold  certain antibiotics like clarithromycin, erythromycin, isoniazid, rifampin, rifapentine, rifabutin, and troleandomycin  certain medicines for blood pressure, heart disease, irregular heart beat  certain medicines for depression, like amitriptyline, fluoxetine, sertraline  certain medicines for seizures like carbamazepine, oxcarbazepine, phenobarbital, phenytoin, primidone  cimetidine  cyclosporine  female hormones, like estrogens or progestins and birth control pills, patches, rings, or injections  general anesthetics like halothane, isoflurane, methoxyflurane, propofol  grapefruit juice  local anesthetics like lidocaine, pramoxine, tetracaine  medicines that relax muscles for surgery  narcotic medicines for pain  other antiviral medicines for HIV or AIDS  phenothiazines like chlorpromazine, mesoridazine, prochlorperazine, thioridazine This list may not describe all possible interactions. Give your health care provider a list of all the medicines, herbs, non-prescription drugs, or dietary supplements you use. Also tell them if you smoke, drink alcohol, or use illegal drugs. Some items may interact with your medicine. What should I watch for while using this medicine? Tell your doctor or health care professional if your symptoms do not start to get better or if they get worse. Do not stop taking except on your doctor's advice. You may develop a severe reaction. Your doctor will tell you how much medicine to take. You may get drowsy or  dizzy. Do not drive, use machinery, or do anything that needs mental alertness until you know how this medicine affects  you. To reduce the risk of dizzy and fainting spells, do not stand or sit up quickly, especially if you are an older patient. Alcohol may increase dizziness and drowsiness. Avoid alcoholic drinks. If you are taking another medicine that also causes drowsiness, you may have more side effects. Give your health care provider a list of all medicines you use. Your doctor will tell you how much medicine to take. Do not take more medicine than directed. Call emergency for help if you have problems breathing or unusual sleepiness. What side effects may I notice from receiving this medicine? Side effects that you should report to your doctor or health care professional as soon as possible:  allergic reactions like skin rash, itching or hives, swelling of the face, lips, or tongue  breathing problems  confusion  loss of balance or coordination  signs and symptoms of low blood pressure like dizziness; feeling faint or lightheaded, falls; unusually weak or tired  suicidal thoughts or other mood changes Side effects that usually do not require medical attention (report to your doctor or health care professional if they continue or are bothersome):  dizziness  dry mouth  nausea, vomiting  tiredness This list may not describe all possible side effects. Call your doctor for medical advice about side effects. You may report side effects to FDA at 1-800-FDA-1088. Where should I keep my medicine? Keep out of the reach of children. This medicine can be abused. Keep your medicine in a safe place to protect it from theft. Do not share this medicine with anyone. Selling or giving away this medicine is dangerous and against the law. Store at room temperature between 20 and 25 degrees C (68 and 77 degrees F). This medicine may cause accidental overdose and death if taken by other adults, children, or pets. Mix any unused medicine with a substance like cat litter or coffee grounds. Then throw the medicine away in  a sealed container like a sealed bag or a coffee can with a lid. Do not use the medicine after the expiration date. NOTE: This sheet is a summary. It may not cover all possible information. If you have questions about this medicine, talk to your doctor, pharmacist, or health care provider.  2020 Elsevier/Gold Standard (2015-08-30 13:47:25)

## 2020-04-12 ENCOUNTER — Ambulatory Visit: Payer: BC Managed Care – PPO | Admitting: Podiatry

## 2020-04-12 ENCOUNTER — Other Ambulatory Visit: Payer: Self-pay

## 2020-04-12 DIAGNOSIS — M7752 Other enthesopathy of left foot: Secondary | ICD-10-CM | POA: Diagnosis not present

## 2020-04-12 DIAGNOSIS — L603 Nail dystrophy: Secondary | ICD-10-CM

## 2020-04-12 DIAGNOSIS — Z79899 Other long term (current) drug therapy: Secondary | ICD-10-CM

## 2020-04-12 NOTE — Progress Notes (Signed)
She presents today has completed her Lamisil therapy and states that really cannot tell any difference.  She denies any problems taking the medicine.  She also has pain on palpation of the fifth metatarsal area left foot.  Objective: Vital signs are stable she is alert and oriented x3.  Toenails really look currently unchanged at this point in time.  I do think that more nail dystrophy than onychomycosis at this point.  Pain on palpation of the fifth metatarsal.  There is a small boggy area consistent with bursitis.  Assessment: Onychodystrophy onychomycosis.  Bursitis fifth metatarsal left.  Plan: At this point she would like to consider scheduling with Caryl Pina just for an attempt at laser if this does not work and she knows that there is nothing else we can do.  Injected 2 mg of dexamethasone local anesthetic to the point of maximal tenderness.  I will follow-up with her once Caryl Pina has completed her laser therapy.

## 2020-04-16 ENCOUNTER — Encounter: Payer: Self-pay | Admitting: Adult Health

## 2020-04-16 ENCOUNTER — Other Ambulatory Visit: Payer: Self-pay

## 2020-04-16 ENCOUNTER — Ambulatory Visit: Payer: BC Managed Care – PPO | Admitting: Adult Health

## 2020-04-16 VITALS — BP 118/74 | HR 68 | Temp 97.3°F | Wt 133.0 lb

## 2020-04-16 DIAGNOSIS — H00022 Hordeolum internum right lower eyelid: Secondary | ICD-10-CM | POA: Diagnosis not present

## 2020-04-16 MED ORDER — NEOMYCIN-POLYMYXIN-DEXAMETH 3.5-10000-0.1 OP SUSP: 1.0000 [drp] | Freq: Four times a day (QID) | OPHTHALMIC | 0 refills | Status: DC

## 2020-04-16 NOTE — Progress Notes (Signed)
Assessment and Plan:  Beth Miranda was seen today for eye problem.  Diagnoses and all orders for this visit:  Hordeolum internum of right lower eyelid Advised increase warm compresses 3-4 times a day, gently clean eyelids with baby shampoo on qtip or wash cloth and massage lid Avoid wearing eye makeup and switch to new once resolved Start maxitrol drops if no improvement in 2-3 days with increased compresses and q tip massaging or with worsening sx Ophthalmology for any vision changes or if persistent longer than 2 weeks Follow up as needed -     neomycin-polymyxin b-dexamethasone (MAXITROL) 3.5-10000-0.1 SUSP; Place 1 drop into the right eye 4 (four) times daily. For 1 week.  Further disposition pending results of labs. Discussed med's effects and SE's.   Over 15 minutes of exam, counseling, chart review, and critical decision making was performed.   Future Appointments  Date Time Provider [place]  04/16/2020 11:00 AM Liane Comber, NP [name]  04/30/2020 10:30 AM [facility] NURSE [facility]  05/11/2020  2:00 PM Princess Bruins, MD [facility]  07/12/2020  8:45 AM Hyatt, Max T, DPM [facility]  08/30/2020  2:00 PM Unk Pinto, MD [name]    ------------------------------------------------------------------------------------------------------------------   HPI BP 118/74   Pulse 68   Temp (!) 97.3 F (36.3 C)   Wt 133 lb (60.3 kg)   LMP 03/02/2013   SpO2 99%   BMI 22.48 kg/m   59 y.o.female presents for evaluation of inner R lower lid swelling and tenderness which she noted 4/29, became much more swollen and tender the following day, has been applying warm compresses once or twice daily which have been helping, massaging with Q tip, noted area "ruptured" on Saturday, has noted some green drainage since with reduced tenderness/pain. She reports new area to lower lateral area seems to also becoming more swollen and tender.   Denies vision  changes other than brief intermittent blurriness which clears when she irrigates eyes. Denies URI sx. Endorses some crusting in AM. Endorses allergies (takes cetirizine). Believes this started after she was vigorously rubbing itchy eyes. She does not wear contacts, hasn't been wearing eye makeup.   Past Medical History:  Diagnosis Date  . Allergy    seasonal  . GERD (gastroesophageal reflux disease)   . Hyperlipidemia   . Labile hypertension   . Prediabetes   . Raynaud's disease   . Thyroid disease    hypothyroid  . Vitamin D deficiency      Allergies  Allergen Reactions  . Itraconazole   . Penicillins Hives  . Sulfa Antibiotics Hives    Current Outpatient Medications on File Prior to Visit  Medication Sig  . ALPRAZolam (XANAX) 0.5 MG tablet Take 0.5-1 tablets (0.25-0.5 mg total) by mouth 2 (two) times daily as needed for anxiety or sleep.  Marland Kitchen aspirin 81 MG tablet Take 81 mg by mouth daily.  . Cetirizine HCl (ZYRTEC ALLERGY PO) Take 10 mg by mouth at bedtime.  . Cholecalciferol (VITAMIN D) 2000 UNITS tablet Take 2,000 Units by mouth daily.   . Cinnamon 500 MG capsule Take 2,000 mg by mouth daily.  . Cyanocobalamin (VITAMIN B 12 PO) Take 1,000 mcg by mouth daily.  Marland Kitchen escitalopram (LEXAPRO) 20 MG tablet Take 1 tablet (20 mg total) by mouth daily.  Marland Kitchen estradiol (ESTRACE) 1 MG tablet Take 1 tablet (1 mg total) by mouth daily.  Marland Kitchen gabapentin (NEURONTIN) 300 MG capsule TAKE 1 TO 2 CAPSULES 2 TO 3 TIMES DAILY AS NEEDED  FOR HOT FLASHES.  Marland Kitchen levothyroxine (SYNTHROID) 50 MCG tablet Take 1 tablet daily on an empty stomach with only water for 30 minutes & no Antacid meds, Calcium or Magnesium for 4 hours & avoid Biotin  . Magnesium 400 MG TABS Take 1 tablet by mouth 2 (two) times daily.  . Omega-3 Fatty Acids (OMEGA-3 FISH OIL) 1200 MG CAPS Take 2 capsules by mouth daily.  . ondansetron (ZOFRAN) 4 MG tablet Take 1 tablet (4 mg total) by mouth every 8 (eight) hours as needed for nausea or  vomiting.  . pantoprazole (PROTONIX) 40 MG tablet Take 1 tablet Daily for Heartburn & Indigestion  . progesterone (PROMETRIUM) 100 MG capsule Take 1 capsule (100 mg total) by mouth at bedtime.  . Red Yeast Rice Extract (RED YEAST RICE PO) Take 2,400 mg by mouth daily.   Marland Kitchen desoximetasone (TOPICORT) 0.25 % cream Apply 1 application topically 2 (two) times daily.  . meloxicam (MOBIC) 15 MG tablet Take 15 mg by mouth as needed.    No current facility-administered medications on file prior to visit.    ROS: all negative except above.   Physical Exam:  BP 118/74   Pulse 68   Temp (!) 97.3 F (36.3 C)   Wt 133 lb (60.3 kg)   LMP 03/02/2013   SpO2 99%   BMI 22.48 kg/m   General Appearance: Well nourished, in no apparent distress. Eyes: PERRLA, EOMs, conjunctiva no swelling or erythema. R lower inner lid with erythematous/swollen area, corresponding white patch to inner membrane, tender. No discharge present at time of exam. No foreign body. Some erythema to R lower outer lid.  Sinuses: No Frontal/maxillary tenderness ENT/Mouth:  No erythema, swelling, or exudate on post pharynx.  Tonsils not swollen or erythematous. Hearing normal.  Neck: Supple  Respiratory: Respiratory effort normal Lymphatics: Non tender without lymphadenopathy.  Musculoskeletal: No obvious deformity, normal gait.  Skin: Warm, dry without rashes, lesions, ecchymosis. Other than above comment in ENT.  Neuro: Normal muscle tone Psych: Awake and oriented X 3, normal affect, Insight and Judgment appropriate.     Izora Ribas, NP 10:59 AM [facility]

## 2020-04-16 NOTE — Patient Instructions (Signed)

## 2020-04-21 ENCOUNTER — Other Ambulatory Visit: Payer: Self-pay | Admitting: Internal Medicine

## 2020-04-21 ENCOUNTER — Other Ambulatory Visit: Payer: Self-pay | Admitting: Obstetrics & Gynecology

## 2020-04-23 NOTE — Telephone Encounter (Signed)
Annual exam scheduled on 05/11/20

## 2020-04-30 ENCOUNTER — Other Ambulatory Visit: Payer: Self-pay

## 2020-04-30 ENCOUNTER — Ambulatory Visit (INDEPENDENT_AMBULATORY_CARE_PROVIDER_SITE_OTHER): Payer: Self-pay | Admitting: *Deleted

## 2020-04-30 DIAGNOSIS — B351 Tinea unguium: Secondary | ICD-10-CM

## 2020-04-30 DIAGNOSIS — L603 Nail dystrophy: Secondary | ICD-10-CM

## 2020-04-30 NOTE — Patient Instructions (Signed)

## 2020-04-30 NOTE — Progress Notes (Signed)
Patient presents today for the 1st laser treatment. Diagnosed with mycotic nail infection by Dr. Milinda Pointer.   Toenail most affected is the hallux left, with some slight discoloration on the hallux right.  All other systems are negative.  Nails were filed thin. Laser therapy was administered to 1st toenails bilateral and patient tolerated the treatment well. All safety precautions were in place.   Patient has already taken a round of terbinafine, started on 04-28-19 and completed 04-12-20, with no improvements.  Follow up in 4 weeks for laser # 2.  Picture of nails taken today to document visual progress  ~Check 4th toenail left next visit-possible superficial fungus on that nail~

## 2020-05-05 ENCOUNTER — Other Ambulatory Visit: Payer: Self-pay | Admitting: Adult Health

## 2020-05-05 DIAGNOSIS — F321 Major depressive disorder, single episode, moderate: Secondary | ICD-10-CM

## 2020-05-10 ENCOUNTER — Other Ambulatory Visit: Payer: Self-pay

## 2020-05-11 ENCOUNTER — Ambulatory Visit (INDEPENDENT_AMBULATORY_CARE_PROVIDER_SITE_OTHER): Payer: BC Managed Care – PPO | Admitting: Obstetrics & Gynecology

## 2020-05-11 ENCOUNTER — Encounter: Payer: Self-pay | Admitting: Obstetrics & Gynecology

## 2020-05-11 VITALS — BP 134/80 | Wt 137.0 lb

## 2020-05-11 DIAGNOSIS — Z01419 Encounter for gynecological examination (general) (routine) without abnormal findings: Secondary | ICD-10-CM | POA: Diagnosis not present

## 2020-05-11 DIAGNOSIS — M8589 Other specified disorders of bone density and structure, multiple sites: Secondary | ICD-10-CM

## 2020-05-11 DIAGNOSIS — Z79890 Hormone replacement therapy (postmenopausal): Secondary | ICD-10-CM

## 2020-05-11 MED ORDER — PROGESTERONE MICRONIZED 100 MG PO CAPS: 100.0000 mg | ORAL_CAPSULE | Freq: Every day | ORAL | 4 refills | Status: DC

## 2020-05-11 MED ORDER — ESTRADIOL 1 MG PO TABS: 1.0000 mg | ORAL_TABLET | Freq: Every day | ORAL | 4 refills | Status: DC

## 2020-05-11 NOTE — Progress Notes (Signed)
Beth Miranda October 06, 1961 [id number]   History:    59 y.o. 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  Grieving process.  Postmenopause, well on HRT x 8 yrs, Estradiol 1 mg PO daily HS last year and Prometrium 100 mg PO daily HS.  Rare hot flushes.  No PMB.  No pelvic pain. Abstinent.  Breasts normal.  Urine/BMs normal.  BMI 23.15.  Good nutrition.  Not exercising regularly.  Health labs with Fam MD.   Past medical history,surgical history, family history and social history were all reviewed and documented in the EPIC chart.  Gynecologic History Patient's last menstrual period was 03/02/2013.  Obstetric History OB History  Gravida Para Term Preterm AB Living  0 0 0 0 0 0  SAB TAB Ectopic Multiple Live Births  0 0 0 0       ROS: A ROS was performed and pertinent positives and negatives are included in the history.  GENERAL: No fevers or chills. HEENT: No change in vision, no earache, sore throat or sinus congestion. NECK: No pain or stiffness. CARDIOVASCULAR: No chest pain or pressure. No palpitations. PULMONARY: No shortness of breath, cough or wheeze. GASTROINTESTINAL: No abdominal pain, nausea, vomiting or diarrhea, melena or bright red blood per rectum. GENITOURINARY: No urinary frequency, urgency, hesitancy or dysuria. MUSCULOSKELETAL: No joint or muscle pain, no back pain, no recent trauma. DERMATOLOGIC: No rash, no itching, no lesions. ENDOCRINE: No polyuria, polydipsia, no heat or cold intolerance. No recent change in weight. HEMATOLOGICAL: No anemia or easy bruising or bleeding. NEUROLOGIC: No headache, seizures, numbness, tingling or weakness. PSYCHIATRIC: No depression, no loss of interest in normal activity or change in sleep pattern.     Exam:   BP 134/80   Wt 137 lb (62.1 kg)   LMP 03/02/2013   BMI 23.15 kg/m   Body mass index is 23.15 kg/m.  General appearance : Well developed well  nourished female. No acute distress HEENT: Eyes: no retinal hemorrhage or exudates,  Neck supple, trachea midline, no carotid bruits, no thyroidmegaly Lungs: Clear to auscultation, no rhonchi or wheezes, or rib retractions  Heart: Regular rate and rhythm, no murmurs or gallops Breast:Examined in sitting and supine position were symmetrical in appearance, no palpable masses or tenderness,  no skin retraction, no nipple inversion, no nipple discharge, no skin discoloration, no axillary or supraclavicular lymphadenopathy Abdomen: no palpable masses or tenderness, no rebound or guarding Extremities: no edema or skin discoloration or tenderness  Pelvic: Vulva: Normal             Vagina: No gross lesions or discharge  Cervix: No gross lesions or discharge  Uterus  AV, normal size, shape and consistency, non-tender and mobile  Adnexa  Without masses or tenderness  Anus: Normal   Assessment/Plan:  58 y.o. female for annual exam   1. Well female exam with routine gynecological exam Normal gynecologic exam in menopause.  Pap test normal May 2020, no indication to repeat a Pap test this year.  Breast exam normal.  Screening mammogram September 2020 was negative.  Colonoscopy 2013.  Health labs with family physician.  Good body mass index at 23.15.  Continue with healthy nutrition.  Improve fitness.  2. Postmenopausal hormone replacement therapy Well on the hormone replacement therapy for 8 years.  No contraindication to continue.  We will continue with estradiol 1 mg per mouth daily and Prometrium 100  mg per mouth daily at bedtime.  Prescriptions sent to pharmacy.  3. Osteopenia of multiple sites We will schedule a bone density here now.  Vitamin D supplements, calcium intake of 1200 mg daily and regular weightbearing physical activities recommended. - DG Bone Density; Future  Other orders - estradiol (ESTRACE) 1 MG tablet; Take 1 tablet (1 mg total) by mouth daily. - progesterone (PROMETRIUM) 100  MG capsule; Take 1 capsule (100 mg total) by mouth at bedtime.  Princess Bruins MD, 1:59 PM 05/11/2020

## 2020-05-11 NOTE — Patient Instructions (Signed)
1. Well female exam with routine gynecological exam Normal gynecologic exam in menopause.  Pap test normal May 2020, no indication to repeat a Pap test this year.  Breast exam normal.  Screening mammogram September 2020 was negative.  Colonoscopy 2013.  Health labs with family physician.  Good body mass index at 23.15.  Continue with healthy nutrition.  Improve fitness.  2. Postmenopausal hormone replacement therapy Well on the hormone replacement therapy for 8 years.  No contraindication to continue.  We will continue with estradiol 1 mg per mouth daily and Prometrium 100 mg per mouth daily at bedtime.  Prescriptions sent to pharmacy.  3. Osteopenia of multiple sites We will schedule a bone density here now.  Vitamin D supplements, calcium intake of 1200 mg daily and regular weightbearing physical activities recommended. - DG Bone Density; Future  Other orders - estradiol (ESTRACE) 1 MG tablet; Take 1 tablet (1 mg total) by mouth daily. - progesterone (PROMETRIUM) 100 MG capsule; Take 1 capsule (100 mg total) by mouth at bedtime.  Jan, it was a pleasure seeing you today!

## 2020-05-21 ENCOUNTER — Other Ambulatory Visit: Payer: Self-pay

## 2020-05-22 ENCOUNTER — Other Ambulatory Visit: Payer: Self-pay | Admitting: Obstetrics & Gynecology

## 2020-05-22 ENCOUNTER — Ambulatory Visit (INDEPENDENT_AMBULATORY_CARE_PROVIDER_SITE_OTHER): Payer: BC Managed Care – PPO

## 2020-05-22 DIAGNOSIS — Z780 Asymptomatic menopausal state: Secondary | ICD-10-CM

## 2020-05-22 DIAGNOSIS — M8589 Other specified disorders of bone density and structure, multiple sites: Secondary | ICD-10-CM

## 2020-05-28 ENCOUNTER — Ambulatory Visit (INDEPENDENT_AMBULATORY_CARE_PROVIDER_SITE_OTHER): Payer: BC Managed Care – PPO | Admitting: *Deleted

## 2020-05-28 ENCOUNTER — Other Ambulatory Visit: Payer: Self-pay

## 2020-05-28 DIAGNOSIS — B351 Tinea unguium: Secondary | ICD-10-CM

## 2020-05-28 DIAGNOSIS — L603 Nail dystrophy: Secondary | ICD-10-CM

## 2020-06-01 NOTE — Progress Notes (Addendum)
Patient presents today for the 2nd laser treatment. Diagnosed with mycotic nail infection by Dr. Milinda Pointer.   Toenail most affected is the hallux left, with some slight discoloration on the hallux right.  All other systems are negative.  Nails were filed thin. Laser therapy was administered to 1st toenails bilateral and patient tolerated the treatment well. All safety precautions were in place.   Patient has already taken a round of terbinafine, started on 04-28-19 and completed 04-12-20, with no improvements.  Follow up in 4 weeks for laser # 3. Lattie Haw performed her laser today.   ~Check 3rd toenail left next visit-possible superficial fungus on that nail~

## 2020-06-08 ENCOUNTER — Other Ambulatory Visit: Payer: Self-pay

## 2020-06-08 ENCOUNTER — Ambulatory Visit (INDEPENDENT_AMBULATORY_CARE_PROVIDER_SITE_OTHER): Payer: BC Managed Care – PPO | Admitting: *Deleted

## 2020-06-08 DIAGNOSIS — L603 Nail dystrophy: Secondary | ICD-10-CM

## 2020-07-12 ENCOUNTER — Telehealth: Payer: Self-pay | Admitting: *Deleted

## 2020-07-12 ENCOUNTER — Encounter: Payer: Self-pay | Admitting: Podiatry

## 2020-07-12 ENCOUNTER — Ambulatory Visit (INDEPENDENT_AMBULATORY_CARE_PROVIDER_SITE_OTHER): Payer: BC Managed Care – PPO | Admitting: *Deleted

## 2020-07-12 ENCOUNTER — Ambulatory Visit: Payer: BC Managed Care – PPO | Admitting: Podiatry

## 2020-07-12 ENCOUNTER — Other Ambulatory Visit: Payer: Self-pay

## 2020-07-12 DIAGNOSIS — M7752 Other enthesopathy of left foot: Secondary | ICD-10-CM

## 2020-07-12 DIAGNOSIS — L603 Nail dystrophy: Secondary | ICD-10-CM

## 2020-07-12 NOTE — Progress Notes (Signed)
She presents today for follow-up of a bursitis to the left fifth met base.  She states that the injection worked for about a week and then the tendon started hurting so badly and the bone started hurting so badly just cannot hardly stand it.  Objective: Vital signs are stable alert and oriented x3.  There is no erythema edema cellulitis drainage or odor there is tenderness on palpation of the fifth metatarsal base and on abduction against resistance at the insertion of the peroneus brevis.  The peroneus brevis also does demonstrate some fluctuance within or around the tendon itself.  The peroneus longus at the arcuate portion of the cuboid is also painful.  Assessment: Cannot rule out a stress reaction to the fifth met base but most likely a peroneal tendon problem cannot rule out a tear there.  She is also has onychomycosis and being treated by Caryl Pina with laser therapy.  Plan: She will follow up with Caryl Pina every 6 weeks now for laser.  It appears to be resolving some so she is happy about that.  We are going to request an MRI of her left foot particularly the fifth met base and the peroneals.  I will follow-up with her once those results come in.

## 2020-07-12 NOTE — Progress Notes (Signed)
Patient presents today for the 3rd laser treatment. Diagnosed with mycotic nail infection by Dr. Milinda Pointer.   Toenail most affected is the hallux left. The hallux nail right has completely cleared. The 3rd nail left does have some white spotty discoloration still.  All other systems are negative.  Nails were filed thin. Laser therapy was administered to 1st toenails bilateral and 3rd nail left patient tolerated the treatment well. All safety precautions were in place.   Patient has already taken a round of terbinafine, started on 04-28-19 and completed 04-12-20, with no improvements.  Follow up in 6 weeks for laser # 4.

## 2020-07-12 NOTE — Telephone Encounter (Signed)
Faxed orders, clinicals and demographics to [place] for scheduling and pre-cert.

## 2020-07-12 NOTE — Telephone Encounter (Signed)
-----  Message from Rip Harbour, [facility] sent at 07/12/2020  9:19 AM EDT ----- Regarding: MRI MRI foot/ankle left - evaluate peroneal tendon tear at insertion 5th met base left, possible 5th met base fracture left-surgical consideration   He thinks this will be an ankle MRI but make sure they know to follow all the way down to the 5th met base

## 2020-07-17 ENCOUNTER — Other Ambulatory Visit: Payer: Self-pay | Admitting: Internal Medicine

## 2020-07-17 DIAGNOSIS — K219 Gastro-esophageal reflux disease without esophagitis: Secondary | ICD-10-CM

## 2020-07-18 ENCOUNTER — Other Ambulatory Visit: Payer: Self-pay | Admitting: Internal Medicine

## 2020-08-03 ENCOUNTER — Other Ambulatory Visit: Payer: Self-pay | Admitting: Podiatry

## 2020-08-07 ENCOUNTER — Ambulatory Visit
Admission: RE | Admit: 2020-08-07 | Discharge: 2020-08-07 | Disposition: A | Discharge status: Home / Self Care | Payer: BC Managed Care – PPO | Source: Ambulatory Visit | Attending: Podiatry | Admitting: Podiatry

## 2020-08-07 ENCOUNTER — Other Ambulatory Visit: Payer: Self-pay

## 2020-08-21 ENCOUNTER — Other Ambulatory Visit: Payer: Self-pay

## 2020-08-21 ENCOUNTER — Ambulatory Visit: Payer: BC Managed Care – PPO | Admitting: Podiatry

## 2020-08-21 DIAGNOSIS — Q6672 Congenital pes cavus, left foot: Secondary | ICD-10-CM | POA: Diagnosis not present

## 2020-08-21 DIAGNOSIS — M7752 Other enthesopathy of left foot: Secondary | ICD-10-CM | POA: Diagnosis not present

## 2020-08-21 DIAGNOSIS — S86312A Strain of muscle(s) and tendon(s) of peroneal muscle group at lower leg level, left leg, initial encounter: Secondary | ICD-10-CM | POA: Diagnosis not present

## 2020-08-23 NOTE — Progress Notes (Signed)
  Subjective:  Patient ID: Beth Miranda, female    DOB: 05-Jul-1961,  MRN: [id number]  Chief Complaint  Patient presents with  . Foot Pain    Left - MRI results    59 y.o. female presents with the above complaint. History confirmed with patient.  She is here today for results of her MRI  Objective:  Physical Exam: warm, good capillary refill, no trophic changes or ulcerative lesions, normal DP and PT pulses and normal sensory exam. Left Foot: Pain palpation along the peroneal tendons distal to the fibula and along to the fifth metatarsal base insertion   Study Result  Narrative & Impression  CLINICAL DATA:  Pain at the base of the fifth metatarsal for the past 4-5 months. No injury.  EXAM: MRI OF THE LEFT ANKLE WITHOUT CONTRAST  TECHNIQUE: Multiplanar, multisequence MR imaging of the ankle was performed. No intravenous contrast was administered.  COMPARISON:  Left foot x-rays dated February 22, 2019.  FINDINGS: TENDONS  Peroneal: Peroneal longus tendon intact. Longitudinal split tear of the peroneal brevis tendon at and just inferior to the lateral malleolus.  Posteromedial: Posterior tibial tendon intact with small amount of fluid in the tendon sheath. Flexor hallucis longus tendon intact. Flexor digitorum longus tendon intact.  Anterior: Tibialis anterior tendon intact. Extensor hallucis longus tendon intact Extensor digitorum longus tendon intact.  Achilles:  Intact.  Plantar Fascia: Intact.  LIGAMENTS  Lateral: Anterior talofibular ligament intact. Calcaneofibular ligament intact. Posterior talofibular ligament intact. Anterior and posterior tibiofibular ligaments intact.  Medial: Deltoid ligament intact. Spring ligament intact.  CARTILAGE  Ankle Joint: No joint effusion. Normal ankle mortise. No chondral defect.  Subtalar Joints/Sinus Tarsi: Normal subtalar joints. Small posterior subtalar joint effusion. Normal sinus  tarsi.  Bones: No marrow signal abnormality.  No fracture or dislocation.  Soft Tissue: No soft tissue mass or fluid collection.  IMPRESSION: 1. Longitudinal split tear of the peroneal brevis tendon at and just inferior to the lateral malleolus. 2. Mild posterior tibial tenosynovitis.   Electronically Signed   By: Titus Dubin M.D.   On: 08/07/2020 11:10     Assessment:   1. Bursitis of left foot   2. Tear of peroneal tendon, left, initial encounter   3. Pes cavus of left foot      Plan:  Patient was evaluated and treated and all questions answered.  -She has a peroneus brevis split tear from the distal fibula to the level of the fifth metatarsal base.  On her MRI the signal is quite heterogenous and lost as it passes beyond the fibula.  I discussed with her treatment options including mobilization, physical therapy, PRP injections as well as surgical repair of the tear.  We also discussed how this is secondary to her pes cavus and that her risk of retearing the tendon is possible without bracing or surgical reconstruction of the cavus deformity due to overloading of the lateral column and lateral ligaments and tendons.  She will consider her options and return in the next couple weeks to discuss surgery with Dr. Milinda Pointer and plan with him.  Return in about 1 week (around 08/28/2020).

## 2020-08-24 ENCOUNTER — Other Ambulatory Visit: Payer: Self-pay

## 2020-08-24 ENCOUNTER — Ambulatory Visit (INDEPENDENT_AMBULATORY_CARE_PROVIDER_SITE_OTHER): Payer: BC Managed Care – PPO | Admitting: *Deleted

## 2020-08-24 DIAGNOSIS — L603 Nail dystrophy: Secondary | ICD-10-CM

## 2020-08-24 DIAGNOSIS — B351 Tinea unguium: Secondary | ICD-10-CM

## 2020-08-24 NOTE — Progress Notes (Signed)
Patient presents today for the 4th laser treatment. Diagnosed with mycotic nail infection by Dr. Milinda Pointer.   Toenail most affected is the hallux and 3rd nail left.  The right hallux is completely clear.  All other systems are negative.  Nails were filed thin. Laser therapy was administered to 1st and 3rd nail left patient tolerated the treatment well. All safety precautions were in place.   Patient has already taken a round of terbinafine, started on 04-28-19 and completed 04-12-20, with no improvements.  Follow up in 6 weeks for laser # 5.

## 2020-08-30 ENCOUNTER — Other Ambulatory Visit: Payer: Self-pay

## 2020-08-30 ENCOUNTER — Ambulatory Visit: Payer: BLUE CROSS/BLUE SHIELD | Admitting: Internal Medicine

## 2020-08-30 VITALS — BP 122/80 | HR 68 | Temp 96.7°F | Resp 16 | Ht 64.0 in | Wt 148.6 lb

## 2020-08-30 DIAGNOSIS — Z136 Encounter for screening for cardiovascular disorders: Secondary | ICD-10-CM

## 2020-08-30 DIAGNOSIS — Z131 Encounter for screening for diabetes mellitus: Secondary | ICD-10-CM

## 2020-08-30 DIAGNOSIS — Z111 Encounter for screening for respiratory tuberculosis: Secondary | ICD-10-CM

## 2020-08-30 DIAGNOSIS — Z1329 Encounter for screening for other suspected endocrine disorder: Secondary | ICD-10-CM | POA: Diagnosis not present

## 2020-08-30 DIAGNOSIS — Z79899 Other long term (current) drug therapy: Secondary | ICD-10-CM | POA: Diagnosis not present

## 2020-08-30 DIAGNOSIS — Z0001 Encounter for general adult medical examination with abnormal findings: Secondary | ICD-10-CM

## 2020-08-30 DIAGNOSIS — R0989 Other specified symptoms and signs involving the circulatory and respiratory systems: Secondary | ICD-10-CM | POA: Diagnosis not present

## 2020-08-30 DIAGNOSIS — Z13220 Encounter for screening for lipoid disorders: Secondary | ICD-10-CM

## 2020-08-30 DIAGNOSIS — Z1211 Encounter for screening for malignant neoplasm of colon: Secondary | ICD-10-CM

## 2020-08-30 DIAGNOSIS — Z1389 Encounter for screening for other disorder: Secondary | ICD-10-CM | POA: Diagnosis not present

## 2020-08-30 DIAGNOSIS — Z130 Encounter for screening for diseases of the blood and blood-forming organs and certain disorders involving the immune mechanism: Secondary | ICD-10-CM

## 2020-08-30 DIAGNOSIS — R7303 Prediabetes: Secondary | ICD-10-CM

## 2020-08-30 DIAGNOSIS — K219 Gastro-esophageal reflux disease without esophagitis: Secondary | ICD-10-CM

## 2020-08-30 DIAGNOSIS — E039 Hypothyroidism, unspecified: Secondary | ICD-10-CM

## 2020-08-30 DIAGNOSIS — Z0000 Encounter for general adult medical examination without abnormal findings: Secondary | ICD-10-CM

## 2020-08-30 DIAGNOSIS — Z8249 Family history of ischemic heart disease and other diseases of the circulatory system: Secondary | ICD-10-CM | POA: Diagnosis not present

## 2020-08-30 DIAGNOSIS — E782 Mixed hyperlipidemia: Secondary | ICD-10-CM

## 2020-08-30 DIAGNOSIS — E559 Vitamin D deficiency, unspecified: Secondary | ICD-10-CM

## 2020-08-30 DIAGNOSIS — R5383 Other fatigue: Secondary | ICD-10-CM

## 2020-08-30 DIAGNOSIS — R7309 Other abnormal glucose: Secondary | ICD-10-CM

## 2020-08-30 NOTE — Patient Instructions (Signed)

## 2020-08-30 NOTE — Progress Notes (Signed)
Annual Screening/Preventative Visit & Comprehensive Evaluation &  Examination      This very nice 59 y.o.  MWF presents for a Screening /Preventative Visit & comprehensive evaluation and management of multiple medical co-morbidities.  Patient has been followed for labile HTN, HLD, Prediabetes  and Vitamin D Deficiency. Patient's GeERD is controlled on her meds.           Patient has remote  hx/o sero(+) Mixed Connective Disease Syndromedating back to the 1980's &w/associated Raynauds' syndrome which has been in remission for many years      Labile HTN predates since 2000. Patient's BP has been controlled at home and patient denies any cardiac symptoms as chest pain, palpitations, shortness of breath, dizziness or ankle swelling. Today's BP is at goal - 122/80.       Patient's hyperlipidemia is controlled with diet and medications. Patient denies myalgias or other medication SE's. Last lipids were not at goal:  Lab Results  Component Value Date   CHOL 199 02/07/2020   HDL 63 02/07/2020   LDLCALC 117 (H) 02/07/2020   TRIG 93 02/07/2020   CHOLHDL 3.2 02/07/2020       Patient has hx/o prediabetes predating (A1c 6.0% /2012 and 5.9% /2016)and patient denies reactive hypoglycemic symptoms, visual blurring, diabetic polys or paresthesias. Last A1c was  Normal & at goal:  Lab Results  Component Value Date   HGBA1C 5.5 08/15/2019          Patient was dx'd Hypothyroid in 2010 & has been on replacement since.      Finally, patient has history of Vitamin D Deficiency and last Vitamin D was Not at goal (70-100):  Lab Results  Component Value Date   VD25OH 51 08/15/2019    Current Outpatient Medications on File Prior to Visit  Medication Sig  . ALPRAZolam (XANAX) 0.5 MG tablet Take 0.5-1 tablets (0.25-0.5 mg total) by mouth 2 (two) times daily as needed for anxiety or sleep.  Marland Kitchen aspirin 81 MG tablet Take 81 mg by mouth daily.  . Cetirizine HCl (ZYRTEC ALLERGY PO) Take 10 mg by mouth  at bedtime.  . Cholecalciferol (VITAMIN D) 2000 UNITS tablet Take 2,000 Units by mouth daily.   . Cinnamon 500 MG capsule Take 2,000 mg by mouth daily.  . Cyanocobalamin (VITAMIN B 12 PO) Take 1,000 mcg by mouth daily.  Marland Kitchen escitalopram (LEXAPRO) 20 MG tablet Take 1 tablet Daily for Mood  . estradiol (ESTRACE) 1 MG tablet Take 1 tablet (1 mg total) by mouth daily.  Marland Kitchen gabapentin (NEURONTIN) 300 MG capsule TAKE 1 TO 2 CAPSULES 2 TO 3 TIMES DAILY AS NEEDED FOR HOT FLASHES.  Marland Kitchen levothyroxine (SYNTHROID) 50 MCG tablet Take 1 tablet daily on an empty stomach with only water for 30 minutes & no Antacid meds, Calcium or Magnesium for 4 hours & avoid Biotin  . Magnesium 400 MG TABS Take 1 tablet by mouth 2 (two) times daily.  . Omega-3 Fatty Acids (OMEGA-3 FISH OIL) 1200 MG CAPS Take 2 capsules by mouth daily.  . pantoprazole (PROTONIX) 40 MG tablet Take 1 tablet   Daily   to Prevent Heartburn & Indigestion  . progesterone (PROMETRIUM) 100 MG capsule Take 1 capsule (100 mg total) by mouth at bedtime.  . Red Yeast Rice Extract (RED YEAST RICE PO) Take 2,400 mg by mouth daily.     Allergies  Allergen Reactions  . Itraconazole   . Penicillins Hives  . Sulfa Antibiotics Hives   Past Medical History:  Diagnosis Date  . Allergy    seasonal  . GERD (gastroesophageal reflux disease)   . Hyperlipidemia   . Labile hypertension   . Prediabetes   . Raynaud's disease   . Thyroid disease    hypothyroid  . Vitamin D deficiency    Health Maintenance  Topic Date Due  . INFLUENZA VACCINE  07/15/2020  . MAMMOGRAM  08/17/2020  . PAP SMEAR-Modifier  05/09/2022  . COLONOSCOPY  05/28/2022  . TETANUS/TDAP  07/02/2027  . COVID-19 Vaccine  Completed  . Hepatitis C Screening  Completed  . HIV Screening  Completed   Immunization History  Administered Date(s) Administered  . Hepatitis B 06/06/2002, 07/07/2002, 12/29/2002  . Influenza-Unspecified 10/14/2018  . PFIZER SARS-COV-2 Vaccination 02/19/2020,  03/11/2020  . PPD Test 05/09/2014, 05/10/2015, 06/04/2016, 07/01/2017, 07/26/2018, 08/15/2019  . Pneumococcal-Unspecified 12/15/1992  . Td 01/06/2007  . Tdap 07/01/2017  . Zoster Recombinat (Shingrix) 03/01/2018, 05/21/2018    Last Colon - 05/28/2012 - Dr Deatra Ina - recc 10 yr f/u due June 2023  Last MGM - 08/18/2019  Past Surgical History:  Procedure Laterality Date  . TONSILLECTOMY     Family History  Problem Relation Age of Onset  . Pancreatic cancer Paternal Grandmother   . Arthritis Mother        Living  . COPD Mother   . Asthma Mother   . Hypertension Father        Living  . Diabetes Father   . Kidney disease Father        Stones  . Hyperlipidemia Father   . Other Brother        Deceased  . Breast cancer Neg Hx    Social History   Tobacco Use  . Smoking status: Never Smoker  . Smokeless tobacco: Never Used  Vaping Use  . Vaping Use: Never used  Substance Use Topics  . Alcohol use: Yes    Alcohol/week: 1.0 standard drink    Types: 1 Standard drinks or equivalent per week    Comment: 2 glasses  per week  . Drug use: No    ROS Constitutional: Denies fever, chills, weight loss/gain, headaches, insomnia,  night sweats, and change in appetite. Does c/o fatigue. Eyes: Denies redness, blurred vision, diplopia, discharge, itchy, watery eyes.  ENT: Denies discharge, congestion, post nasal drip, epistaxis, sore throat, earache, hearing loss, dental pain, Tinnitus, Vertigo, Sinus pain, snoring.  Cardio: Denies chest pain, palpitations, irregular heartbeat, syncope, dyspnea, diaphoresis, orthopnea, PND, claudication, edema Respiratory: denies cough, dyspnea, DOE, pleurisy, hoarseness, laryngitis, wheezing.  Gastrointestinal: Denies dysphagia, heartburn, reflux, water brash, pain, cramps, nausea, vomiting, bloating, diarrhea, constipation, hematemesis, melena, hematochezia, jaundice, hemorrhoids Genitourinary: Denies dysuria, frequency, urgency, nocturia, hesitancy,  discharge, hematuria, flank pain Breast: Breast lumps, nipple discharge, bleeding.  Musculoskeletal: Denies arthralgia, myalgia, stiffness, Jt. Swelling, pain, limp, and strain/sprain. Denies falls. Skin: Denies puritis, rash, hives, warts, acne, eczema, changing in skin lesion Neuro: No weakness, tremor, incoordination, spasms, paresthesia, pain Psychiatric: Denies confusion, memory loss, sensory loss. Denies Depression. Endocrine: Denies change in weight, skin, hair change, nocturia, and paresthesia, diabetic polys, visual blurring, hyper / hypo glycemic episodes.  Heme/Lymph: No excessive bleeding, bruising, enlarged lymph nodes.  Physical Exam  BP 122/80   P 68   T 96.7 F   R 16   Ht 5\' 4"    Wt 148 lb    LMP 2014   BMI 25.51 k  General Appearance: Well nourished, well groomed and in no apparent distress.  Eyes: PERRLA, EOMs, conjunctiva  no swelling or erythema, normal fundi and vessels. Sinuses: No frontal/maxillary tenderness ENT/Mouth: EACs patent / TMs  nl. Nares clear without erythema, swelling, mucoid exudates. Oral hygiene is good. No erythema, swelling, or exudate. Tongue normal, non-obstructing. Tonsils not swollen or erythematous. Hearing normal.  Neck: Supple, thyroid not palpable. No bruits, nodes or JVD. Respiratory: Respiratory effort normal.  BS equal and clear bilateral without rales, rhonci, wheezing or stridor. Cardio: Heart sounds are normal with regular rate and rhythm and no murmurs, rubs or gallops. Peripheral pulses are normal and equal bilaterally without edema. No aortic or femoral bruits. Chest: symmetric with normal excursions and percussion. Breasts: Symmetric, without lumps, nipple discharge, retractions, or fibrocystic changes.  Abdomen: Flat, soft with bowel sounds active. Nontender, no guarding, rebound, hernias, masses, or organomegaly.  Lymphatics: Non tender without lymphadenopathy.  Musculoskeletal: Full ROM all peripheral extremities, joint  stability, 5/5 strength, and normal gait. Skin: Warm and dry without rashes, lesions, cyanosis, clubbing or  ecchymosis.  Neuro: Cranial nerves intact, reflexes equal bilaterally. Normal muscle tone, no cerebellar symptoms. Sensation intact.  Pysch: Alert and oriented X 3, normal affect, Insight and Judgment appropriate.   Assessment and Plan  1. Annual Preventative Screening Examination   2. Labile hypertension  - EKG 12-Lead - [place], RETROPERITNL ABD,  LTD - Urinalysis, Routine w reflex microscopic - Microalbumin / creatinine urine ratio - CBC with Differential/Platelet - COMPLETE METABOLIC PANEL WITH GFR - Magnesium - TSH  3. Hyperlipidemia, mixed  - EKG 12-Lead - [place], RETROPERITNL ABD,  LTD - Lipid panel - TSH  4. Abnormal glucose  - EKG 12-Lead - [place], RETROPERITNL ABD,  LTD - Hemoglobin A1c - Insulin, random  5. Vitamin D deficiency  - VITAMIN D 25 Hydroxy  6. Prediabetes  - EKG 12-Lead - [place], RETROPERITNL ABD,  LTD - Hemoglobin A1c - Insulin, random  7. Hypothyroidism  - TSH  8. Gastroesophageal reflux disease  - CBC with Differential/Platelet  9. Screening examination for pulmonary tuberculosis  - TB Skin Test - CBC with Differential/Platelet  10. Screening for colorectal cancer  - POC Hemoccult Bld/Stl  11. Screening for ischemic heart disease  - EKG 12-Lead  12. FH: hypertension  - EKG 12-Lead - [place], RETROPERITNL ABD,  LTD  13. Screening for AAA   - [place], RETROPERITNL ABD,  LTD  14. Fatigue  - Iron,Total/Total Iron Binding Cap - Vitamin B12 - CBC with Differential/Platelet - TSH  15. Medication management  - Urinalysis, Routine w reflex microscopic - Microalbumin / creatinine urine ratio - CBC with Differential/Platelet - COMPLETE METABOLIC PANEL WITH GFR - Magnesium - Lipid panel - TSH - Hemoglobin A1c - Insulin, random - VITAMIN D 25 Hydroxy            Patient was counseled in prudent diet to achieve/maintain BMI less  than 25 for weight control, BP monitoring, regular exercise and medications. Discussed med's effects and SE's. Screening labs and tests as requested with regular follow-up as recommended. Over 40 minutes of exam, counseling, chart review and high complex critical decision making was performed.   Kirtland Bouchard, MD

## 2020-08-31 ENCOUNTER — Other Ambulatory Visit: Payer: Self-pay | Admitting: Internal Medicine

## 2020-08-31 DIAGNOSIS — Z1231 Encounter for screening mammogram for malignant neoplasm of breast: Secondary | ICD-10-CM

## 2020-08-31 LAB — LIPID PANEL
Cholesterol: 205 mg/dL — ABNORMAL HIGH (ref <200)
HDL: 79 mg/dL (ref >=50)
LDL Cholesterol (Calc): 110 mg/dL (calc) — ABNORMAL HIGH
Non-HDL Cholesterol (Calc): 126 mg/dL (calc) (ref <130)
Total CHOL/HDL Ratio: 2.6 (calc) (ref <5.0)
Triglycerides: 72 mg/dL (ref <150)

## 2020-08-31 LAB — COMPLETE METABOLIC PANEL WITH GFR
AG Ratio: 1.6 (calc) (ref 1.0–2.5)
ALT: 13 U/L (ref 6–29)
AST: 19 U/L (ref 10–35)
Albumin: 4.5 g/dL (ref 3.6–5.1)
Alkaline phosphatase (APISO): 49 U/L (ref 37–153)
BUN: 15 mg/dL (ref 7–25)
CO2: 33 mmol/L — ABNORMAL HIGH (ref 20–32)
Calcium: 9.5 mg/dL (ref 8.6–10.4)
Chloride: 103 mmol/L (ref 98–110)
Creat: 0.86 mg/dL (ref 0.50–1.05)
GFR, Est African American: 86 mL/min/{1.73_m2} (ref >=60)
GFR, Est Non African American: 74 mL/min/{1.73_m2} (ref >=60)
Globulin: 2.9 g/dL (calc) (ref 1.9–3.7)
Glucose, Bld: 95 mg/dL (ref 65–99)
Potassium: 5 mmol/L (ref 3.5–5.3)
Sodium: 141 mmol/L (ref 135–146)
Total Bilirubin: 0.4 mg/dL (ref 0.2–1.2)
Total Protein: 7.4 g/dL (ref 6.1–8.1)

## 2020-08-31 LAB — CBC WITH DIFFERENTIAL/PLATELET
Absolute Monocytes: 499 cells/uL (ref 200–950)
Basophils Absolute: 57 cells/uL (ref 0–200)
Basophils Relative: 1.1 %
Eosinophils Absolute: 260 cells/uL (ref 15–500)
Eosinophils Relative: 5 %
HCT: 43.4 % (ref 35.0–45.0)
Hemoglobin: 14.1 g/dL (ref 11.7–15.5)
Lymphs Abs: 1732 cells/uL (ref 850–3900)
MCH: 29.3 pg (ref 27.0–33.0)
MCHC: 32.5 g/dL (ref 32.0–36.0)
MCV: 90 fL (ref 80.0–100.0)
MPV: 10.2 fL (ref 7.5–12.5)
Monocytes Relative: 9.6 %
Neutro Abs: 2652 cells/uL (ref 1500–7800)
Neutrophils Relative %: 51 %
Platelets: 246 10*3/uL (ref 140–400)
RBC: 4.82 10*6/uL (ref 3.80–5.10)
RDW: 12.5 % (ref 11.0–15.0)
Total Lymphocyte: 33.3 %
WBC: 5.2 10*3/uL (ref 3.8–10.8)

## 2020-08-31 LAB — MICROALBUMIN / CREATININE URINE RATIO
Creatinine, Urine: 33 mg/dL (ref 20–275)
Microalb Creat Ratio: 9 mcg/mg creat (ref <30)
Microalb, Ur: 0.3 mg/dL

## 2020-08-31 LAB — URINALYSIS, ROUTINE W REFLEX MICROSCOPIC
Bilirubin Urine: NEGATIVE
Glucose, UA: NEGATIVE
Hgb urine dipstick: NEGATIVE
Ketones, ur: NEGATIVE
Leukocytes,Ua: NEGATIVE
Nitrite: NEGATIVE
Protein, ur: NEGATIVE
Specific Gravity, Urine: 1.009 (ref 1.001–1.03)
pH: 8 (ref 5.0–8.0)

## 2020-08-31 LAB — VITAMIN B12: Vitamin B-12: 734 pg/mL (ref 200–1100)

## 2020-08-31 LAB — IRON, TOTAL/TOTAL IRON BINDING CAP
%SAT: 14 % (calc) — ABNORMAL LOW (ref 16–45)
Iron: 51 ug/dL (ref 45–160)
TIBC: 363 mcg/dL (calc) (ref 250–450)

## 2020-08-31 LAB — HEMOGLOBIN A1C
Hgb A1c MFr Bld: 5.5 % of total Hgb (ref <5.7)
Mean Plasma Glucose: 111 (calc)
eAG (mmol/L): 6.2 (calc)

## 2020-08-31 LAB — TSH: TSH: 2.71 mIU/L (ref 0.40–4.50)

## 2020-08-31 LAB — MAGNESIUM: Magnesium: 2.4 mg/dL (ref 1.5–2.5)

## 2020-08-31 LAB — INSULIN, RANDOM: Insulin: 5.4 u[IU]/mL

## 2020-08-31 LAB — VITAMIN D 25 HYDROXY (VIT D DEFICIENCY, FRACTURES): Vit D, 25-Hydroxy: 48 ng/mL (ref 30–100)

## 2020-08-31 MED ORDER — ROSUVASTATIN CALCIUM 20 MG PO TABS: ORAL_TABLET | ORAL | 1 refills | Status: DC

## 2020-08-31 NOTE — Progress Notes (Signed)
========================================================== -   Test results slightly outside the reference range are not unusual. If there is anything important, I will review this with you,  otherwise it is considered normal test values.  If you have further questions,  please do not hesitate to contact me at the office or via My Chart.  ==========================================================  -  Iron is low - suggest take an OTC iron supplement  - Also , Eat more  More Veggies with Iron as Carrots, Beets , all leafy green veggies as Spinach, Collards,  Turnip - Mustard or Mixed Greens, Kale, Asparagus, Broccoli, Brussel Sprouts, Green Beans / peas, Soybeans, Lentils, Sweet Potatoes ==========================================================  -  Vitamin B12 - is Normal & OK  ==========================================================  -  Total Chol = 205 - is OK since good HDL Chol = 79 is so high.   - But   - Bad  / Dangerous LDL Chol  = 110 is too high  (Ideal or Goal is less than 70)   - So recommend stop the Red Yeast Rice Extract  &   - I sent in new Rx for low dose Crestor /Rosuvastatin   - Please call office to schedule a 3 month Office visit to  recheck cholesterol ==========================================================  - Diet is still very important  - Recommend  a stricter low cholesterol diet   - Cholesterol only comes from animal sources  - ie. meat, dairy, egg yolks  - Eat all the vegetables you want.  - Avoid meat, especially red meat - Beef AND Pork .  - Avoid cheese & dairy - milk & ice cream.     - Cheese is the most concentrated form of trans-fats which  is the worst thing to clog up our arteries.   - Veggie cheese is OK which can be found in the fresh  produce section at [employer] or [employer] or [employer] ==========================================================  -  A1c - Normal - Great - No Diabetes   ! ==========================================================  -  Vitamin D = 48 - Low   - Vitamin D goal is between 70-100.   - Please make sure that you are taking your Vitamin D as directed.   - It is very important as a natural anti-inflammatory and helping the  immune system protect against viral infections, like the Covid-19    helping hair, skin, and nails, as well as reducing stroke and  heart attack risk.   - It helps your bones and helps with mood.  - It also decreases numerous cancer risks so please take it as directed.   - Low Vit D is associated with a 200-300% higher risk for CANCER   and 200-300% higher risk for HEART   ATTACK  &  STROKE.    - It is also associated with higher death rate at younger ages,   autoimmune diseases like Rheumatoid arthritis, Lupus,  Multiple Sclerosis.     - Also many other serious conditions, like depression, Alzheimer's  Dementia, infertility, muscle aches, fatigue, fibromyalgia  - just to name a few.  ==========================================================  - All Else - CBC - Kidneys - Electrolytes - Liver - Magnesium & Thyroid    - all  Normal / OK ====================================================

## 2020-09-01 ENCOUNTER — Encounter: Payer: Self-pay | Admitting: Internal Medicine

## 2020-09-02 IMAGING — MG MM DIGITAL SCREENING BILAT W/ TOMO W/ CAD
8 series · 9 of 24 positions shown · non-contrast
Comparison: Previous exam(s).

CLINICAL DATA: Screening.

EXAM:
DIGITAL SCREENING BILATERAL MAMMOGRAM WITH TOMO AND CAD

[R MLO synth-2D]
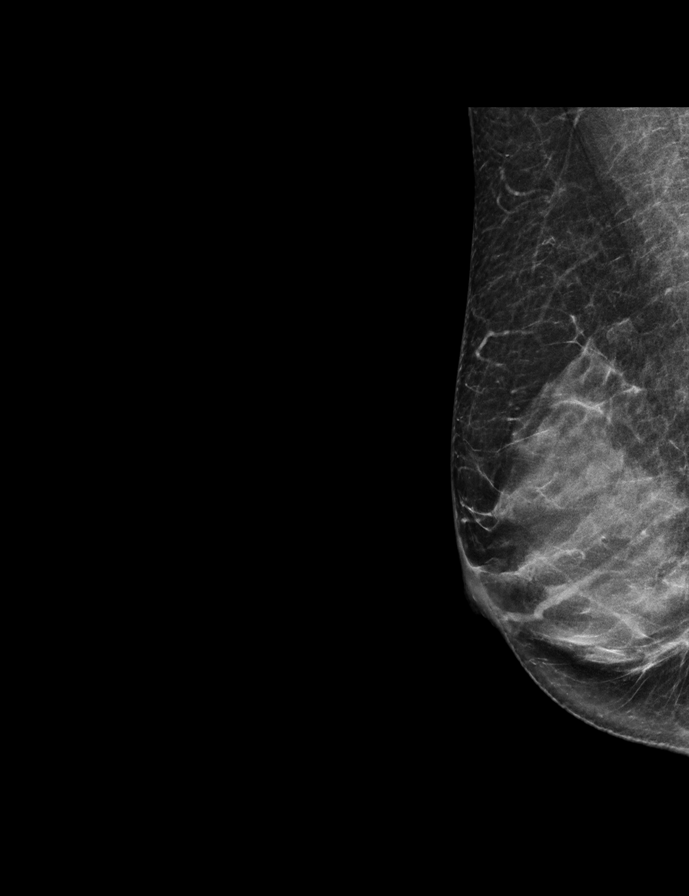

[L MLO synth-2D]
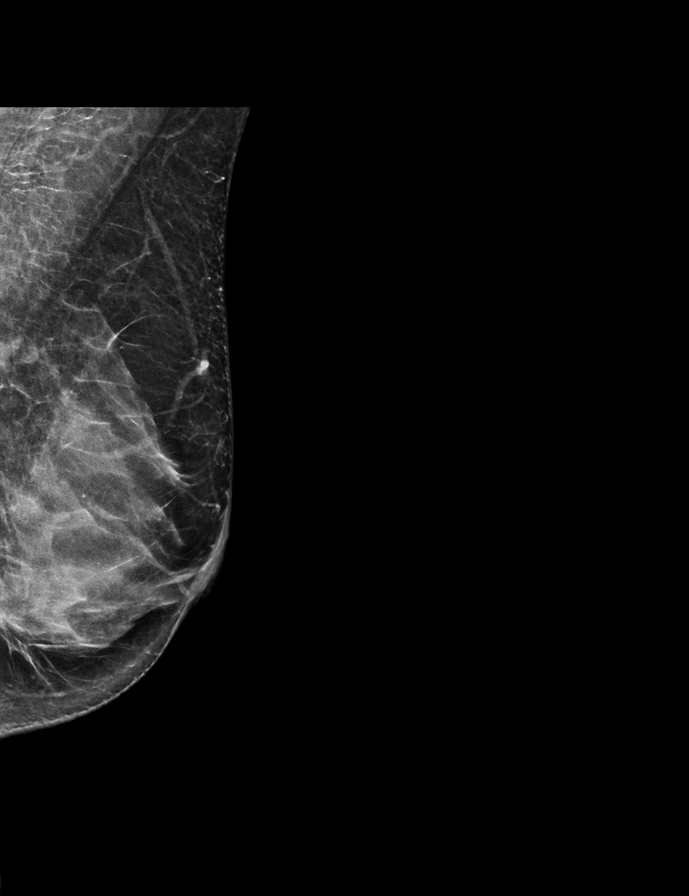

[L CC synth-2D]
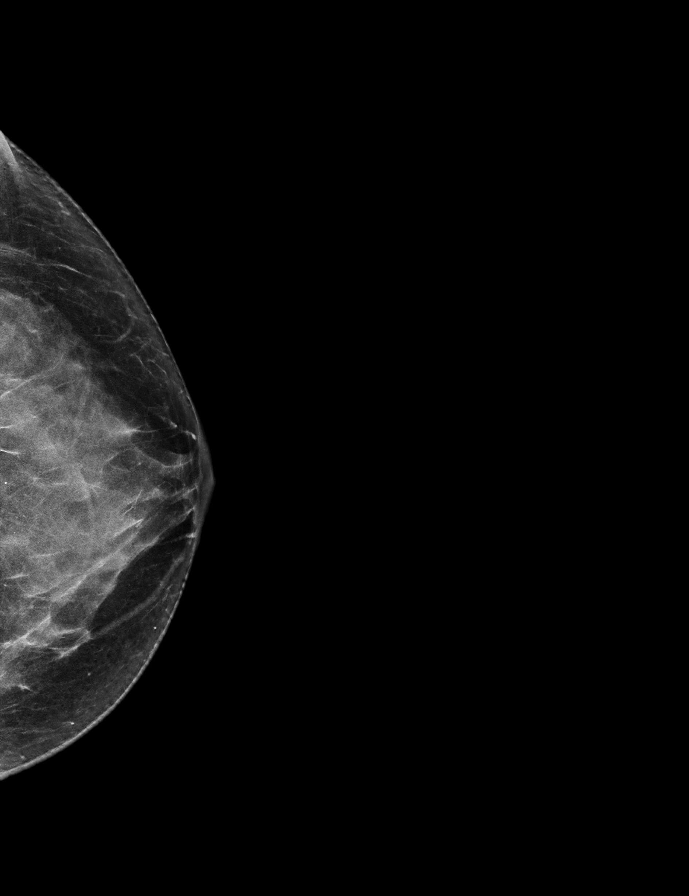

[R CC synth-2D]
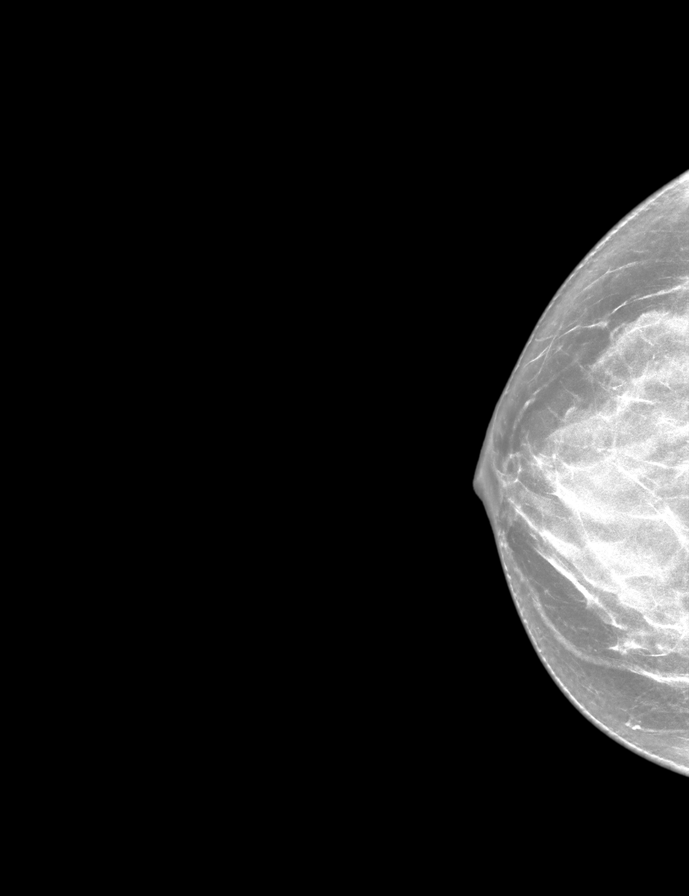

[L MLO tomo · 2 of 60 frames shown]
[frame 20/60]
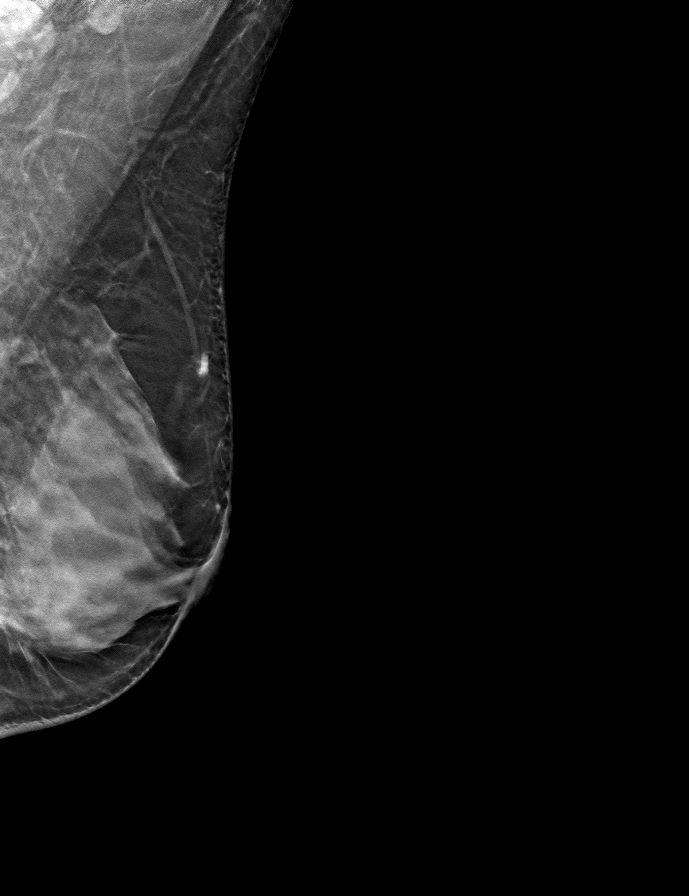
[frame 31/60]
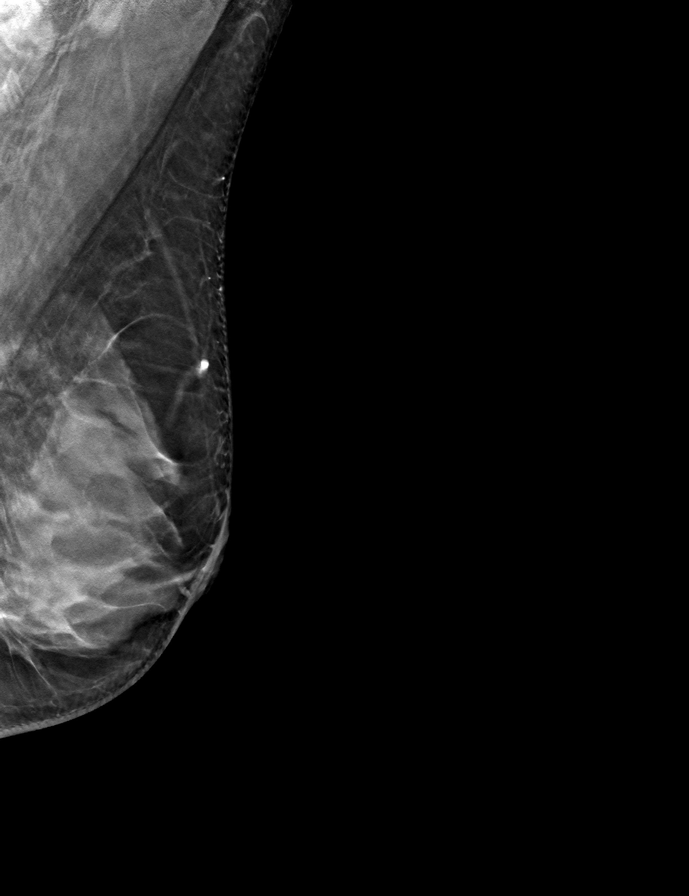

[R MLO tomo · tomo slice 29/58.0]
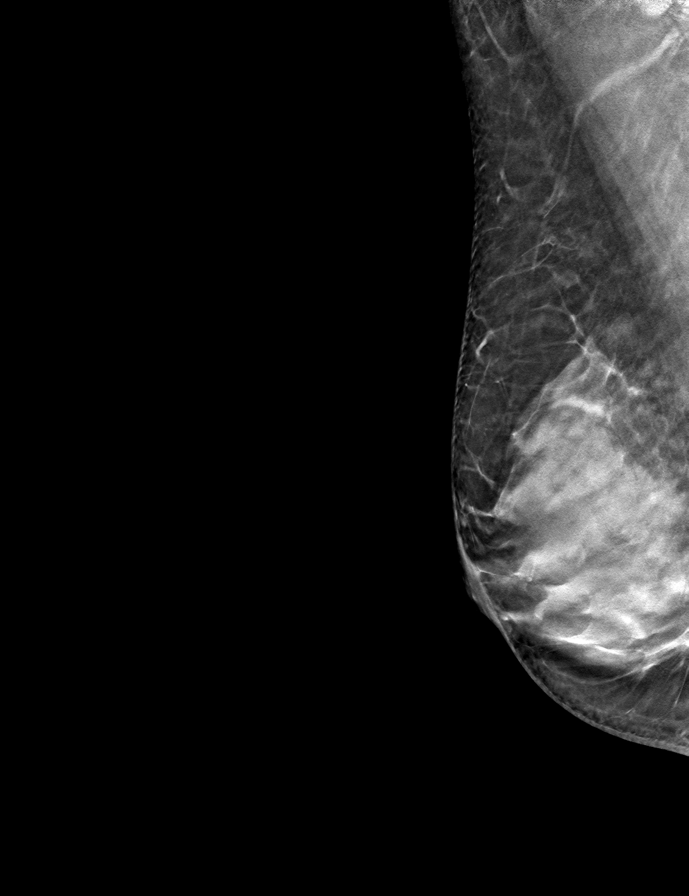

[R CC tomo · tomo slice 29/57.0]
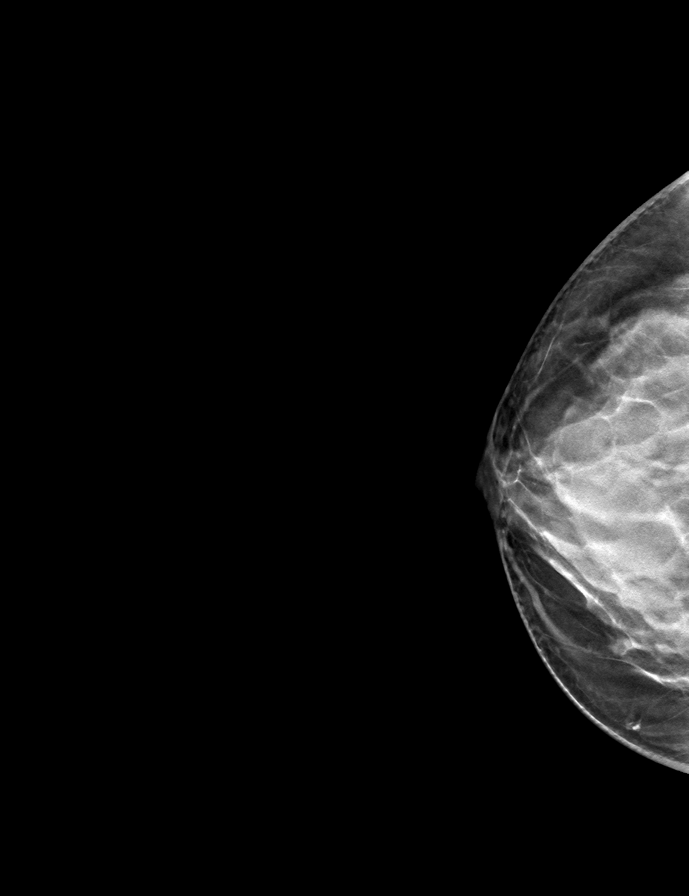

[L CC tomo · tomo slice 29/58.0]
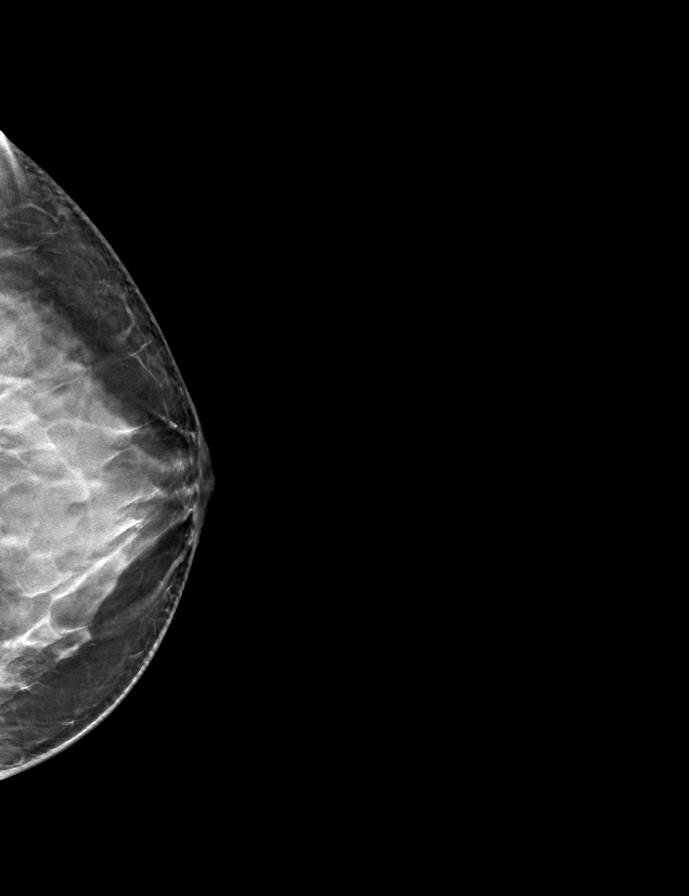

[9 of 24 positions shown; findings below may reference images not displayed]

ACR Breast Density Category d: The breast tissue is extremely dense,
which lowers the sensitivity of mammography
FINDINGS: There are no findings suspicious for malignancy. Images were
processed with CAD.
IMPRESSION: No mammographic evidence of malignancy. A result letter of this
screening mammogram will be mailed directly to the patient.

RECOMMENDATION:
Screening mammogram in one year. (Code:WO-[id number])

BI-RADS CATEGORY  1: Negative.

## 2020-09-03 ENCOUNTER — Encounter: Payer: BLUE CROSS/BLUE SHIELD | Admitting: Internal Medicine

## 2020-09-04 ENCOUNTER — Ambulatory Visit: Payer: BC Managed Care – PPO | Admitting: Podiatry

## 2020-09-04 ENCOUNTER — Encounter: Payer: Self-pay | Admitting: Podiatry

## 2020-09-04 ENCOUNTER — Other Ambulatory Visit: Payer: Self-pay

## 2020-09-04 DIAGNOSIS — S86312D Strain of muscle(s) and tendon(s) of peroneal muscle group at lower leg level, left leg, subsequent encounter: Secondary | ICD-10-CM | POA: Diagnosis not present

## 2020-09-04 DIAGNOSIS — M898X7 Other specified disorders of bone, ankle and foot: Secondary | ICD-10-CM

## 2020-09-04 LAB — TB SKIN TEST
Induration: 0 mm
TB Skin Test: NEGATIVE

## 2020-09-06 NOTE — Progress Notes (Signed)
She presents today for surgical consult regarding her peroneal tendons diagnosed by MRI with a tear back in July.  We also have pain chronic in nature along the lateral aspect of the fifth metatarsal laterally and plantarly.  She states that is starting to affect her ability to perform her daily activities that she feels is that she cannot stay active.  She denies any changes in her past medical history medications allergies surgeries and social history.  Objective: Vital signs are stable alert oriented x3.  Pulses are palpable.  She has pain on eversion against resistance particular along the fifth metatarsal base and along the length of the peroneus brevis tendon to the level of the ankle joint.  Assessment: Peroneus brevis tendon tear longitudinal split tear.  She also has painful exostosis fifth metatarsal base left.  Plan: Discussed etiology pathology conservative or surgical therapies exostectomy to the fifth metatarsal base left and a repair of the peroneus brevis tendon with a possible cast is scheduled.  We consented her for this today discussed etiology pathology conservative versus surgical therapies.  We did discuss the possible postop complications which may include but are not limited to postop pain bleeding swelling infection recurrence need for further surgery overcorrection under correction also digit loss of limb loss of life.

## 2020-09-12 ENCOUNTER — Telehealth: Payer: Self-pay

## 2020-09-12 NOTE — Telephone Encounter (Signed)
DOS 09/28/2020  REPAIR PERONEUS TENDON LT - 48403 EXOSTECTOMY 5TH MET LT - 28108  [employer] EFFECTIVE DATE -10/16/2019  PLAN DEDUCTIBLE - $1500.00 W/ $979.53 REMAINING OUT OF POCKET - 4500.00 W/ $2765.47 REMAINING COPAY $0.00 COINSURANCE - 30%  NO AUTH REQUIRED PER WEBSITE

## 2020-09-17 ENCOUNTER — Ambulatory Visit
Admission: RE | Admit: 2020-09-17 | Discharge: 2020-09-17 | Disposition: A | Discharge status: Home / Self Care | Payer: BC Managed Care – PPO | Source: Ambulatory Visit | Attending: Internal Medicine | Admitting: Internal Medicine

## 2020-09-17 ENCOUNTER — Other Ambulatory Visit: Payer: Self-pay

## 2020-09-17 DIAGNOSIS — Z1231 Encounter for screening mammogram for malignant neoplasm of breast: Secondary | ICD-10-CM

## 2020-09-26 ENCOUNTER — Other Ambulatory Visit: Payer: Self-pay | Admitting: Podiatry

## 2020-09-26 MED ORDER — HYDROCODONE-ACETAMINOPHEN 10-325 MG PO TABS
1.0000 | ORAL_TABLET | Freq: Four times a day (QID) | ORAL | 0 refills | Status: AC | PRN
Start: 2020-09-26 — End: 2020-10-03

## 2020-09-26 MED ORDER — CLINDAMYCIN HCL 150 MG PO CAPS: 150.0000 mg | ORAL_CAPSULE | Freq: Three times a day (TID) | ORAL | 0 refills | Status: DC

## 2020-09-26 MED ORDER — ONDANSETRON HCL 4 MG PO TABS: 4.0000 mg | ORAL_TABLET | Freq: Three times a day (TID) | ORAL | 0 refills | Status: DC | PRN

## 2020-09-28 DIAGNOSIS — M898X7 Other specified disorders of bone, ankle and foot: Secondary | ICD-10-CM

## 2020-09-28 DIAGNOSIS — S86312D Strain of muscle(s) and tendon(s) of peroneal muscle group at lower leg level, left leg, subsequent encounter: Secondary | ICD-10-CM | POA: Diagnosis not present

## 2020-09-28 HISTORY — PX: FOOT SURGERY: SHX648

## 2020-10-01 ENCOUNTER — Telehealth: Payer: Self-pay

## 2020-10-01 NOTE — Telephone Encounter (Signed)
Pt had surgery on left foot and ankle this past Friday 09/28/20 with Dr. Milinda Pointer. Pt has on a cast, loss her balance this morning and fell on foot that has the cast. Pt knows that she isn't suppose to have any weight on the foot but she came down very hard. She is scheduled to see Dr. Posey Pronto on Friday, please advise if pt need to come in earlier.

## 2020-10-02 ENCOUNTER — Other Ambulatory Visit: Payer: Self-pay

## 2020-10-02 DIAGNOSIS — Z1211 Encounter for screening for malignant neoplasm of colon: Secondary | ICD-10-CM

## 2020-10-02 DIAGNOSIS — Z1212 Encounter for screening for malignant neoplasm of rectum: Secondary | ICD-10-CM

## 2020-10-02 LAB — POC HEMOCCULT BLD/STL (HOME/3-CARD/SCREEN)
Card #2 Fecal Occult Blod, POC: NEGATIVE
Card #3 Fecal Occult Blood, POC: NEGATIVE
Fecal Occult Blood, POC: NEGATIVE

## 2020-10-02 NOTE — Telephone Encounter (Signed)
That should be fine as long as she is not having acute pain or any calf pain or is unable to wiggle her toes/loss of motor or sensory functions.

## 2020-10-02 NOTE — Telephone Encounter (Signed)
Call pt and updated her with the following information. Pts states that she isn't having any pain. It was painful after the fall but she isn't experiencing any type of pain right now. I did let the pt know to give [place] a call back if she starts to have pain in her calf area and unable to wiggle her toes.

## 2020-10-03 DIAGNOSIS — Z1211 Encounter for screening for malignant neoplasm of colon: Secondary | ICD-10-CM

## 2020-10-03 DIAGNOSIS — Z1212 Encounter for screening for malignant neoplasm of rectum: Secondary | ICD-10-CM | POA: Diagnosis not present

## 2020-10-05 ENCOUNTER — Ambulatory Visit (INDEPENDENT_AMBULATORY_CARE_PROVIDER_SITE_OTHER): Payer: BC Managed Care – PPO

## 2020-10-05 ENCOUNTER — Ambulatory Visit (INDEPENDENT_AMBULATORY_CARE_PROVIDER_SITE_OTHER): Payer: BC Managed Care – PPO | Admitting: Podiatry

## 2020-10-05 ENCOUNTER — Other Ambulatory Visit: Payer: BC Managed Care – PPO

## 2020-10-05 ENCOUNTER — Other Ambulatory Visit: Payer: Self-pay

## 2020-10-05 DIAGNOSIS — S86312D Strain of muscle(s) and tendon(s) of peroneal muscle group at lower leg level, left leg, subsequent encounter: Secondary | ICD-10-CM

## 2020-10-05 DIAGNOSIS — M898X7 Other specified disorders of bone, ankle and foot: Secondary | ICD-10-CM

## 2020-10-05 DIAGNOSIS — Z9889 Other specified postprocedural states: Secondary | ICD-10-CM

## 2020-10-09 ENCOUNTER — Encounter: Payer: Self-pay | Admitting: Podiatry

## 2020-10-09 NOTE — Progress Notes (Signed)
Subjective:  Patient ID: Beth Miranda, female    DOB: Jul 10, 1961,  MRN: [id number]  Chief Complaint  Patient presents with  . Routine Post Op    PT has no concerns at this time but does have some pain that comes and goes PT stated that she has burning and tingling    DOS: 09/28/2020 Procedure: Primary repair of peroneal tendon  59 y.o. female returns for post-op check.  Patient is doing well.  She has mild pain.  But overall pain is doing much better there are some burning and tingling associated with it.  No other acute complaints.  Review of Systems: Negative except as noted in the HPI. Denies N/V/F/Ch.  Past Medical History:  Diagnosis Date  . Allergy    seasonal  . GERD (gastroesophageal reflux disease)   . Hyperlipidemia   . Labile hypertension   . Prediabetes   . Raynaud's disease   . Thyroid disease    hypothyroid  . Vitamin D deficiency     Current Outpatient Medications:  .  ALPRAZolam (XANAX) 0.5 MG tablet, Take 0.5-1 tablets (0.25-0.5 mg total) by mouth 2 (two) times daily as needed for anxiety or sleep., Disp: 60 tablet, Rfl: 0 .  aspirin 81 MG tablet, Take 81 mg by mouth daily., Disp: , Rfl:  .  Cetirizine HCl (ZYRTEC ALLERGY PO), Take 10 mg by mouth at bedtime., Disp: , Rfl:  .  Cholecalciferol (VITAMIN D) 2000 UNITS tablet, Take 2,000 Units by mouth daily. , Disp: , Rfl:  .  Cinnamon 500 MG capsule, Take 2,000 mg by mouth daily., Disp: , Rfl:  .  clindamycin (CLEOCIN) 150 MG capsule, Take 1 capsule (150 mg total) by mouth 3 (three) times daily., Disp: 30 capsule, Rfl: 0 .  Cyanocobalamin (VITAMIN B 12 PO), Take 1,000 mcg by mouth daily., Disp: , Rfl:  .  escitalopram (LEXAPRO) 20 MG tablet, Take 1 tablet Daily for Mood, Disp: 90 tablet, Rfl: 0 .  estradiol (ESTRACE) 1 MG tablet, Take 1 tablet (1 mg total) by mouth daily., Disp: 90 tablet, Rfl: 4 .  gabapentin (NEURONTIN) 300 MG capsule, TAKE 1 TO 2 CAPSULES 2 TO 3 TIMES DAILY AS NEEDED FOR HOT FLASHES.,  Disp: 180 capsule, Rfl: 0 .  levothyroxine (SYNTHROID) 50 MCG tablet, Take 1 tablet daily on an empty stomach with only water for 30 minutes & no Antacid meds, Calcium or Magnesium for 4 hours & avoid Biotin, Disp: 90 tablet, Rfl: 0 .  Magnesium 400 MG TABS, Take 1 tablet by mouth 2 (two) times daily., Disp: , Rfl:  .  Omega-3 Fatty Acids (OMEGA-3 FISH OIL) 1200 MG CAPS, Take 2 capsules by mouth daily., Disp: , Rfl:  .  ondansetron (ZOFRAN) 4 MG tablet, Take 1 tablet (4 mg total) by mouth every 8 (eight) hours as needed., Disp: 20 tablet, Rfl: 0 .  pantoprazole (PROTONIX) 40 MG tablet, Take 1 tablet   Daily   to Prevent Heartburn & Indigestion, Disp: 90 tablet, Rfl: 0 .  progesterone (PROMETRIUM) 100 MG capsule, Take 1 capsule (100 mg total) by mouth at bedtime., Disp: 90 capsule, Rfl: 4 .  rosuvastatin (CRESTOR) 20 MG tablet, Take   1 tablet    Daily    for Cholesterol, Disp: 90 tablet, Rfl: 1  Social History   Tobacco Use  Smoking Status Never Smoker  Smokeless Tobacco Never Used    Allergies  Allergen Reactions  . Itraconazole   . Penicillins Hives  . Sulfa Antibiotics  Hives   Objective:  There were no vitals filed for this visit. There is no height or weight on file to calculate BMI. Constitutional Well developed. Well nourished.  Vascular Foot warm and well perfused. Capillary refill normal to all digits.   Neurologic Normal speech. Oriented to person, place, and time. Epicritic sensation to light touch grossly present bilaterally.  Dermatologic  cast is intact.  No loss of motor or sensory function noted.  No calf pain.  Cap refill within normal limits.  Orthopedic: Tenderness to palpation noted about the surgical site.   Radiographs: 3 views of skeletally mature adult foot: No osseous abnormalities noted no breaks noted.  No other issues noted. Assessment:   1. Tear of peroneal tendon, left, subsequent encounter   2. Exostosis of left foot   3. Status post foot surgery     Plan:  Patient was evaluated and treated and all questions answered.  S/p foot surgery left -Progressing as expected post-operatively. -XR: None -WB Status: Nonweightbearing in left lower extremity -Sutures: Cast was not taken down.  Plan on taking out during next clinical visit. -Medications: None -Foot redressed.  No follow-ups on file.

## 2020-10-11 ENCOUNTER — Other Ambulatory Visit: Payer: Self-pay

## 2020-10-11 ENCOUNTER — Encounter: Payer: Self-pay | Admitting: Podiatry

## 2020-10-11 ENCOUNTER — Ambulatory Visit (INDEPENDENT_AMBULATORY_CARE_PROVIDER_SITE_OTHER): Payer: BC Managed Care – PPO | Admitting: Podiatry

## 2020-10-11 DIAGNOSIS — Z9889 Other specified postprocedural states: Secondary | ICD-10-CM

## 2020-10-11 DIAGNOSIS — M898X7 Other specified disorders of bone, ankle and foot: Secondary | ICD-10-CM

## 2020-10-11 DIAGNOSIS — S86312D Strain of muscle(s) and tendon(s) of peroneal muscle group at lower leg level, left leg, subsequent encounter: Secondary | ICD-10-CM

## 2020-10-13 NOTE — Progress Notes (Signed)
She presents today for follow-up of her peroneal tendon repair left.  States that it seems to be doing pretty good do not have a whole lot of pain with it.  Objective: Vital signs are stable she is alert and oriented x3 cast is intact dry and clean once removed demonstrates dry sterile compressive dressing intact no blood no active bleeding was removed demonstrates no erythema to some mild edema no cellulitis drainage or odor staples are intact.  She has some mild tenderness overlying the incision site about midway's down the incision.  No signs of infection.  Assessment: Well-healing surgical foot left.  Plan: Redressed today dressed a compressive dressing redressed with a new cast.  I will follow-up with her in 2 weeks at which time we will remove the staples the cast and possibly apply another cast or her cam boot.  She will remain nonweightbearing at this time.

## 2020-10-22 ENCOUNTER — Other Ambulatory Visit: Payer: Self-pay | Admitting: Internal Medicine

## 2020-10-25 ENCOUNTER — Encounter: Payer: Self-pay | Admitting: Podiatry

## 2020-10-25 ENCOUNTER — Other Ambulatory Visit: Payer: Self-pay

## 2020-10-25 ENCOUNTER — Ambulatory Visit (INDEPENDENT_AMBULATORY_CARE_PROVIDER_SITE_OTHER): Payer: 59 | Admitting: Podiatry

## 2020-10-25 DIAGNOSIS — M898X7 Other specified disorders of bone, ankle and foot: Secondary | ICD-10-CM

## 2020-10-25 DIAGNOSIS — S86312D Strain of muscle(s) and tendon(s) of peroneal muscle group at lower leg level, left leg, subsequent encounter: Secondary | ICD-10-CM

## 2020-10-25 DIAGNOSIS — Z9889 Other specified postprocedural states: Secondary | ICD-10-CM

## 2020-10-27 NOTE — Progress Notes (Signed)
She presents today for a postop visit date of surgery 09/28/2020 primary repair of peroneus brevis tendon and 1/5 met base exostectomy.  She denies fever chills nausea vomiting muscle aches and pain states that she has some tenderness along the lateral incision site.  She is ready to have her cast removed.  Objective: Cast is dry and clean.  Was removed demonstrates dry sterile dressing was intact and clean.  Once removed demonstrates sutures are intact staples are intact all of which were removed today margins remain well coapted.  She has good range of motion dorsiflexion plantarflexion inversion and eversion  No signs of infection.  Assessment: Well-healing surgical foot and ankle.  Plan: Discussed etiology pathology conservative versus surgical therapies placed her in a compression anklet in her cam walker she is to remain nonweightbearing.  Follow-up with her in about 2 weeks

## 2020-11-02 ENCOUNTER — Other Ambulatory Visit: Payer: Self-pay | Admitting: Internal Medicine

## 2020-11-02 DIAGNOSIS — F321 Major depressive disorder, single episode, moderate: Secondary | ICD-10-CM

## 2020-11-06 ENCOUNTER — Other Ambulatory Visit: Payer: Self-pay

## 2020-11-06 ENCOUNTER — Other Ambulatory Visit: Payer: Self-pay | Admitting: Podiatry

## 2020-11-06 ENCOUNTER — Encounter: Payer: Self-pay | Admitting: Podiatry

## 2020-11-06 ENCOUNTER — Ambulatory Visit (INDEPENDENT_AMBULATORY_CARE_PROVIDER_SITE_OTHER): Payer: 59 | Admitting: Podiatry

## 2020-11-06 DIAGNOSIS — M898X7 Other specified disorders of bone, ankle and foot: Secondary | ICD-10-CM

## 2020-11-06 DIAGNOSIS — S86312D Strain of muscle(s) and tendon(s) of peroneal muscle group at lower leg level, left leg, subsequent encounter: Secondary | ICD-10-CM

## 2020-11-06 DIAGNOSIS — Z9889 Other specified postprocedural states: Secondary | ICD-10-CM

## 2020-11-12 NOTE — Progress Notes (Signed)
She presents today for her fourth postop visit date of surgery 09/28/2020 primary repair of peroneus brevis tendon exostectomy of the fifth metatarsal and a below-knee cast. She denies fever chills nausea vomiting muscle aches pains calf pain back pain chest pain shortness of breath. Presents today utilizing her knee scooter.  Objective: Vital signs are stable alert and oriented x3 cast is intact dry and clean once removed demonstrates no erythema edema cellulitis drainage or odor any remaining sutures and staples were removed today margins appear to be well coapted she has good range of motion mild tenderness on palpation of the fifth metatarsal base laterally. Good dorsiflexion and plantarflexion minimal edema.  Assessment: Well-healing surgical foot.  Plan: Discussed etiology pathology and surgical therapies this point were going to start her partial weightbearing progressing to weightbearing over the next few weeks and back into regular shoes I will follow-up with her within the month.

## 2020-11-27 ENCOUNTER — Other Ambulatory Visit: Payer: Self-pay

## 2020-11-27 ENCOUNTER — Ambulatory Visit (INDEPENDENT_AMBULATORY_CARE_PROVIDER_SITE_OTHER): Payer: 59 | Admitting: Podiatry

## 2020-11-27 ENCOUNTER — Encounter: Payer: Self-pay | Admitting: Podiatry

## 2020-11-27 DIAGNOSIS — M898X7 Other specified disorders of bone, ankle and foot: Secondary | ICD-10-CM

## 2020-11-27 DIAGNOSIS — S86312D Strain of muscle(s) and tendon(s) of peroneal muscle group at lower leg level, left leg, subsequent encounter: Secondary | ICD-10-CM | POA: Diagnosis not present

## 2020-11-27 DIAGNOSIS — Z9889 Other specified postprocedural states: Secondary | ICD-10-CM

## 2020-11-27 NOTE — Progress Notes (Signed)
She presents today for follow-up of her peroneal tendon repair.  She states that she still has some numbness and pain along the lateral malleolus and numbness extending along the length of her incision.  She was doing the contrast baths a few times until it got to be too painful for her so she stopped.  She continues to wear the boot at all times.  Objective: Vital signs are stable is alert and oriented x3.  There is no erythema mild edema no cellulitis drainage odor still has tenderness on palpation of the plantar inferior aspect of her fifth metatarsal base.  She has some mild tenderness on palpation of the peroneal retinaculum.  Assessment: Well-healing peroneal tendon.  Plan: At this point going go ahead and recommend that she move to a Tri-Lock brace I encouraged her to continue the contrast bath with a variation in temperatures.  I will follow-up with her in about 3 weeks to reevaluate she is to wear a Tri-Lock brace and tennis shoe at all times except for bedtime.

## 2020-11-28 ENCOUNTER — Ambulatory Visit (INDEPENDENT_AMBULATORY_CARE_PROVIDER_SITE_OTHER): Payer: 59

## 2020-11-28 VITALS — Temp 98.1°F

## 2020-11-28 DIAGNOSIS — Z23 Encounter for immunization: Secondary | ICD-10-CM | POA: Diagnosis not present

## 2020-11-29 ENCOUNTER — Telehealth: Payer: Self-pay | Admitting: Podiatry

## 2020-11-29 NOTE — Telephone Encounter (Signed)
Pt called and stated that her Tri-lock brace is causing pain and she needs Dr. Milinda Pointer or Caryl Pina to call.

## 2020-11-30 ENCOUNTER — Telehealth: Payer: Self-pay | Admitting: Podiatry

## 2020-11-30 NOTE — Telephone Encounter (Signed)
Pt left message yesterday morning @ 1036 stating she is not sure she is putting the trilock brace on correctly because she is having pain when wearing it.    I returned call today and left a message for pt to go to our website and at the bottom there is a how to link and she can watch the video on how to put on the brace. I also told pt to call if any further questions.

## 2020-12-18 ENCOUNTER — Ambulatory Visit (INDEPENDENT_AMBULATORY_CARE_PROVIDER_SITE_OTHER): Payer: 59 | Admitting: Podiatry

## 2020-12-18 ENCOUNTER — Other Ambulatory Visit: Payer: Self-pay

## 2020-12-18 DIAGNOSIS — S86312D Strain of muscle(s) and tendon(s) of peroneal muscle group at lower leg level, left leg, subsequent encounter: Secondary | ICD-10-CM

## 2020-12-18 DIAGNOSIS — Z9889 Other specified postprocedural states: Secondary | ICD-10-CM

## 2020-12-18 NOTE — Progress Notes (Signed)
She presents today for postop visit date of surgery October 2021.  She states that she still having pain most of its right and here as she points to the distal portion of the scar.  States that she gets zingers in the posterior aspect of the scar occasionally has continued with contrast baths which really do not seem to be helping very much she states that her Tri-Lock brace seems to bother her more than it does help she does feel stable with it yet.  She states that she is trying to walk normal heel-to-toe gait.  Notices some swelling laterally and dorsally.  Objective: Vital signs are stable she is alert and oriented x3.  Pulses are palpable.  There is no erythema edema cellulitis drainage or odor.  She has good range of motion however the skin is scarred down overlying the distalmost aspect of the incision.  Proximally the incision is loose and pliable and she has less pain in this area.  Assessment: Well-healing surgical foot slow to secondary to scar tissue development overlying her peroneal tendon.  Plan: At this point I recommend she discontinue the Tri-Lock brace utilize her compression anklet just for the tactile stimulation.  I also would like physical therapy to help her with balance training muscle training ambulation and strengthening as well as scar tissue mobilization.  I will follow-up with her once physical therapy has completed their treatment plan and she is scheduled to go to [facility] middle of February and we discussed wearing a type of hiking boot instead of any type of braces.

## 2020-12-24 ENCOUNTER — Other Ambulatory Visit: Payer: Self-pay | Admitting: Internal Medicine

## 2021-01-15 ENCOUNTER — Other Ambulatory Visit: Payer: Self-pay | Admitting: Internal Medicine

## 2021-01-15 DIAGNOSIS — F321 Major depressive disorder, single episode, moderate: Secondary | ICD-10-CM

## 2021-01-28 ENCOUNTER — Other Ambulatory Visit: Payer: Self-pay | Admitting: Internal Medicine

## 2021-02-12 ENCOUNTER — Encounter: Payer: Self-pay | Admitting: Podiatry

## 2021-02-12 ENCOUNTER — Other Ambulatory Visit: Payer: Self-pay

## 2021-02-12 ENCOUNTER — Ambulatory Visit: Payer: 59 | Admitting: Podiatry

## 2021-02-12 DIAGNOSIS — S86312D Strain of muscle(s) and tendon(s) of peroneal muscle group at lower leg level, left leg, subsequent encounter: Secondary | ICD-10-CM

## 2021-02-12 DIAGNOSIS — Z9889 Other specified postprocedural states: Secondary | ICD-10-CM

## 2021-02-12 DIAGNOSIS — M898X7 Other specified disorders of bone, ankle and foot: Secondary | ICD-10-CM | POA: Diagnosis not present

## 2021-02-12 NOTE — Progress Notes (Signed)
She presents today date of surgery 09/28/2020 primary repair of peroneus brevis tendon.  She also had an exostectomy of the 5th met base.  She states that physical therapy really helped her out particular with the Achilles tendon and these gastrosoleus complex that feels much better she is still experiencing some pain overlying the adhesed incision and she is also experiencing some pain at the 5th met base.  Objective: Vital signs are stable she is alert oriented x3.  There is no erythema edema cellulitis drainage or odor much decrease in edema and scar tissue at the 5th met base there is still moderately tender for her it is no longer palpable.  She also has considerable scar tissue that is scarred directly down to the peroneus brevis tendon he can feel it directly over the tendon itself at this point she still has good range of motion and increased range of motion on inversion and eversion or abduction and abduction.  Assessment: Well-healing surgical foot.  Plan: Due to the scar tissue I think that we need to give this a while probably at least 6 months before we injected but I do think that we need to consider injecting it and forcing steroid along the adhesion if not then we would need to open it up and redo that scar.  I recommend she continue her physical therapy at home. 

## 2021-03-05 ENCOUNTER — Ambulatory Visit: Payer: 59 | Admitting: Adult Health

## 2021-03-12 ENCOUNTER — Other Ambulatory Visit: Payer: Self-pay | Admitting: Internal Medicine

## 2021-04-02 ENCOUNTER — Other Ambulatory Visit: Payer: Self-pay

## 2021-04-02 ENCOUNTER — Encounter: Payer: Self-pay | Admitting: Podiatry

## 2021-04-02 ENCOUNTER — Ambulatory Visit: Payer: 59 | Admitting: Podiatry

## 2021-04-02 DIAGNOSIS — M898X7 Other specified disorders of bone, ankle and foot: Secondary | ICD-10-CM

## 2021-04-02 DIAGNOSIS — M722 Plantar fascial fibromatosis: Secondary | ICD-10-CM

## 2021-04-02 DIAGNOSIS — L905 Scar conditions and fibrosis of skin: Secondary | ICD-10-CM

## 2021-04-02 DIAGNOSIS — Z9889 Other specified postprocedural states: Secondary | ICD-10-CM

## 2021-04-02 DIAGNOSIS — R52 Pain, unspecified: Secondary | ICD-10-CM | POA: Diagnosis not present

## 2021-04-02 DIAGNOSIS — S86312D Strain of muscle(s) and tendon(s) of peroneal muscle group at lower leg level, left leg, subsequent encounter: Secondary | ICD-10-CM

## 2021-04-02 MED ORDER — TRIAMCINOLONE ACETONIDE 40 MG/ML IJ SUSP
40.0000 mg | Freq: Once | INTRAMUSCULAR | Status: AC
  Administered 2021-04-02: 40 mg

## 2021-04-02 NOTE — Progress Notes (Signed)
She presents today complaining of left heel pain and pain along the peroneal tendon repair site.  Scar is doing some better but has not improved considerably.  Objective: Vital signs are stable she is alert and oriented x3.  Pulses are palpable.  There is no erythema edema cellulitis drainage or odor she does have pain on palpation medial calcaneal tubercle of her left heel.  She also has tenderness on palpation with a thickened scar.  Assessment: Painful scar left ankle.  Plan fasciitis left foot.  Plan: Injected the left plantar fascial today 20 mg Kenalog 5 mg Marcaine point maximal tenderness.  Also injected the scar line today with 20 mg Kenalog 5 mg Marcaine.  She tolerated procedure well without complications.  I would like to follow-up with her in 6 to 8 weeks.

## 2021-04-11 ENCOUNTER — Other Ambulatory Visit: Payer: Self-pay | Admitting: Obstetrics & Gynecology

## 2021-04-11 NOTE — Telephone Encounter (Signed)
Medication refill request: prometrium 100mg  Last AEX:  05-11-2020 Next AEX: 05-14-2021 Last MMG (if hormonal medication request): 09-17-2020 category d density birads 1:neg Refill authorized: please approve to get patient to annual exam if appropriate

## 2021-04-16 ENCOUNTER — Ambulatory Visit: Payer: 59 | Admitting: Podiatry

## 2021-04-22 ENCOUNTER — Other Ambulatory Visit: Payer: Self-pay | Admitting: Internal Medicine

## 2021-05-02 ENCOUNTER — Encounter: Payer: Self-pay | Admitting: Podiatry

## 2021-05-02 ENCOUNTER — Other Ambulatory Visit: Payer: Self-pay

## 2021-05-02 ENCOUNTER — Ambulatory Visit: Payer: 59 | Admitting: Podiatry

## 2021-05-02 DIAGNOSIS — L905 Scar conditions and fibrosis of skin: Secondary | ICD-10-CM

## 2021-05-02 DIAGNOSIS — R52 Pain, unspecified: Secondary | ICD-10-CM

## 2021-05-05 NOTE — Progress Notes (Signed)
She presents today for follow-up of her peroneal tendon repair.  States that the plantar fascial starting to bother her as well at this point states that the injection to the scar did not seem to help last time.  Objective: Vital signs are stable she alert oriented x3 hurting in the same place along the lateral aspect of the left foot.  Along the scar.  Plan: Discussed etiology pathology and surgical therapies at this point time I did discuss with her releasing the scar manually as far as opening up the skin just a little bit and inserting a device to help break the scar free from the underlying peroneal tendon she understands and is amenable to would like to go ahead and schedule that so she is scheduled for 526 which is next Thursday at for p.m.

## 2021-05-09 ENCOUNTER — Ambulatory Visit: Payer: 59 | Admitting: Podiatry

## 2021-05-09 ENCOUNTER — Other Ambulatory Visit: Payer: Self-pay

## 2021-05-09 DIAGNOSIS — L910 Hypertrophic scar: Secondary | ICD-10-CM

## 2021-05-12 NOTE — Progress Notes (Signed)
She presents today for follow-up of her painful scar peroneal tendon repair left.  Today we are going to consent her for a peroneal tenolysis.  This should help release the scar.  Objective: Vital signs are stable alert and oriented x3.  She still has tenderness and tenderness over the scar area is quite tender on palpation.  She has limited inversion and eversion due to the pulling of the scar on the tendon itself.  Assessment painful adhesion of the skin to the peroneal brevis tendon.  Plan: Consent was signed today for tenolysis of the tendon.  I delivered 10 cc of a 50-50 mixture Marcaine plain lidocaine with epinephrine around the fibular malleolus extending to the foot.  Once this was I then prepped the foot in a normal sterile fashion.  I made a small incision in the proximal aspect of the scar just beneath the lateral malleolus..  I then introduced a small hemostat to make a small area in the subcutaneous tissue so that I could introduce a set of [place].  I then introduced a [place] surrounding them down the entire length of the incision primarily spreading the scar tissue away from the tendon separating the 2 occasionally I did have to cut some of the scar tissue blindly.  At this point once we were to the level of the fifth metatarsal it was definite that she could see the release of the peroneus brevis tendon from its adhesion on the underside of the scar.  Where is the 2 had been running as 1 from proximal to distal we could then see the scar and the tendon separately.  The area was copiously washed with normal sterile saline I then placed 4 simple stitches at the incision site with a 4-0 nylon.  Dressed with a dry sterile compressive dressing we will follow-up with her in 1 week encouraged range of motion exercises as much as possible.  She will keep this clean and dry.

## 2021-05-14 ENCOUNTER — Other Ambulatory Visit: Payer: Self-pay | Admitting: Internal Medicine

## 2021-05-14 ENCOUNTER — Encounter: Payer: Self-pay | Admitting: Obstetrics & Gynecology

## 2021-05-14 ENCOUNTER — Ambulatory Visit (INDEPENDENT_AMBULATORY_CARE_PROVIDER_SITE_OTHER): Payer: 59 | Admitting: Obstetrics & Gynecology

## 2021-05-14 ENCOUNTER — Other Ambulatory Visit: Payer: Self-pay

## 2021-05-14 VITALS — BP 130/80 | Ht 63.5 in | Wt 166.0 lb

## 2021-05-14 DIAGNOSIS — K219 Gastro-esophageal reflux disease without esophagitis: Secondary | ICD-10-CM

## 2021-05-14 DIAGNOSIS — M8589 Other specified disorders of bone density and structure, multiple sites: Secondary | ICD-10-CM

## 2021-05-14 DIAGNOSIS — Z79890 Hormone replacement therapy (postmenopausal): Secondary | ICD-10-CM

## 2021-05-14 DIAGNOSIS — Z01419 Encounter for gynecological examination (general) (routine) without abnormal findings: Secondary | ICD-10-CM

## 2021-05-14 MED ORDER — ESTRADIOL 1 MG PO TABS: 0.5000 mg | ORAL_TABLET | Freq: Every day | ORAL | 4 refills | Status: DC

## 2021-05-14 MED ORDER — PROGESTERONE MICRONIZED 100 MG PO CAPS: 100.0000 mg | ORAL_CAPSULE | Freq: Every day | ORAL | 4 refills | Status: DC

## 2021-05-14 NOTE — Progress Notes (Signed)
Beth Miranda July 21, 1961 [id number]   History:    60 y.o. G0 Married  BD:[id number] presenting for annual gyn exam   HPI: Postmenopause, well on HRTx 9 yrs, Estradiol 0.5 mg PO daily HS last year and Prometrium 100 mg PO daily HS. Rare hot flushes. No PMB. No pelvic pain. Abstinent. Pap neg 2020. Breasts normal.  Screening mammo 09/2020 Neg.  Urine/BMs normal. BMI increased to 28.94. Good nutrition. Not exercising regularly. Health labs with Fam MD.  Beth Miranda next year.  Bone Density 05/2020 very mild osteopenia.  Normal grieving process for mother who died 2020/04/28.   Past medical history,surgical history, family history and social history were all reviewed and documented in the EPIC chart.  Gynecologic History Patient's last menstrual period was 03/02/2013.  Obstetric History OB History  Gravida Para Term Preterm AB Living  0 0 0 0 0 0  SAB IAB Ectopic Multiple Live Births  0 0 0 0       ROS: A ROS was performed and pertinent positives and negatives are included in the history.  GENERAL: No fevers or chills. HEENT: No change in vision, no earache, sore throat or sinus congestion. NECK: No pain or stiffness. CARDIOVASCULAR: No chest pain or pressure. No palpitations. PULMONARY: No shortness of breath, cough or wheeze. GASTROINTESTINAL: No abdominal pain, nausea, vomiting or diarrhea, melena or bright red blood per rectum. GENITOURINARY: No urinary frequency, urgency, hesitancy or dysuria. MUSCULOSKELETAL: No joint or muscle pain, no back pain, no recent trauma. DERMATOLOGIC: No rash, no itching, no lesions. ENDOCRINE: No polyuria, polydipsia, no heat or cold intolerance. No recent change in weight. HEMATOLOGICAL: No anemia or easy bruising or bleeding. NEUROLOGIC: No headache, seizures, numbness, tingling or weakness. PSYCHIATRIC: No depression, no loss of interest in normal activity or change in sleep pattern.     Exam:   BP 130/80   Ht 5' 3.5" (1.613 m)    Wt 166 lb (75.3 kg)   LMP 03/02/2013   BMI 28.94 kg/m   Body mass index is 28.94 kg/m.  General appearance : Well developed well nourished female. No acute distress HEENT: Eyes: no retinal hemorrhage or exudates,  Neck supple, trachea midline, no carotid bruits, no thyroidmegaly Lungs: Clear to auscultation, no rhonchi or wheezes, or rib retractions  Heart: Regular rate and rhythm, no murmurs or gallops Breast:Examined in sitting and supine position were symmetrical in appearance, no palpable masses or tenderness,  no skin retraction, no nipple inversion, no nipple discharge, no skin discoloration, no axillary or supraclavicular lymphadenopathy Abdomen: no palpable masses or tenderness, no rebound or guarding Extremities: no edema or skin discoloration or tenderness  Pelvic: Vulva: Normal             Vagina: No gross lesions or discharge  Cervix: No gross lesions or discharge  Uterus  AV, normal size, shape and consistency, non-tender and mobile  Adnexa  Without masses or tenderness  Anus: Normal   Assessment/Plan:  60 y.o. female for annual exam   1. Well female exam with routine gynecological exam Normal gynecologic exam.  Last Pap test May 2020 was negative, will repeat at 3 years.  Breast exam normal.  Screening mammogram October 2021 was negative.  We will do a colonoscopy next year.  Health labs with family physician.  Body mass index 28.94.  Continue with fitness and healthy nutrition.  2. Postmenopausal hormone replacement therapy Well on hormone replacement therapy for 9 years.  We will try to wean progressively this year.  Estradiol 0.5 mg daily at this point we will try spacing to every 2 days and then stop.  We will continue progesterone 100 mg daily at bedtime until stops completely with estrogen.  No postmenopausal bleeding.  3. Osteopenia of multiple sites Very mild osteopenia on bone density June 2021.  Will repeat at 2 to 3 years.  Other orders - progesterone  (PROMETRIUM) 100 MG capsule; Take 1 capsule (100 mg total) by mouth at bedtime. - estradiol (ESTRACE) 1 MG tablet; Take 0.5 tablets (0.5 mg total) by mouth daily.  Princess Bruins MD, 2:27 PM 05/14/2021

## 2021-05-16 ENCOUNTER — Other Ambulatory Visit: Payer: Self-pay

## 2021-05-16 ENCOUNTER — Ambulatory Visit: Payer: 59 | Admitting: Podiatry

## 2021-05-16 DIAGNOSIS — L905 Scar conditions and fibrosis of skin: Secondary | ICD-10-CM

## 2021-05-16 DIAGNOSIS — L910 Hypertrophic scar: Secondary | ICD-10-CM

## 2021-05-16 DIAGNOSIS — Z9889 Other specified postprocedural states: Secondary | ICD-10-CM

## 2021-05-16 DIAGNOSIS — R52 Pain, unspecified: Secondary | ICD-10-CM

## 2021-05-18 NOTE — Progress Notes (Signed)
She presents today for follow-up of her tenolysis of her peroneal tendon and scar tissue.  She states that there is some tenderness at all like that she eats normal shoes to touch this area as she refers to the dorsal lateral aspect near the incision site.  She states that is very very sensitive and because of this she feels that there is a lack of range of motion.  She states that it does not hurt where it did hurt prior to [place] performing this procedure.  Objective: Light dressing removed demonstrates sutures are intact margins are coapting but still have a ways to go.  There is no erythema just some mild edema no cellulitis drainage or odor.  Tenderness along the course of the old incision is much less than previous notation.  The majority of her pain is just at the anterior talofibular ligament area where her incision was made.  Assessment: Tenolysis peroneal tendon left.  Plan: I we will follow-up with her in 1 week she will continue conservative therapies and range of motion exercises and medication as needed.

## 2021-05-23 ENCOUNTER — Ambulatory Visit: Payer: 59 | Admitting: Podiatry

## 2021-05-23 ENCOUNTER — Encounter: Payer: Self-pay | Admitting: Podiatry

## 2021-05-23 ENCOUNTER — Other Ambulatory Visit: Payer: Self-pay

## 2021-05-23 DIAGNOSIS — Z9889 Other specified postprocedural states: Secondary | ICD-10-CM

## 2021-05-23 DIAGNOSIS — R52 Pain, unspecified: Secondary | ICD-10-CM

## 2021-05-23 DIAGNOSIS — L905 Scar conditions and fibrosis of skin: Secondary | ICD-10-CM

## 2021-05-23 DIAGNOSIS — L910 Hypertrophic scar: Secondary | ICD-10-CM

## 2021-05-26 NOTE — Progress Notes (Signed)
She presents today for suture removal after tenolysis in the office of the peroneus brevis and scar tissue.  She seems to be doing quite well.  States that the tissue is starting to feel better and she is having less pulling.  Objective: Vital signs are stable she is alert and oriented x3.  Pulses are palpable.  Some mild ecchymosis no signs of infections sutures are intact margins well coapted.  Assessment: Well-healing surgical foot.  Plan: Remove all of the sutures today placed her in a small tape dressing.  Allow her to start getting this wet over the next few days Steri-Strips also replaced.  Follow-up with her in a couple weeks

## 2021-05-30 ENCOUNTER — Telehealth: Payer: Self-pay | Admitting: *Deleted

## 2021-05-30 NOTE — Telephone Encounter (Signed)
Patient is calling and has fallen and  injured her left foot 1 day ago. Please schedule as soon as possible.

## 2021-06-07 ENCOUNTER — Other Ambulatory Visit: Payer: Self-pay | Admitting: Internal Medicine

## 2021-06-07 DIAGNOSIS — F321 Major depressive disorder, single episode, moderate: Secondary | ICD-10-CM

## 2021-06-11 ENCOUNTER — Ambulatory Visit: Payer: 59 | Admitting: Podiatry

## 2021-06-11 ENCOUNTER — Other Ambulatory Visit: Payer: Self-pay

## 2021-06-11 DIAGNOSIS — L905 Scar conditions and fibrosis of skin: Secondary | ICD-10-CM

## 2021-06-11 DIAGNOSIS — L910 Hypertrophic scar: Secondary | ICD-10-CM

## 2021-06-11 DIAGNOSIS — R52 Pain, unspecified: Secondary | ICD-10-CM

## 2021-06-12 NOTE — Progress Notes (Signed)
She presents today for follow-up of her thickened scar and the release of that tendon.  She states that she feels some pulling with rotation of her foot and states that the scab fell off a couple of days ago.  Other than that she says she has some tenderness and some areas that are still quite sore but she states that is a vast improvement from where it was prior to surgery and prior to [place] releasing the tendon.  Objective: Vital signs are stable alert oriented x3 still has some ecchymosis and there is ecchymotic areas are tender with some surrounding numbness.  The incision site appears to be healing with no purulence no malodor.  She still has a very nice palpable tendon and much decreased tenderness on palpation of the fifth metatarsal.  Assessment: Well-healing release of her peroneal tendon and scar tissue.  Plan: I would like to follow-up with her in 2 months or on an as-needed basis.

## 2021-07-16 ENCOUNTER — Ambulatory Visit: Payer: 59 | Admitting: Podiatry

## 2021-07-16 ENCOUNTER — Other Ambulatory Visit: Payer: Self-pay

## 2021-07-16 ENCOUNTER — Encounter: Payer: Self-pay | Admitting: Podiatry

## 2021-07-16 DIAGNOSIS — L910 Hypertrophic scar: Secondary | ICD-10-CM

## 2021-07-16 DIAGNOSIS — Z9889 Other specified postprocedural states: Secondary | ICD-10-CM

## 2021-07-16 DIAGNOSIS — R52 Pain, unspecified: Secondary | ICD-10-CM

## 2021-07-16 DIAGNOSIS — L905 Scar conditions and fibrosis of skin: Secondary | ICD-10-CM

## 2021-07-16 NOTE — Progress Notes (Signed)
She presents today for follow-up of the release of the scar to the lateral aspect of the left foot.  She states that is doing just great no problems whatsoever.  Objective: There is no erythema edema cellulitis drainage odor appears to be healing very nicely.  She has great range of motion abduction against resistance tendon is easily palpated and seen.  No tenderness on palpation.  Assessment: Well-healing surgical foot left.  Plan: Follow-up with [place] as needed.

## 2021-08-26 ENCOUNTER — Other Ambulatory Visit: Payer: Self-pay | Admitting: Internal Medicine

## 2021-08-26 MED ORDER — LEVOTHYROXINE SODIUM 50 MCG PO TABS: ORAL_TABLET | ORAL | 1 refills | Status: DC

## 2021-09-04 ENCOUNTER — Ambulatory Visit: Payer: 59 | Admitting: Adult Health

## 2021-09-04 ENCOUNTER — Other Ambulatory Visit: Payer: Self-pay

## 2021-09-04 ENCOUNTER — Encounter: Payer: Self-pay | Admitting: Adult Health

## 2021-09-04 VITALS — BP 122/82 | HR 70 | Temp 97.7°F | Wt 171.0 lb

## 2021-09-04 DIAGNOSIS — G44311 Acute post-traumatic headache, intractable: Secondary | ICD-10-CM

## 2021-09-04 DIAGNOSIS — S0990XA Unspecified injury of head, initial encounter: Secondary | ICD-10-CM

## 2021-09-04 NOTE — Progress Notes (Signed)
Assessment and Plan:  Beth Miranda was seen today for head injury.  Diagnoses and all orders for this visit:  Injury of head, initial encounter Intractable acute post-traumatic headache No LOC but HA with dizziness/nausea that improved and now recurrent; neuro otherwise intact on exam but will proceed with STAT CT at soonest opportunity in AM to r/o bleed/contusion. Strict ER precautions given if any worse overnight Hold ASA -  -     CT HEAD WO CONTRAST (5MM); Future  Further disposition pending results of labs. Discussed med's effects and SE's.   Over 20 minutes of exam, counseling, chart review, and critical decision making was performed.   Future Appointments  Date Time Provider [place]  09/05/2021  9:45 AM [place], [place] T, [place] [facility]  09/18/2021 11:00 AM Unk Pinto, MD [name]  05/15/2022  2:00 PM Princess Bruins, MD [name] None    ------------------------------------------------------------------------------------------------------------------   HPI BP 122/82   Pulse 70   Temp 97.7 F (36.5 C)   Wt 171 lb (77.6 kg)   LMP 03/02/2013   SpO2 99%   BMI 29.82 kg/m  60 y.o.female presents for evaluation following fall 1 week ago with head injury.   She reports 1 week ago she triped/slipped backwards in her basement and landed hard on her left hip then slammed the back of head on wood on concrete flood. No loss of consciousness with with immediate headache, occipital tenderness/swelling. She reports very sore neck, hurt to turn for a while but now back to baseline after day 4. Had severe HA that night, very tender to back of head, lasted for several days but did respond well to tylenol, then did resolve for a few days on Monday/Tuesday but presents for evaluation due to nagging headache returning. She reports had dizziness that lasted 2-3 days, some nausea but not emesis. Denies photosensitivity, otosensitivity, tinnitis, ear discharge, blurry vision,  double vision, confusion, memory changes, changes in sensation or weakness.   She is on bASA daily.   Past Medical History:  Diagnosis Date   Allergy    seasonal   GERD (gastroesophageal reflux disease)    Hyperlipidemia    Labile hypertension    Prediabetes    Raynaud's disease    Thyroid disease    hypothyroid   Vitamin D deficiency      Allergies  Allergen Reactions   Penicillins Hives   Sulfa Antibiotics Hives    Current Outpatient Medications on File Prior to Visit  Medication Sig   aspirin 81 MG tablet Take 81 mg by mouth daily.   betamethasone dipropionate 0.05 % cream Apply 1 application topically 3 (three) times daily.   Cetirizine HCl (ZYRTEC ALLERGY PO) Take 10 mg by mouth at bedtime.   Cholecalciferol (VITAMIN D) 2000 UNITS tablet Take 2,000 Units by mouth daily.    Cinnamon 500 MG capsule Take 2,000 mg by mouth daily.   Cyanocobalamin (VITAMIN B 12 PO) Take 1,000 mcg by mouth daily.   escitalopram (LEXAPRO) 20 MG tablet TAKE ONE TABLET BY MOUTH ONCE DAILY.   estradiol (ESTRACE) 1 MG tablet Take 0.5 tablets (0.5 mg total) by mouth daily.   gabapentin (NEURONTIN) 300 MG capsule TAKE 1 TO 2 CAPSULES 2 TO 3 TIMES DAILY AS NEEDED FOR HOT FLASHES.   levothyroxine (SYNTHROID) 50 MCG tablet Take  1 tablet  Daily  on an empty stomach with only water for 30 minutes & no Antacid meds, Calcium or Magnesium for 4 hours & avoid Biotin       /  TAKE ONE TABLET BY MOUTH ONCE DAILY.   Magnesium 400 MG TABS Take 1 tablet by mouth 2 (two) times daily.   Omega-3 Fatty Acids (OMEGA-3 FISH OIL) 1200 MG CAPS Take 2 capsules by mouth daily.   pantoprazole (PROTONIX) 40 MG tablet TAKE ONE TABLET BY MOUTH ONCE DAILY.   progesterone (PROMETRIUM) 100 MG capsule Take 1 capsule (100 mg total) by mouth at bedtime.   rosuvastatin (CRESTOR) 20 MG tablet TAKE (1) TABLET BY MOUTH ONCE DAILY.   clindamycin (CLEOCIN) 150 MG capsule Take 1 capsule (150 mg total) by mouth 3 (three) times daily.    doxycycline (VIBRA-TABS) 100 MG tablet Take 100 mg by mouth 2 (two) times daily.   No current facility-administered medications on file prior to visit.    ROS: all negative except above.   Physical Exam:  BP 122/82   Pulse 70   Temp 97.7 F (36.5 C)   Wt 171 lb (77.6 kg)   LMP 03/02/2013   SpO2 99%   BMI 29.82 kg/m   General Appearance: Well nourished, in no apparent distress. Eyes: PERRLA, EOMs, conjunctiva no swelling or erythema Sinuses: No Frontal/maxillary tenderness ENT/Mouth: Ext aud canals clear, TMs without erythema, bulging. No erythema, swelling, or exudate on post pharynx.  Tonsils not swollen or erythematous. Hearing normal.  Neck: Supple, full ROM intact.   Respiratory: Respiratory effort normal Cardio: RRR with no MRGs. Brisk peripheral pulses without edema.  Abdomen: Soft, + BS.  Non tender, no guarding. Lymphatics: Non tender without lymphadenopathy.  Musculoskeletal: Full ROM cervical, symmetrical extremity strength, No body abnormality or tenderness of hip, normal gait.  Skin: Warm, dry without rashes, lesions, ecchymosis.  Neuro: Cranial nerves intact. Normal muscle tone, no cerebellar symptoms. Sensation intact.  Psych: Awake and oriented X 3, anxious affect, Insight and Judgment appropriate.    Izora Ribas, NP 8:01 AM [facility]

## 2021-09-04 NOTE — Patient Instructions (Signed)
Will order CT scan for tomorrow morning-  IF ANY CHANGES TONIGHT PLEASE GO TO ED  Please call our office at [phone] and ask to speak to [place] once you are finishing up with your appointment with Dr. Milinda Pointer and ready to head to your CT scan  Head Injury, Adult There are many types of head injuries. Head injuries can be as minor as a small bump, or they can be a serious medical issue. More severe head injuries include: A jarring injury to the brain (concussion). A bruise (contusion) of the brain. This means there is bleeding in the brain that can cause swelling. A cracked skull (skull fracture). Bleeding in the brain that collects, clots, and forms a bump (hematoma). After a head injury, most problems occur within the first 24 hours, but side effects may occur up to 7-10 days after the injury. It is important to watch your condition for any changes. You may need to be observed in the emergency department or urgent care, or you may be admitted to the hospital. What are the causes? There are many possible causes of a head injury. Serious head injuries may be caused by car accidents, bicycle or motorcycle accidents, sports injuries, falls, or being struck by an object. What are the symptoms? Symptoms of a head injury include a contusion, bump, or bleeding at the site of the injury. Other physical symptoms may include: Headache. Nausea or vomiting. Dizziness. Blurred or double vision. Being uncomfortable around bright lights or loud noises. Seizures. Feeling tired. Trouble being awakened. Loss of consciousness. Mental or emotional symptoms may include: Irritability. Confusion and memory problems. Poor attention and concentration. Changes in eating or sleeping habits. Anxiety or depression. How is this diagnosed? This condition can usually be diagnosed based on your symptoms, a description of the injury, and a physical exam. You may also have imaging tests done, such as a CT scan or an  MRI. How is this treated? Treatment for this condition depends on the severity and type of injury you have. The main goal of treatment is to prevent complications and allow the brain time to heal. Mild head injury If you have a mild head injury, you may be sent home, and treatment may include: Observation. A responsible adult should stay with you for 24 hours after your injury and check on you often. Physical rest. Brain rest. Pain medicines. Severe head injury If you have a severe head injury, treatment may include: Close observation. This includes hospitalization with the following care: Frequent physical exams. Frequent checks of how your brain and nervous system are working (neurological status). Checking your blood pressure and oxygen levels. Medicines to relieve pain, prevent seizures, and decrease brain swelling. Airway protection and breathing support. This may include using a ventilator. Treatments that monitor and manage swelling inside the brain. Brain surgery. This may be needed to: Remove a collection of blood or blood clots. Stop the bleeding. Remove a part of the skull to allow room for the brain to swell. Follow these instructions at home: Activity Rest and avoid activities that are physically hard or tiring. Make sure you get enough sleep. Let your brain rest by limiting activities that require a lot of thought or attention, such as: Watching TV. Playing memory games and puzzles. Job-related work or homework. Working on [employer], and texting. Avoid activities that could cause another head injury, such as playing sports, until your health care provider approves. Having another head injury, especially before the first one  has healed, can be dangerous. Ask your health care provider when it is safe for you to return to your regular activities, including work or school. Ask your health care provider for a step-by-step plan for gradually returning to  activities. Ask your health care provider when you can drive, ride a bicycle, or use heavy machinery. Your ability to react may be slower after a brain injury. Do not do these activities if you are dizzy. Lifestyle  Do not drink alcohol until your health care provider approves. Do not use drugs. Alcohol and certain drugs may slow your recovery and can put you at risk of further injury. If it is harder than usual to remember things, write them down. If you are easily distracted, try to do one thing at a time. Talk with family members or close friends when making important decisions. Tell your friends, family, a trusted colleague, and work Freight forwarder about your injury, symptoms, and restrictions. Have them watch for any new or worsening problems. General instructions Take over-the-counter and prescription medicines only as told by your health care provider. Have someone stay with you for 24 hours after your head injury. This person should watch you for any changes in your symptoms and be ready to seek medical help. Keep all follow-up visits as told by your health care provider. This is important. How is this prevented? Work on improving your balance and strength to avoid falls. Wear a seat belt when you are in a moving vehicle. Wear a helmet when riding a bicycle, skiing, or doing any other sport or activity that has a risk of injury. If you drink alcohol: Limit how much you use to: 0-1 drink a day for nonpregnant women. 0-2 drinks a day for men. Be aware of how much alcohol is in your drink. In the [place] one drink equals one 12 oz bottle of beer (355 mL), one 5 oz glass of wine (148 mL), or one 1 oz glass of hard liquor (44 mL). Take safety measures in your home, such as: Removing clutter and tripping hazards from floors and stairways. Using grab bars in bathrooms and handrails by stairs. Placing non-slip mats on floors and in bathtubs. Improving lighting in dim areas. Where to find more  information Centers for Disease Control and Prevention: http://www.wolf.info/ Get help right away if: You have: A severe headache that is not helped by medicine. Trouble walking or weakness in your arms and legs. Clear or bloody fluid coming from your nose or ears. Changes in your vision. A seizure. Increased confusion or irritability. Your symptoms get worse. You are sleepier than normal and have trouble staying awake. You lose your balance. Your pupils change size. Your speech is slurred. Your dizziness gets worse. You vomit. These symptoms may represent a serious problem that is an emergency. Do not wait to see if the symptoms will go away. Get medical help right away. Call your local emergency services (911 in the [place] Do not drive yourself to the hospital. Summary Head injuries can be minor, or they can be a serious medical issue requiring immediate attention. Treatment for this condition depends on the severity and type of injury you have. Have someone stay with you for 24 hours after your injury and check on you often. Ask your health care provider when it is safe for you to return to your regular activities, including work or school. Head injury prevention includes wearing a seat belt in a motor vehicle, using a helmet on a bicycle, limiting  alcohol use, and taking safety measures in your home. This information is not intended to replace advice given to you by your health care provider. Make sure you discuss any questions you have with your health care provider. Document Revised: 10/14/2019 Document Reviewed: 10/14/2019 Elsevier Patient Education  2022 [employer].

## 2021-09-05 ENCOUNTER — Ambulatory Visit (INDEPENDENT_AMBULATORY_CARE_PROVIDER_SITE_OTHER): Payer: 59

## 2021-09-05 ENCOUNTER — Encounter: Payer: Self-pay | Admitting: Podiatry

## 2021-09-05 ENCOUNTER — Ambulatory Visit (HOSPITAL_BASED_OUTPATIENT_CLINIC_OR_DEPARTMENT_OTHER)
Admission: RE | Admit: 2021-09-05 | Discharge: 2021-09-05 | Disposition: A | Discharge status: Home / Self Care | Payer: 59 | Source: Ambulatory Visit | Attending: Adult Health | Admitting: Adult Health

## 2021-09-05 ENCOUNTER — Ambulatory Visit: Payer: 59 | Admitting: Podiatry

## 2021-09-05 ENCOUNTER — Encounter: Payer: Self-pay | Admitting: Adult Health

## 2021-09-05 DIAGNOSIS — G44311 Acute post-traumatic headache, intractable: Secondary | ICD-10-CM | POA: Diagnosis present

## 2021-09-05 DIAGNOSIS — S0990XA Unspecified injury of head, initial encounter: Secondary | ICD-10-CM

## 2021-09-05 DIAGNOSIS — M722 Plantar fascial fibromatosis: Secondary | ICD-10-CM | POA: Diagnosis not present

## 2021-09-05 DIAGNOSIS — I6789 Other cerebrovascular disease: Secondary | ICD-10-CM | POA: Insufficient documentation

## 2021-09-05 MED ORDER — TRIAMCINOLONE ACETONIDE 40 MG/ML IJ SUSP
20.0000 mg | Freq: Once | INTRAMUSCULAR | Status: AC
  Administered 2021-09-05: 20 mg

## 2021-09-05 MED ORDER — METHYLPREDNISOLONE 4 MG PO TBPK: ORAL_TABLET | ORAL | 0 refills | Status: DC

## 2021-09-05 MED ORDER — MELOXICAM 15 MG PO TABS: 15.0000 mg | ORAL_TABLET | Freq: Every day | ORAL | 3 refills | Status: DC

## 2021-09-05 NOTE — Progress Notes (Signed)
She presents today with a new problem regarding her left heel.  She has developed pain along the medial aspect of the plantar heel left over the past 3 weeks or so denies any trauma.  Though she does state that she fell a week or so ago hitting her head and development of concussion.  She has a small stitch that continues to bother her on the dorsal lateral aspect of her left foot.  Objective: Vital signs are stable alert oriented x3 pulses are palpable.  She is pain on palpation medial calcaneal tubercle left heel she has no pain on medial lateral compression of the calcaneus.  Small palpable stitch on the fifth metatarsal base dorsally does not appear to be infected does not appear to be any foreign object.  Not visible on radiographs.  Radiographs taken today demonstrate an osseously mature individual no significant osseous abnormalities other than soft tissue increase in density plantar fascial pain surgical right of the heel.  Assessment: Planter fasciitis retention of a painful stitch left.  Plan: Started on Medrol Dosepak followed by meloxicam I injected the left heel today 20 mg Kenalog, grams Marcaine point of maximal tenderness.  Tolerated procedure well without complications.  Placed her in a plantar fascial brace and a night splint.  Discussed appropriate shoe gear stretching size ice therapy shoe gear modifications.  Next month when she comes in we will numb the area up around the small retained foreign body and remove it.

## 2021-09-16 ENCOUNTER — Other Ambulatory Visit: Payer: Self-pay | Admitting: Internal Medicine

## 2021-09-16 DIAGNOSIS — Z1231 Encounter for screening mammogram for malignant neoplasm of breast: Secondary | ICD-10-CM

## 2021-09-17 NOTE — Progress Notes (Signed)
Annual Screening/Preventative Visit & Comprehensive Evaluation &  Examination  Future Appointments  Date Time Provider [place]  09/18/2021 11:00 AM Unk Pinto, MD [name]  10/10/2021  4:00 PM [place] T, DPM [facility]  05/15/2022  2:00 PM Princess Bruins, MD [name] None        This very nice 60 y.o. MWF presents for a Screening /Preventative Visit & comprehensive evaluation and management of multiple medical co-morbidities.  Patient has been followed for labile HTN, HLD, Prediabetes  and Vitamin D Deficiency. Patient's GERD is controlled on her meds.    [[ Copied:  Patient has remote  hx/o sero(+) Mixed Connective Disease Syndrome dating back to the 1980's & w/associated Raynauds' syndrome which has been in remission for many years.  ]]         Labile HTN predates circa 2000.  Patient's BP has been controlled at home and patient denies any cardiac symptoms as chest pain, palpitations, shortness of breath, dizziness or ankle swelling. Today's BP: 136/78        Patient's hyperlipidemia is controlled with diet and Rosuvastatin. Patient denies myalgias or other medication SE's. Last lipids were not at goal:  Lab Results  Component Value Date   CHOL 205 (H) 08/30/2020   HDL 79 08/30/2020   LDLCALC 110 (H) 08/30/2020   TRIG 72 08/30/2020   CHOLHDL 2.6 08/30/2020         Patient has hx/o prediabetes (A1c 6.0% /2012 & 5.9% /2016)  and patient denies reactive hypoglycemic symptoms, visual blurring, diabetic polys or paresthesias. Last A1c was normal & at goal:  Lab Results  Component Value Date   HGBA1C 5.5 08/30/2020    In 2010, Patient was dx'd Hypothyroid & initiated on replacement  therapy.         Patient has history of Vitamin D Deficiency and last Vitamin D was   Lab Results  Component Value Date   VD25OH 84 08/30/2020     Current Outpatient Medications on File Prior to Visit  Medication Sig   aspirin 81 MG tablet Take  daily.    betamethasone 0.05 % crm Apply 1 application topically 3  times daily.   Cetirizine 10 mg  Take 1 at bedtime.   VITAMIN D 2000 u Take  daily.    Cinnamon 500 MG capsule Take 2,000 mg daily.   VITAMIN B 12 1,000 mcg  Take daily.   Escitalopram  20 MG tablet TAKE ONE TABLET DAILY.   estradiol 1 MG tablet Take 0.5 tablets  daily.   Famotidine 10  MG Chew 1 tablet  daily as needed.   gabapentin  300 MG capsule TAKE 1 TO 2 CAPS 2 TO 3 TIMES DAILY    levothyroxine 50 MCG tablet Take  1 tablet  Daily     Magnesium 400 MG TABS Take 1 tablet 2  times daily.   meloxicam  15 MG tablet Take 1 tablet daily   Omega-3 FISH OIL 1200 MG  Take 2 capsules  daily   pantoprazole  40 MG tablet TAKE ONE TABLET DAILY   progesterone 100 MG capsule Take 1 capsule at bedtime.   rosuvastatin  20 MG tablet TAKE (1) TABLET  DAILY.     Allergies  Allergen Reactions   Penicillins Hives   Sulfa Antibiotics Hives     Past Medical History:  Diagnosis Date   Allergy    seasonal   GERD (gastroesophageal reflux disease)    Hyperlipidemia    Labile  hypertension    Prediabetes    Raynaud's disease    Thyroid disease    hypothyroid   Vitamin D deficiency      Health Maintenance  Topic Date Due   COVID-19 Vaccine (3 - Pfizer risk series) 04/08/2020   INFLUENZA VACCINE  07/15/2021   MAMMOGRAM  09/17/2021   PAP SMEAR-Modifier  05/09/2022   COLONOSCOPY  05/28/2022   TETANUS/TDAP  07/02/2027   Hepatitis C Screening  Completed   HIV Screening  Completed   Zoster Vaccines- Shingrix  Completed   HPV VACCINES  Aged Out     Immunization History  Administered Date(s) Administered   Hepatitis B 06/06/2002, 07/07/2002, 12/29/2002   Influenza Inj Mdck Quad  11/28/2020   Influenza 10/14/2018   PFIZER SARS-COV-2 Vacc 02/19/2020, 03/11/2020   PPD Test 07/26/2018, 08/15/2019, 08/30/2020   Pneumococcal-23 12/15/1992   Td 01/06/2007   Tdap 07/01/2017   Zoster Recombinat (Shingrix) 03/01/2018, 05/21/2018     Last Colon - 05/28/2012 -  Dr Deatra Ina - Recc 10 year f/u due June 2023   [facility] - Next scheduled for 10/15/2021   Past Surgical History:  Procedure Laterality Date   FOOT SURGERY  09/28/2020   TENDON REPAIR    TONSILLECTOMY       Family History  Problem Relation Age of Onset   Pancreatic cancer Paternal Grandmother    Arthritis Mother        Living   COPD Mother    Asthma Mother    Hypertension Father        Living   Diabetes Father    Kidney disease Father        Stones   Hyperlipidemia Father    Other Brother        Deceased   Breast cancer Neg Hx      Social History   Tobacco Use   Smoking status: Never   Smokeless tobacco: Never  Vaping Use   Vaping Use: Never used  Substance Use Topics   Alcohol use: Yes    Alcohol/week: 1.0 standard drink    Types: 1 Standard drinks or equivalent per week    Comment: 2 glasses  per week   Drug use: No      ROS Constitutional: Denies fever, chills, weight loss/gain, headaches, insomnia,  night sweats, and change in appetite. Does c/o fatigue. Eyes: Denies redness, blurred vision, diplopia, discharge, itchy, watery eyes.  ENT: Denies discharge, congestion, post nasal drip, epistaxis, sore throat, earache, hearing loss, dental pain, Tinnitus, Vertigo, Sinus pain, snoring.  Cardio: Denies chest pain, palpitations, irregular heartbeat, syncope, dyspnea, diaphoresis, orthopnea, PND, claudication, edema Respiratory: denies cough, dyspnea, DOE, pleurisy, hoarseness, laryngitis, wheezing.  Gastrointestinal: Denies dysphagia, heartburn, reflux, water brash, pain, cramps, nausea, vomiting, bloating, diarrhea, constipation, hematemesis, melena, hematochezia, jaundice, hemorrhoids Genitourinary: Denies dysuria, frequency, urgency, nocturia, hesitancy, discharge, hematuria, flank pain Breast: Breast lumps, nipple discharge, bleeding.  Musculoskeletal: Denies arthralgia, myalgia, stiffness, Jt. Swelling, pain, limp, and  strain/sprain. Denies falls. Skin: Denies puritis, rash, hives, warts, acne, eczema, changing in skin lesion Neuro: No weakness, tremor, incoordination, spasms, paresthesia, pain Psychiatric: Denies confusion, memory loss, sensory loss. Denies Depression. Endocrine: Denies change in weight, skin, hair change, nocturia, and paresthesia, diabetic polys, visual blurring, hyper / hypo glycemic episodes.  Heme/Lymph: No excessive bleeding, bruising, enlarged lymph nodes.  Physical Exam  BP 136/78   Pulse 69   Temp 97.9 F (36.6 C)   Resp 16   Ht 5' 3.5" (1.613 m)   Wt 169  lb 12.8 oz (77 kg)   LMP 03/02/2013   SpO2 96%   BMI 29.61 kg/m   General Appearance: Well nourished, well groomed and in no apparent distress.  Eyes: PERRLA, EOMs, conjunctiva no swelling or erythema, normal fundi and vessels. Sinuses: No frontal/maxillary tenderness ENT/Mouth: EACs patent / TMs  nl. Nares clear without erythema, swelling, mucoid exudates. Oral hygiene is good. No erythema, swelling, or exudate. Tongue normal, non-obstructing. Tonsils not swollen or erythematous. Hearing normal.  Neck: Supple, thyroid not palpable. No bruits, nodes or JVD. Respiratory: Respiratory effort normal.  BS equal and clear bilateral without rales, rhonci, wheezing or stridor. Cardio: Heart sounds are normal with regular rate and rhythm and no murmurs, rubs or gallops. Peripheral pulses are normal and equal bilaterally without edema. No aortic or femoral bruits. Chest: symmetric with normal excursions and percussion. Breasts: Symmetric, without lumps, nipple discharge, retractions, or fibrocystic changes.  Abdomen: Flat, soft with bowel sounds active. Nontender, no guarding, rebound, hernias, masses, or organomegaly.  Lymphatics: Non tender without lymphadenopathy.  Genitourinary:  Musculoskeletal: Full ROM all peripheral extremities, joint stability, 5/5 strength, and normal gait. Skin: Warm and dry without rashes, lesions,  cyanosis, clubbing or  ecchymosis.  Neuro: Cranial nerves intact, reflexes equal bilaterally. Normal muscle tone, no cerebellar symptoms. Sensation intact.  Pysch: Alert and oriented X 3, normal affect, Insight and Judgment appropriate.    Assessment and Plan  1. Annual Preventative Screening Examination   2. Labile hypertension  - EKG 12-Lead - [place], RETROPERITNL ABD,  LTD - Urinalysis, Routine w reflex microscopic - Microalbumin / creatinine urine ratio  3. Hyperlipidemia, mixed  - EKG 12-Lead - [place], RETROPERITNL ABD,  LTD  4. Abnormal glucose  - EKG 12-Lead - [place], RETROPERITNL ABD,  LTD - Hemoglobin A1c - Insulin, random  5. Vitamin D deficiency  - VITAMIN D 25 Hydroxy (Vit-D Deficiency, Fractures)  6. Hypothyroidism, unspecified type  - TSH  7. Screening for colorectal cancer  - POC Hemoccult Bld/Stl (3-Cd Home Screen); Future  8. Screening examination for pulmonary tuberculosis  - TB Skin Test  9. Screening for ischemic heart disease  - EKG 12-Lead  10. FH: hypertension  - EKG 12-Lead - [place], RETROPERITNL ABD,  LTD  11. Screening for AAA (aortic abdominal aneurysm)   12. Fatigue, unspecified type  - Iron, Total/Total Iron Binding Cap - Vitamin B12 - CBC with Differential/Platelet - TSH  13. Medication management  - Urinalysis, Routine w reflex microscopic - Microalbumin / creatinine urine ratio - CBC with Differential/Platelet - COMPLETE METABOLIC PANEL WITH GFR - Magnesium - Lipid panel - TSH - Hemoglobin A1c - Insulin, random - VITAMIN D 25 Hydroxy   14. Visit for TB skin test   15. Gastroesophageal reflux disease  - pantoprazole  40 MG tablet; Take  1 tablet  2 x /day  Disp: 180 tablet; Refill: 3             Patient was counseled in prudent diet to achieve/maintain BMI less than 25 for weight control, BP monitoring, regular exercise and medications. Discussed med's effects and SE's. Screening labs and tests as requested with  regular follow-up as recommended. Over 40 minutes of exam, counseling, chart review and high complex critical decision making was performed.   Kirtland Bouchard, MD

## 2021-09-18 ENCOUNTER — Encounter: Payer: Self-pay | Admitting: Internal Medicine

## 2021-09-18 ENCOUNTER — Other Ambulatory Visit: Payer: Self-pay

## 2021-09-18 ENCOUNTER — Ambulatory Visit (INDEPENDENT_AMBULATORY_CARE_PROVIDER_SITE_OTHER): Payer: 59 | Admitting: Internal Medicine

## 2021-09-18 VITALS — BP 136/78 | HR 69 | Temp 97.9°F | Resp 16 | Ht 63.5 in | Wt 169.8 lb

## 2021-09-18 DIAGNOSIS — Z111 Encounter for screening for respiratory tuberculosis: Secondary | ICD-10-CM | POA: Diagnosis not present

## 2021-09-18 DIAGNOSIS — Z0001 Encounter for general adult medical examination with abnormal findings: Secondary | ICD-10-CM

## 2021-09-18 DIAGNOSIS — Z0000 Encounter for general adult medical examination without abnormal findings: Secondary | ICD-10-CM

## 2021-09-18 DIAGNOSIS — R7309 Other abnormal glucose: Secondary | ICD-10-CM

## 2021-09-18 DIAGNOSIS — K219 Gastro-esophageal reflux disease without esophagitis: Secondary | ICD-10-CM

## 2021-09-18 DIAGNOSIS — E039 Hypothyroidism, unspecified: Secondary | ICD-10-CM

## 2021-09-18 DIAGNOSIS — Z1211 Encounter for screening for malignant neoplasm of colon: Secondary | ICD-10-CM

## 2021-09-18 DIAGNOSIS — Z79899 Other long term (current) drug therapy: Secondary | ICD-10-CM

## 2021-09-18 DIAGNOSIS — R0989 Other specified symptoms and signs involving the circulatory and respiratory systems: Secondary | ICD-10-CM | POA: Diagnosis not present

## 2021-09-18 DIAGNOSIS — Z136 Encounter for screening for cardiovascular disorders: Secondary | ICD-10-CM | POA: Diagnosis not present

## 2021-09-18 DIAGNOSIS — E782 Mixed hyperlipidemia: Secondary | ICD-10-CM

## 2021-09-18 DIAGNOSIS — Z8249 Family history of ischemic heart disease and other diseases of the circulatory system: Secondary | ICD-10-CM

## 2021-09-18 DIAGNOSIS — R5383 Other fatigue: Secondary | ICD-10-CM

## 2021-09-18 DIAGNOSIS — E559 Vitamin D deficiency, unspecified: Secondary | ICD-10-CM

## 2021-09-18 MED ORDER — PANTOPRAZOLE SODIUM 40 MG PO TBEC: DELAYED_RELEASE_TABLET | ORAL | 3 refills | Status: DC

## 2021-09-18 NOTE — Patient Instructions (Signed)
Due to recent changes in healthcare laws, you may see the results of your imaging and laboratory studies on MyChart before your provider has had a chance to review them.  We understand that in some cases there may be results that are confusing or concerning to you. Not all laboratory results come back in the same time frame and the provider may be waiting for multiple results in order to interpret others.  Please give [place] 48 hours in order for your provider to thoroughly review all the results before contacting the office for clarification of your results.   ++++++++++++++++++++++++++++++  Vit D  & Vit C 1,000 mg   are recommended to help protect  against the Covid-19 and other Corona viruses.    Also it's recommended  to take  Zinc 50 mg  to help  protect against the Covid-19   and best place to get  is also on [email or web address]  and don't pay more than 6-8 cents /pill !  ================================ Coronavirus (COVID-19) Are you at risk?  Are you at risk for the Coronavirus (COVID-19)?  To be considered HIGH RISK for Coronavirus (COVID-19), you have to meet the following criteria:  Traveled to [place], [place], [place], [place] or [place]; or in the [place] to [place], [place], [place]  or [place]; and have fever, cough, and shortness of breath within the last 2 weeks of travel OR Been in close contact with a person diagnosed with COVID-19 within the last 2 weeks and have  fever, cough,and shortness of breath  IF YOU DO NOT MEET THESE CRITERIA, YOU ARE CONSIDERED LOW RISK FOR COVID-19.  What to do if you are HIGH RISK for COVID-19?  If you are having a medical emergency, call 911. Seek medical care right away. Before you go to a doctor's office, urgent care or emergency department,  call ahead and tell them about your recent travel, contact with someone diagnosed with COVID-19   and your symptoms.  You should receive instructions from your physician's office regarding  next steps of care.  When you arrive at healthcare provider, tell the healthcare staff immediately you have returned from  visiting [place], [place], [place], [place] or [place]; or traveled in the [place] to [place], [place],  [place] or [place] in the last two weeks or you have been in close contact with a person diagnosed with  COVID-19 in the last 2 weeks.   Tell the health care staff about your symptoms: fever, cough and shortness of breath. After you have been seen by a medical provider, you will be either: Tested for (COVID-19) and discharged home on quarantine except to seek medical care if  symptoms worsen, and asked to  Stay home and avoid contact with others until you get your results (4-5 days)  Avoid travel on public transportation if possible (such as bus, train, or airplane) or Sent to the Emergency Department by EMS for evaluation, COVID-19 testing  and  possible admission depending on your condition and test results.  What to do if you are LOW RISK for COVID-19?  Reduce your risk of any infection by using the same precautions used for avoiding the common cold or flu:  Wash your hands often with soap and warm water for at least 20 seconds.  If soap and water are not readily available,  use an alcohol-based hand sanitizer with at least 60% alcohol.  If coughing or sneezing, cover your mouth and nose by coughing  or sneezing into the elbow areas of your shirt or coat,  into a tissue or into your sleeve (not your hands). Avoid shaking hands with others and consider head nods or verbal greetings only. Avoid touching your eyes, nose, or mouth with unwashed hands.  Avoid close contact with people who are sick. Avoid places or events with large numbers of people in one location, like concerts or sporting events. Carefully consider travel plans you have or are making. If you are planning any travel outside or inside the [place], visit the CDC's Travelers' Health webpage for  the latest health notices. If you have some symptoms but not all symptoms, continue to monitor at home and seek medical attention  if your symptoms worsen. If you are having a medical emergency, call 911. >>>>>>>>>>>>>>>>>>>>>>> Preventive Care for Adults  A healthy lifestyle and preventive care can promote health and wellness. Preventive health guidelines for women include the following key practices. A routine yearly physical is a good way to check with your health care provider about your health and preventive screening. It is a chance to share any concerns and updates on your health and to receive a thorough exam. Visit your dentist for a routine exam and preventive care every 6 months. Brush your teeth twice a day and floss once a day. Good oral hygiene prevents tooth decay and gum disease. The frequency of eye exams is based on your age, health, family medical history, use of contact lenses, and other factors. Follow your health care provider's recommendations for frequency of eye exams. Eat a healthy diet. Foods like vegetables, fruits, whole grains, low-fat dairy products, and lean protein foods contain the nutrients you need without too many calories. Decrease your intake of foods high in solid fats, added sugars, and salt. Eat the right amount of calories for you. Get information about a proper diet from your health care provider, if necessary. Regular physical exercise is one of the most important things you can do for your health. Most adults should get at least 150 minutes of moderate-intensity exercise (any activity that increases your heart rate and causes you to sweat) each week. In addition, most adults need muscle-strengthening exercises on 2 or more days a week. Maintain a healthy weight. The body mass index (BMI) is a screening tool to identify possible weight problems. It provides an estimate of body fat based on height and weight. Your health care provider can find your BMI and can  help you achieve or maintain a healthy weight. For adults 20 years and older: A BMI below 18.5 is considered underweight. A BMI of 18.5 to 24.9 is normal. A BMI of 25 to 29.9 is considered overweight. A BMI of 30 and above is considered obese. Maintain normal blood lipids and cholesterol levels by exercising and minimizing your intake of saturated fat. Eat a balanced diet with plenty of fruit and vegetables. Blood tests for lipids and cholesterol should begin at age 43 and be repeated every 5 years. If your lipid or cholesterol levels are high, you are over 50, or you are at high risk for heart disease, you may need your cholesterol levels checked more frequently. Ongoing high lipid and cholesterol levels should be treated with medicines if diet and exercise are not working. If you smoke, find out from your health care provider how to quit. If you do not use tobacco, do not start. Lung cancer screening is recommended for adults aged 11-80 years who are at high risk for developing  lung cancer because of a history of smoking. A yearly low-dose CT scan of the lungs is recommended for people who have at least a 30-pack-year history of smoking and are a current smoker or have quit within the past 15 years. A pack year of smoking is smoking an average of 1 pack of cigarettes a day for 1 year (for example: 1 pack a day for 30 years or 2 packs a day for 15 years). Yearly screening should continue until the smoker has stopped smoking for at least 15 years. Yearly screening should be stopped for people who develop a health problem that would prevent them from having lung cancer treatment. High blood pressure causes heart disease and increases the risk of stroke. Your blood pressure should be checked at least every 1 to 2 years. Ongoing high blood pressure should be treated with medicines if weight loss and exercise do not work. If you are 32-3 years old, ask your health care provider if you should take aspirin to  prevent strokes. Diabetes screening involves taking a blood sample to check your fasting blood sugar level. This should be done once every 3 years, after age 32, if you are within normal weight and without risk factors for diabetes. Testing should be considered at a younger age or be carried out more frequently if you are overweight and have at least 1 risk factor for diabetes. Breast cancer screening is essential preventive care for women. You should practice "breast self-awareness." This means understanding the normal appearance and feel of your breasts and may include breast self-examination. Any changes detected, no matter how small, should be reported to a health care provider. Women in their 45s and 30s should have a clinical breast exam (CBE) by a health care provider as part of a regular health exam every 1 to 3 years. After age 83, women should have a CBE every year. Starting at age 92, women should consider having a mammogram (breast X-ray test) every year. Women who have a family history of breast cancer should talk to their health care provider about genetic screening. Women at a high risk of breast cancer should talk to their health care providers about having an MRI and a mammogram every year. Breast cancer gene (BRCA)-related cancer risk assessment is recommended for women who have family members with BRCA-related cancers. BRCA-related cancers include breast, ovarian, tubal, and peritoneal cancers. Having family members with these cancers may be associated with an increased risk for harmful changes (mutations) in the breast cancer genes BRCA1 and BRCA2. Results of the assessment will determine the need for genetic counseling and BRCA1 and BRCA2 testing. Routine pelvic exams to screen for cancer are no longer recommended for nonpregnant women who are considered low risk for cancer of the pelvic organs (ovaries, uterus, and vagina) and who do not have symptoms. Ask your health care provider if a  screening pelvic exam is right for you. If you have had past treatment for cervical cancer or a condition that could lead to cancer, you need Pap tests and screening for cancer for at least 20 years after your treatment. If Pap tests have been discontinued, your risk factors (such as having a new sexual partner) need to be reassessed to determine if screening should be resumed. Some women have medical problems that increase the chance of getting cervical cancer. In these cases, your health care provider may recommend more frequent screening and Pap tests. Colorectal cancer can be detected and often prevented. Most routine colorectal  cancer screening begins at the age of 10 years and continues through age 19 years. However, your health care provider may recommend screening at an earlier age if you have risk factors for colon cancer. On a yearly basis, your health care provider may provide home test kits to check for hidden blood in the stool. Use of a small camera at the end of a tube, to directly examine the colon (sigmoidoscopy or colonoscopy), can detect the earliest forms of colorectal cancer. Talk to your health care provider about this at age 28, when routine screening begins.  Direct exam of the colon should be repeated every 5-10 years through age 71 years, unless early forms of pre-cancerous polyps or small growths are found. Hepatitis C blood testing is recommended for all people born from [birth year] through 1965 and any individual with known risks for hepatitis C.  Osteoporosis is a disease in which the bones lose minerals and strength with aging. This can result in serious bone fractures or breaks. The risk of osteoporosis can be identified using a bone density scan. Women ages 21 years and over and women at risk for fractures or osteoporosis should discuss screening with their health care providers. Ask your health care provider whether you should take a calcium supplement or vitamin D to reduce the rate  of osteoporosis. Menopause can be associated with physical symptoms and risks. Hormone replacement therapy is available to decrease symptoms and risks. You should talk to your health care provider about whether hormone replacement therapy is right for you. Use sunscreen. Apply sunscreen liberally and repeatedly throughout the day. You should seek shade when your shadow is shorter than you. Protect yourself by wearing long sleeves, pants, a wide-brimmed hat, and sunglasses year round, whenever you are outdoors. Once a month, do a whole body skin exam, using a mirror to look at the skin on your back. Tell your health care provider of new moles, moles that have irregular borders, moles that are larger than a pencil eraser, or moles that have changed in shape or color. Stay current with required vaccines (immunizations). Influenza vaccine. All adults should be immunized every year. Tetanus, diphtheria, and acellular pertussis (Td, Tdap) vaccine. Pregnant women should receive 1 dose of Tdap vaccine during each pregnancy. The dose should be obtained regardless of the length of time since the last dose. Immunization is preferred during the 27th-36th week of gestation. An adult who has not previously received Tdap or who does not know her vaccine status should receive 1 dose of Tdap. This initial dose should be followed by tetanus and diphtheria toxoids (Td) booster doses every 10 years. Adults with an unknown or incomplete history of completing a 3-dose immunization series with Td-containing vaccines should begin or complete a primary immunization series including a Tdap dose. Adults should receive a Td booster every 10 years. Varicella vaccine. An adult without evidence of immunity to varicella should receive 2 doses or a second dose if she has previously received 1 dose. Pregnant females who do not have evidence of immunity should receive the first dose after pregnancy. This first dose should be obtained before  leaving the health care facility. The second dose should be obtained 4-8 weeks after the first dose. Human papillomavirus (HPV) vaccine. Females aged 13-26 years who have not received the vaccine previously should obtain the 3-dose series. The vaccine is not recommended for use in pregnant females. However, pregnancy testing is not needed before receiving a dose. If a female is found to  be pregnant after receiving a dose, no treatment is needed. In that case, the remaining doses should be delayed until after the pregnancy. Immunization is recommended for any person with an immunocompromised condition through the age of 55 years if she did not get any or all doses earlier. During the 3-dose series, the second dose should be obtained 4-8 weeks after the first dose. The third dose should be obtained 24 weeks after the first dose and 16 weeks after the second dose. Zoster vaccine. One dose is recommended for adults aged 15 years or older unless certain conditions are present. Measles, mumps, and rubella (MMR) vaccine. Adults born before 20 generally are considered immune to measles and mumps. Adults born in [birth year] or later should have 1 or more doses of MMR vaccine unless there is a contraindication to the vaccine or there is laboratory evidence of immunity to each of the three diseases. A routine second dose of MMR vaccine should be obtained at least 28 days after the first dose for students attending postsecondary schools, health care workers, or international travelers. People who received inactivated measles vaccine or an unknown type of measles vaccine during 1963-1967 should receive 2 doses of MMR vaccine. People who received inactivated mumps vaccine or an unknown type of mumps vaccine before 1979 and are at high risk for mumps infection should consider immunization with 2 doses of MMR vaccine. For females of childbearing age, rubella immunity should be determined. If there is no evidence of immunity, females  who are not pregnant should be vaccinated. If there is no evidence of immunity, females who are pregnant should delay immunization until after pregnancy. Unvaccinated health care workers born before [birth year] who lack laboratory evidence of measles, mumps, or rubella immunity or laboratory confirmation of disease should consider measles and mumps immunization with 2 doses of MMR vaccine or rubella immunization with 1 dose of MMR vaccine. Pneumococcal 13-valent conjugate (PCV13) vaccine. When indicated, a person who is uncertain of her immunization history and has no record of immunization should receive the PCV13 vaccine. An adult aged 4 years or older who has certain medical conditions and has not been previously immunized should receive 1 dose of PCV13 vaccine. This PCV13 should be followed with a dose of pneumococcal polysaccharide (PPSV23) vaccine. The PPSV23 vaccine dose should be obtained at least 1 or more year(s) after the dose of PCV13 vaccine. An adult aged 15 years or older who has certain medical conditions and previously received 1 or more doses of PPSV23 vaccine should receive 1 dose of PCV13. The PCV13 vaccine dose should be obtained 1 or more years after the last PPSV23 vaccine dose.  Pneumococcal polysaccharide (PPSV23) vaccine. When PCV13 is also indicated, PCV13 should be obtained first. All adults aged 9 years and older should be immunized. An adult younger than age 14 years who has certain medical conditions should be immunized. Any person who resides in a nursing home or long-term care facility should be immunized. An adult smoker should be immunized. People with an immunocompromised condition and certain other conditions should receive both PCV13 and PPSV23 vaccines. People with human immunodeficiency virus (HIV) infection should be immunized as soon as possible after diagnosis. Immunization during chemotherapy or radiation therapy should be avoided. Routine use of PPSV23 vaccine is not  recommended for American Indians, [place] Natives, or people younger than 65 years unless there are medical conditions that require PPSV23 vaccine. When indicated, people who have unknown immunization and have no record of immunization should receive  PPSV23 vaccine. One-time revaccination 5 years after the first dose of PPSV23 is recommended for people aged 19-64 years who have chronic kidney failure, nephrotic syndrome, asplenia, or immunocompromised conditions. People who received 1-2 doses of PPSV23 before age 36 years should receive another dose of PPSV23 vaccine at age 52 years or later if at least 5 years have passed since the previous dose. Doses of PPSV23 are not needed for people immunized with PPSV23 at or after age 34 years.  Preventive Services / Frequency  Ages 74 to 1 years Blood pressure check. Lipid and cholesterol check. Lung cancer screening. / Every year if you are aged 53-80 years and have a 30-pack-year history of smoking and currently smoke or have quit within the past 15 years. Yearly screening is stopped once you have quit smoking for at least 15 years or develop a health problem that would prevent you from having lung cancer treatment. Clinical breast exam.** / Every year after age 16 years.  BRCA-related cancer risk assessment.** / For women who have family members with a BRCA-related cancer (breast, ovarian, tubal, or peritoneal cancers). Mammogram.** / Every year beginning at age 26 years and continuing for as long as you are in good health. Consult with your health care provider. Pap test.** / Every 3 years starting at age 19 years through age 52 or 29 years with a history of 3 consecutive normal Pap tests. HPV screening.** / Every 3 years from ages 18 years through ages 4 to 110 years with a history of 3 consecutive normal Pap tests. Fecal occult blood test (FOBT) of stool. / Every year beginning at age 70 years and continuing until age 53 years. You may not need to do this  test if you get a colonoscopy every 10 years. Flexible sigmoidoscopy or colonoscopy.** / Every 5 years for a flexible sigmoidoscopy or every 10 years for a colonoscopy beginning at age 66 years and continuing until age 44 years. Hepatitis C blood test.** / For all people born from [birth year] through 1964 and any individual with known risks for hepatitis C. Skin self-exam. / Monthly. Influenza vaccine. / Every year. Tetanus, diphtheria, and acellular pertussis (Tdap/Td) vaccine.** / Consult your health care provider. Pregnant women should receive 1 dose of Tdap vaccine during each pregnancy. 1 dose of Td every 10 years. Varicella vaccine.** / Consult your health care provider. Pregnant females who do not have evidence of immunity should receive the first dose after pregnancy. Zoster vaccine.** / 1 dose for adults aged 90 years or older. Pneumococcal 13-valent conjugate (PCV13) vaccine.** / Consult your health care provider. Pneumococcal polysaccharide (PPSV23) vaccine.** / 1 to 2 doses if you smoke cigarettes or if you have certain conditions. Meningococcal vaccine.** / Consult your health care provider. Hepatitis A vaccine.** / Consult your health care provider. Hepatitis B vaccine.** / Consult your health care provider. Screening for abdominal aortic aneurysm (AAA)  by ultrasound is recommended for people over 50 who have history of high blood pressure or who are current or former smokers. ++++++++++++++++++ Recommend Adult Low Dose Aspirin or  coated  Aspirin 81 mg daily  To reduce risk of Colon Cancer 40 %,  Skin Cancer 26 % ,  Melanoma 46%  and  Pancreatic cancer 60% +++++++++++++++++++ Vitamin D goal  is between 70-100.  Please make sure that you are taking your Vitamin D as directed.  It is very important as a natural anti-inflammatory  helping hair, skin, and nails, as well as reducing stroke and heart  attack risk.  It helps your bones and helps with mood. It also decreases numerous  cancer risks so please take it as directed.  Low Vit D is associated with a 200-300% higher risk for CANCER  and 200-300% higher risk for HEART   ATTACK  &  STROKE.   .....................................Marland Kitchen It is also associated with higher death rate at younger ages,  autoimmune diseases like Rheumatoid arthritis, Lupus, Multiple Sclerosis.    Also many other serious conditions, like depression, Alzheimer's Dementia, infertility, muscle aches, fatigue, fibromyalgia - just to name a few. ++++++++++++++++++ Recommend the book "The END of DIETING" by Dr Excell Seltzer  & the book "The END of DIABETES " by Dr Excell Seltzer At [email or web address] - get book & Audio CD's    Being diabetic has a  300% increased risk for heart attack, stroke, cancer, and alzheimer- type vascular dementia. It is very important that you work harder with diet by avoiding all foods that are white. Avoid white rice (brown & wild rice is OK), white potatoes (sweetpotatoes in moderation is OK), White bread or wheat bread or anything made out of white flour like bagels, donuts, rolls, buns, biscuits, cakes, pastries, cookies, pizza crust, and pasta (made from white flour & egg whites) - vegetarian pasta or spinach or wheat pasta is OK. Multigrain breads like Arnold's or [employer], or multigrain sandwich thins or flatbreads.  Diet, exercise and weight loss can reverse and cure diabetes in the early stages.  Diet, exercise and weight loss is very important in the control and prevention of complications of diabetes which affects every system in your body, ie. Brain - dementia/stroke, eyes - glaucoma/blindness, heart - heart attack/heart failure, kidneys - dialysis, stomach - gastric paralysis, intestines - malabsorption, nerves - severe painful neuritis, circulation - gangrene & loss of a leg(s), and finally cancer and Alzheimers.    I recommend avoid fried & greasy foods,  sweets/candy, white rice (brown or wild rice or Quinoa is OK), white  potatoes (sweet potatoes are OK) - anything made from white flour - bagels, doughnuts, rolls, buns, biscuits,white and wheat breads, pizza crust and traditional pasta made of white flour & egg white(vegetarian pasta or spinach or wheat pasta is OK).  Multi-grain bread is OK - like multi-grain flat bread or sandwich thins. Avoid alcohol in excess. Exercise is also important.    Eat all the vegetables you want - avoid meat, especially red meat and dairy - especially cheese.  Cheese is the most concentrated form of trans-fats which is the worst thing to clog up our arteries. Veggie cheese is OK which can be found in the fresh produce section at [employer] or [employer] or [employer]  ++++++++++++++++++++++ DASH Eating Plan  DASH stands for "Dietary Approaches to Stop Hypertension."   The DASH eating plan is a healthy eating plan that has been shown to reduce high blood pressure (hypertension). Additional health benefits may include reducing the risk of type 2 diabetes mellitus, heart disease, and stroke. The DASH eating plan may also help with weight loss. WHAT DO I NEED TO KNOW ABOUT THE DASH EATING PLAN? For the DASH eating plan, you will follow these general guidelines: Choose foods with a percent daily value for sodium of less than 5% (as listed on the food label). Use salt-free seasonings or herbs instead of table salt or sea salt. Check with your health care provider or pharmacist before using salt substitutes. Eat lower-sodium products, often labeled as "lower sodium" or "no  salt added." Eat fresh foods. Eat more vegetables, fruits, and low-fat dairy products. Choose whole grains. Look for the word "whole" as the first word in the ingredient list. Choose fish  Limit sweets, desserts, sugars, and sugary drinks. Choose heart-healthy fats. Eat veggie cheese  Eat more home-cooked food and less restaurant, buffet, and fast food. Limit fried foods. Cook foods using methods other than  frying. Limit canned vegetables. If you do use them, rinse them well to decrease the sodium. When eating at a restaurant, ask that your food be prepared with less salt, or no salt if possible.                      WHAT FOODS CAN I EAT? Read Dr Fara Olden Fuhrman's books on The End of Dieting & The End of Diabetes  Grains Whole grain or whole wheat bread. Brown rice. Whole grain or whole wheat pasta. Quinoa, bulgur, and whole grain cereals. Low-sodium cereals. Corn or whole wheat flour tortillas. Whole grain cornbread. Whole grain crackers. Low-sodium crackers.  Vegetables Fresh or frozen vegetables (raw, steamed, roasted, or grilled). Low-sodium or reduced-sodium tomato and vegetable juices. Low-sodium or reduced-sodium tomato sauce and paste. Low-sodium or reduced-sodium canned vegetables.   Fruits All fresh, canned (in natural juice), or frozen fruits.  Protein Products  All fish and seafood.  Dried beans, peas, or lentils. Unsalted nuts and seeds. Unsalted canned beans.  Dairy Low-fat dairy products, such as skim or 1% milk, 2% or reduced-fat cheeses, low-fat ricotta or cottage cheese, or plain low-fat yogurt. Low-sodium or reduced-sodium cheeses.  Fats and Oils Tub margarines without trans fats. Light or reduced-fat mayonnaise and salad dressings (reduced sodium). Avocado. Safflower, olive, or canola oils. Natural peanut or almond butter.  Other Unsalted popcorn and pretzels. The items listed above may not be a complete list of recommended foods or beverages. Contact your dietitian for more options.  ++++++++++++++++++  WHAT FOODS ARE NOT RECOMMENDED? Grains/ White flour or wheat flour White bread. White pasta. White rice. Refined cornbread. Bagels and croissants. Crackers that contain trans fat.  Vegetables  Creamed or fried vegetables. Vegetables in a . Regular canned vegetables. Regular canned tomato sauce and paste. Regular tomato and vegetable juices.  Fruits Dried fruits.  Canned fruit in light or heavy syrup. Fruit juice.  Meat and Other Protein Products Meat in general - RED meat & White meat.  Fatty cuts of meat. Ribs, chicken wings, all processed meats as bacon, sausage, bologna, salami, fatback, hot dogs, bratwurst and packaged luncheon meats.  Dairy Whole or 2% milk, cream, half-and-half, and cream cheese. Whole-fat or sweetened yogurt. Full-fat cheeses or blue cheese. Non-dairy creamers and whipped toppings. Processed cheese, cheese spreads, or cheese curds.  Condiments Onion and garlic salt, seasoned salt, table salt, and sea salt. Canned and packaged gravies. Worcestershire sauce. Tartar sauce. Barbecue sauce. Teriyaki sauce. Soy sauce, including reduced sodium. Steak sauce. Fish sauce. Oyster sauce. Cocktail sauce. Horseradish. Ketchup and mustard. Meat flavorings and tenderizers. Bouillon cubes. Hot sauce. Tabasco sauce. Marinades. Taco seasonings. Relishes.  Fats and Oils Butter, stick margarine, lard, shortening and bacon fat. Coconut, palm kernel, or palm oils. Regular salad dressings.  Pickles and olives. Salted popcorn and pretzels.  The items listed above may not be a complete list of foods and beverages to avoid.

## 2021-09-19 LAB — IRON, TOTAL/TOTAL IRON BINDING CAP
%SAT: 27 % (calc) (ref 16–45)
Iron: 84 ug/dL (ref 45–160)
TIBC: 313 mcg/dL (calc) (ref 250–450)

## 2021-09-19 LAB — URINALYSIS, ROUTINE W REFLEX MICROSCOPIC
Bilirubin Urine: NEGATIVE
Glucose, UA: NEGATIVE
Hgb urine dipstick: NEGATIVE
Ketones, ur: NEGATIVE
Leukocytes,Ua: NEGATIVE
Nitrite: NEGATIVE
Protein, ur: NEGATIVE
Specific Gravity, Urine: 1.006 (ref 1.001–1.035)
pH: 7 (ref 5.0–8.0)

## 2021-09-19 LAB — COMPLETE METABOLIC PANEL WITH GFR
AG Ratio: 1.5 (calc) (ref 1.0–2.5)
ALT: 15 U/L (ref 6–29)
AST: 20 U/L (ref 10–35)
Albumin: 4.4 g/dL (ref 3.6–5.1)
Alkaline phosphatase (APISO): 49 U/L (ref 37–153)
BUN/Creatinine Ratio: 18 (calc) (ref 6–22)
BUN: 19 mg/dL (ref 7–25)
CO2: 31 mmol/L (ref 20–32)
Calcium: 9.9 mg/dL (ref 8.6–10.4)
Chloride: 105 mmol/L (ref 98–110)
Creat: 1.06 mg/dL — ABNORMAL HIGH (ref 0.50–1.03)
Globulin: 2.9 g/dL (calc) (ref 1.9–3.7)
Glucose, Bld: 87 mg/dL (ref 65–99)
Potassium: 5 mmol/L (ref 3.5–5.3)
Sodium: 143 mmol/L (ref 135–146)
Total Bilirubin: 0.4 mg/dL (ref 0.2–1.2)
Total Protein: 7.3 g/dL (ref 6.1–8.1)
eGFR: 61 mL/min/{1.73_m2} (ref >=60)

## 2021-09-19 LAB — MICROALBUMIN / CREATININE URINE RATIO
Creatinine, Urine: 22 mg/dL (ref 20–275)
Microalb, Ur: <0.2 mg/dL

## 2021-09-19 LAB — HEMOGLOBIN A1C
Hgb A1c MFr Bld: 5.6 % of total Hgb (ref <5.7)
Mean Plasma Glucose: 114 mg/dL
eAG (mmol/L): 6.3 mmol/L

## 2021-09-19 LAB — VITAMIN D 25 HYDROXY (VIT D DEFICIENCY, FRACTURES): Vit D, 25-Hydroxy: 40 ng/mL (ref 30–100)

## 2021-09-19 LAB — LIPID PANEL
Cholesterol: 159 mg/dL (ref <200)
HDL: 77 mg/dL (ref >=50)
LDL Cholesterol (Calc): 68 mg/dL (calc)
Non-HDL Cholesterol (Calc): 82 mg/dL (calc) (ref <130)
Total CHOL/HDL Ratio: 2.1 (calc) (ref <5.0)
Triglycerides: 68 mg/dL (ref <150)

## 2021-09-19 LAB — CBC WITH DIFFERENTIAL/PLATELET
Absolute Monocytes: 528 cells/uL (ref 200–950)
Basophils Absolute: 30 cells/uL (ref 0–200)
Basophils Relative: 0.5 %
Eosinophils Absolute: 258 cells/uL (ref 15–500)
Eosinophils Relative: 4.3 %
HCT: 42.5 % (ref 35.0–45.0)
Hemoglobin: 13.8 g/dL (ref 11.7–15.5)
Lymphs Abs: 1356 cells/uL (ref 850–3900)
MCH: 29.3 pg (ref 27.0–33.0)
MCHC: 32.5 g/dL (ref 32.0–36.0)
MCV: 90.2 fL (ref 80.0–100.0)
MPV: 10.1 fL (ref 7.5–12.5)
Monocytes Relative: 8.8 %
Neutro Abs: 3828 cells/uL (ref 1500–7800)
Neutrophils Relative %: 63.8 %
Platelets: 206 10*3/uL (ref 140–400)
RBC: 4.71 10*6/uL (ref 3.80–5.10)
RDW: 12.4 % (ref 11.0–15.0)
Total Lymphocyte: 22.6 %
WBC: 6 10*3/uL (ref 3.8–10.8)

## 2021-09-19 LAB — MAGNESIUM: Magnesium: 2.3 mg/dL (ref 1.5–2.5)

## 2021-09-19 LAB — INSULIN, RANDOM: Insulin: 6.4 u[IU]/mL

## 2021-09-19 LAB — VITAMIN B12: Vitamin B-12: 545 pg/mL (ref 200–1100)

## 2021-09-19 LAB — TSH: TSH: 2.35 mIU/L (ref 0.40–4.50)

## 2021-09-19 NOTE — Progress Notes (Signed)
============================================================ -   Test results slightly outside the reference range are not unusual. If there is anything important, I will review this with you,  otherwise it is considered normal test values.  If you have further questions,  please do not hesitate to contact me at the office or via My Chart.  ============================================================ ============================================================  -  Iron level back in Normal range - Great !  -  Vitamin B12   level - Normal  ============================================================ ============================================================  -  Lids much better   - Total chol down from 205  to 159   &                                                   LDL down from 110 to 68 - Both  Excellent   - Very low risk for Heart Attack  / Stroke ============================================================ ============================================================  -   A1c - Normal - No Diabetes  - Great ! ============================================================ ============================================================  -  Vitamin D = 40 - Still way too Low !   - Vitamin D goal is between 70-100.   - Please INCREASE  your Vitamin D up to 6,000 units / Day   - It is very important as a natural anti-inflammatory and helping the  immune system protect against viral infections, like the Covid-19    helping hair, skin, and nails, as well as reducing stroke and  heart attack risk.   - It helps your bones and helps with mood.  - It also decreases numerous cancer risks so please  take it as directed.   - Low Vit D is associated with a 200-300% higher risk for  CANCER   and 200-300% higher risk for HEART   ATTACK  &  STROKE.    - It is also associated with higher death rate at younger ages,   autoimmune diseases like Rheumatoid arthritis, Lupus,  Multiple  Sclerosis.     - Also many other serious conditions, like depression, Alzheimer's  Dementia, infertility, muscle aches, fatigue, fibromyalgia   - just to name a few. ============================================================ ============================================================  -  All Else - CBC - Kidneys -  U/A - Electrolytes - Liver - Magnesium & Thyroid    - all  Normal / OK ============================================================ ============================================================

## 2021-09-25 ENCOUNTER — Telehealth: Payer: Self-pay

## 2021-09-25 NOTE — Telephone Encounter (Signed)
Patient has weaned down to take 1/2 estrogen tablet a week and is preparing to completely stopped. She asked if she could stop progesterone once she has stopped estrogen.  Patient was advised  ok to stop progesterone when she is finished taking estrogen per Dr. Mariah Milling  office note on 05/14/21 "We will continue progesterone 100 mg daily at bedtime until stops completely with estrogen. "

## 2021-10-01 ENCOUNTER — Other Ambulatory Visit: Payer: Self-pay

## 2021-10-01 DIAGNOSIS — Z1212 Encounter for screening for malignant neoplasm of rectum: Secondary | ICD-10-CM

## 2021-10-01 DIAGNOSIS — Z1211 Encounter for screening for malignant neoplasm of colon: Secondary | ICD-10-CM

## 2021-10-01 LAB — POC HEMOCCULT BLD/STL (HOME/3-CARD/SCREEN)
Card #2 Fecal Occult Blod, POC: NEGATIVE
Card #3 Fecal Occult Blood, POC: NEGATIVE
Fecal Occult Blood, POC: NEGATIVE

## 2021-10-10 ENCOUNTER — Encounter: Payer: Self-pay | Admitting: Podiatry

## 2021-10-10 ENCOUNTER — Ambulatory Visit: Payer: 59 | Admitting: Podiatry

## 2021-10-10 ENCOUNTER — Other Ambulatory Visit: Payer: Self-pay

## 2021-10-10 DIAGNOSIS — S90852A Superficial foreign body, left foot, initial encounter: Secondary | ICD-10-CM | POA: Diagnosis not present

## 2021-10-12 NOTE — Progress Notes (Signed)
She presents today for excision of foreign body left foot.  There is a small palpable mass has been bothering her top lateral aspect of her left foot.  Is been there since surgery.  Objective: Vital signs are stable she is alert and oriented x3 pulses are palpable.  There is no erythema edema cellulitis drainage or odor no small nodule appears to be green or blue through the skin it is nonpalpable and is firm to the touch overlying the dorsal lateral aspect of the foot just above the fifth metatarsal base.  Most likely a stitch from her previous tendon repair.  Assessment painful nodule left foot.  Plan: Discussed etiology pathology conservative surgical therapies today we injected a small amount of local around the nodule.  Prepped the skin normal sterile fashion.  Made a very small linear incision with a #11 blade measuring about 2 mm in length and was able to excise a knot from a green Ethibond suture.  The area was copiously lavaged with alcohol Dermabond was placed over the wound with a dressed a compressive dressing asked her to leave this on for the next 3 to 5 days I will follow-up with me in 1 to 2 weeks.

## 2021-10-15 ENCOUNTER — Other Ambulatory Visit: Payer: Self-pay

## 2021-10-15 ENCOUNTER — Ambulatory Visit
Admission: RE | Admit: 2021-10-15 | Discharge: 2021-10-15 | Disposition: A | Discharge status: Home / Self Care | Payer: 59 | Source: Ambulatory Visit | Attending: Internal Medicine | Admitting: Internal Medicine

## 2021-10-15 DIAGNOSIS — Z1231 Encounter for screening mammogram for malignant neoplasm of breast: Secondary | ICD-10-CM

## 2021-10-24 ENCOUNTER — Ambulatory Visit: Payer: 59 | Admitting: Podiatry

## 2021-10-24 ENCOUNTER — Other Ambulatory Visit: Payer: Self-pay

## 2021-10-24 ENCOUNTER — Encounter: Payer: Self-pay | Admitting: Podiatry

## 2021-10-24 DIAGNOSIS — Z9889 Other specified postprocedural states: Secondary | ICD-10-CM

## 2021-10-24 DIAGNOSIS — S90852D Superficial foreign body, left foot, subsequent encounter: Secondary | ICD-10-CM

## 2021-10-24 NOTE — Progress Notes (Signed)
She presents today for follow-up of excision nodule foreign body of her left foot.  She states that parsed wound is fine still little tender with bruising and that all in all is doing much better.  States that she still feels some Planter fasciitis of the heel occasionally but more over she feels some tenderness in the fifth metatarsal base still.  States that it has improved but is not gone away has not healed completely yet.  Objective: Vital signs are stable alert oriented x3.  She still has tenderness on some fifth met base left.  Minimal tenderness on palpation of the heel.  She has a well-healed excision stitch abscess.  Assessment well-healing surgical foot some residual pains of fifth met.  Plan: Recommended that she stay in a straight last type shoe with a neutral heel.  She understands that and is amenable to it I will recommend that she start back walking try to exercise I will follow-up with her on an as-needed basis. 

## 2021-10-28 ENCOUNTER — Other Ambulatory Visit: Payer: Self-pay

## 2021-10-28 MED ORDER — ROSUVASTATIN CALCIUM 20 MG PO TABS: ORAL_TABLET | ORAL | 1 refills | Status: DC

## 2021-12-17 NOTE — Progress Notes (Signed)
3 MONTH FOLLOW UP  Assessment and Plan:  Hypertension At goal at this time off of medications Monitor blood pressure at home; patient to call if consistently greater than 130/80 Continue DASH diet.   Reminder to go to the ER if any CP, SOB, nausea, dizziness, severe HA, changes vision/speech, left arm numbness and tingling and jaw pain.  Cholesterol Currently with mild elevations not requiring statin treatment; on RYRS, omega 3 supplements goal maintain LDL <130 Continue low cholesterol diet and exercise.  Check lipid panel.   Abnormal glucose Recent A1Cs at goal Discussed diet/exercise, weight management  Defer A1C; check CMP  Hypothyroidism continue medications the same pending lab results reminded to take on an empty stomach 30-50mins before food.  check TSH level  Obesity Continue to recommend diet heavy in fruits and veggies and low in animal meats, cheeses, and dairy products, appropriate calorie intake Discuss exercise recommendations routinely Continue to monitor weight at each visit  Vitamin D Def Near goal at last visit; continue supplementation to maintain goal of 60-100 Defer Vit D level  GERD Well managed on current medications; didn't tolerate taper from PPI to H2i Discussed diet, avoiding triggers and other lifestyle changes  Major depression with anxiety ([place]) Start new medications as prescribed; buspar to bridge Stress management techniques discussed, increase water, good sleep hygiene discussed, increase exercise, and increase veggies.  Follow up 1 month, call the office if any new AE's from medications and we will switch them -     escitalopram (LEXAPRO) 20 MG tablet; Take 1 tablet (20 mg total) by mouth daily.  Insomnia, unspecified type Increase gabapentin to 3 tabs at bedtime Practice good sleep hygiene  Left trapezius muscle spasm Trigger point injection in 1 muscles, 4 spots Use heat and Advil as needed  Cough Continue Mucinex for  congestion Tessalon perles 100mg  every 6 hours as needed for cough  Skin lesion of scalp Had the same area frozen previously at dermatologist Make follow up appointment at dermatology for reevaluation    Further disposition pending results of labs. Discussed med's effects and SE's.   Over 30 minutes of exam, counseling, chart review, and critical decision making was performed.   Future Appointments  Date Time Provider [place]  05/15/2022  2:00 PM Princess Bruins, MD [name] None    ------------------------------------------------------------------------------------------------------------------   HPI BP 128/82    Pulse 72    Temp (!) 96.6 F (35.9 C)    Wt 177 lb 6.4 oz (80.5 kg)    LMP 03/02/2013    SpO2 99%    BMI 30.93 kg/m   61 y.o.female presents for 3 month follow up of hyperlipidemia, hypothyroidism, abnormal glucose, Vit D deficiency, GERD, abnormal glucose, depression  She had cold /cough x 3 weeks.  Still has a dry cough.  Denies fever, nausea, vomiting, diarrhea, myalgias, congestion  She fell 09/2021 , fell backwards and hit head on concrete floor.  Had MRI to rule out a bleed . She has continued pain that is described as sharp pain in the left side of neck.  She is having difficulty sleeping. This has been ongoing x several months.  Wakes up frequently throughout the night.  She takes Gabapentin and Melatonin at night so has been falling asleep ok. She tried Trazodone in the past but did not help with sleep.   Dermatologist froze spot on top of head 09/2021. She does not believe it ever resolved. The area is nontender but is itchy. It has not increased in  size.   BMI is Body mass index is 30.93 kg/m., she has been working on diet and exercise. Wt Readings from Last 3 Encounters:  12/19/21 177 lb 6.4 oz (80.5 kg)  09/18/21 169 lb 12.8 oz (77 kg)  09/04/21 171 lb (77.6 kg)   Her blood pressure has been controlled at home, today their BP is BP:  128/82 BP Readings from Last 3 Encounters:  12/19/21 128/82  09/18/21 136/78  09/04/21 122/82     She does workout. She denies chest pain, shortness of breath, dizziness.  She is not on cholesterol medication and denies myalgias. Her cholesterol is not at goal. The cholesterol last visit was:   Lab Results  Component Value Date   CHOL 159 09/18/2021   HDL 77 09/18/2021   LDLCALC 68 09/18/2021   TRIG 68 09/18/2021   CHOLHDL 2.1 09/18/2021    She has been working on diet and exercise for glucose management, and denies increased appetite, paresthesia of the feet, polydipsia, polyuria, visual disturbances and vomiting. Last A1C in the office was:  Lab Results  Component Value Date   HGBA1C 5.6 09/18/2021   Patient is on Vitamin D supplement.   Lab Results  Component Value Date   VD25OH 40 09/18/2021         Past Medical History:  Diagnosis Date   Allergy    seasonal   GERD (gastroesophageal reflux disease)    Hyperlipidemia    Labile hypertension    Prediabetes    Raynaud's disease    Thyroid disease    hypothyroid   Vitamin D deficiency      Allergies  Allergen Reactions   Penicillins Hives   Sulfa Antibiotics Hives    Current Outpatient Medications on File Prior to Visit  Medication Sig   aspirin 81 MG tablet Take 81 mg by mouth daily.   betamethasone dipropionate 0.05 % cream Apply 1 application topically 3 (three) times daily.   Cetirizine HCl (ZYRTEC ALLERGY PO) Take 10 mg by mouth at bedtime.   Cholecalciferol (VITAMIN D) 2000 UNITS tablet Take 2,000 Units by mouth daily. 6,000 a day   Cinnamon 500 MG capsule Take 2,000 mg by mouth daily.   Cyanocobalamin (VITAMIN B 12 PO) Take 1,000 mcg by mouth daily.   escitalopram (LEXAPRO) 20 MG tablet TAKE ONE TABLET BY MOUTH ONCE DAILY.   famotidine-calcium carbonate-magnesium hydroxide (PEPCID COMPLETE) 10-800-165 MG chewable tablet Chew 1 tablet by mouth daily as needed.   gabapentin (NEURONTIN) 300 MG capsule  TAKE 1 TO 2 CAPSULES 2 TO 3 TIMES DAILY AS NEEDED FOR HOT FLASHES.   levothyroxine (SYNTHROID) 50 MCG tablet Take  1 tablet  Daily  on an empty stomach with only water for 30 minutes & no Antacid meds, Calcium or Magnesium for 4 hours & avoid Biotin       /TAKE ONE TABLET BY MOUTH ONCE DAILY.   Magnesium 400 MG TABS Take 1 tablet by mouth 2 (two) times daily.   Melatonin 10 MG TABS Take by mouth.   Omega-3 Fatty Acids (OMEGA-3 FISH OIL) 1200 MG CAPS Take 2 capsules by mouth daily.   pantoprazole (PROTONIX) 40 MG tablet Take  1 tablet  2 x /day (every 12 hours) for Heartburn & Acid Reflux                                  / (Cancel) TAKE ONE TABLET BY MOUTH ONCE  DAILY.   rosuvastatin (CRESTOR) 20 MG tablet TAKE (1) TABLET BY MOUTH ONCE DAILY.   estradiol (ESTRACE) 1 MG tablet Take 0.5 tablets (0.5 mg total) by mouth daily. (Patient not taking: Reported on 12/19/2021)   meloxicam (MOBIC) 15 MG tablet Take 1 tablet (15 mg total) by mouth daily. (Patient not taking: Reported on 12/19/2021)   progesterone (PROMETRIUM) 100 MG capsule Take 1 capsule (100 mg total) by mouth at bedtime. (Patient not taking: Reported on 12/19/2021)   No current facility-administered medications on file prior to visit.    ROS: Review of Systems  Constitutional:  Negative for chills, diaphoresis, fever, malaise/fatigue and weight loss.  HENT:  Negative for hearing loss and tinnitus.   Eyes:  Negative for blurred vision and double vision.  Respiratory:  Positive for cough. Negative for shortness of breath and wheezing.   Cardiovascular:  Negative for chest pain, palpitations, orthopnea, claudication and leg swelling.  Gastrointestinal:  Negative for abdominal pain, blood in stool, constipation, diarrhea, heartburn, melena, nausea and vomiting.  Genitourinary: Negative.   Musculoskeletal:  Positive for neck pain. Negative for joint pain and myalgias.  Skin:  Negative for rash.       Lesion that is itchy on scalp  Neurological:   Negative for dizziness, tingling, sensory change, weakness and headaches.  Endo/Heme/Allergies:  Negative for polydipsia.  Psychiatric/Behavioral:  Negative for hallucinations, substance abuse and suicidal ideas. The patient has insomnia.   All other systems reviewed and are negative.   Physical Exam:  BP 128/82    Pulse 72    Temp (!) 96.6 F (35.9 C)    Wt 177 lb 6.4 oz (80.5 kg)    LMP 03/02/2013    SpO2 99%    BMI 30.93 kg/m   General Appearance: Well nourished, in no apparent distress. Eyes: PERRLA, EOMs, conjunctiva no swelling or erythema Sinuses: No Frontal/maxillary tenderness ENT/Mouth: Ext aud canals clear, TMs without erythema, bulging. Mask in place; oral exam deferred. Hearing normal.  Neck: Supple, thyroid normal.  Respiratory: Respiratory effort normal, BS equal bilaterally without rales, rhonchi, wheezing or stridor.  Cardio: RRR with no MRGs. Brisk peripheral pulses without edema.  Abdomen: Soft, + BS.  Non tender, no guarding, rebound, hernias, masses. Lymphatics: Non tender without lymphadenopathy.  Musculoskeletal: Full ROM, 5/5 strength, normal gait. Spasm of left trapezius muscle Skin: Warm, dry without rashes. Has <1cm white raised area on scalp.  Neuro: Cranial nerves intact. Normal muscle tone, no cerebellar symptoms. Sensation intact.  Psych: Awake and oriented X 3, depressed/tearful affect, Insight and Judgment appropriate.    A trigger point injection was performed at the site of maximal tenderness in 4 spots of the left trapezius using 1% plain Lidocaine and dexamethasone. This was well tolerated, and followed by moderate relief of pain.   Magda Bernheim, NP 3:39 PM [facility]

## 2021-12-19 ENCOUNTER — Ambulatory Visit (INDEPENDENT_AMBULATORY_CARE_PROVIDER_SITE_OTHER): Payer: 59 | Admitting: Nurse Practitioner

## 2021-12-19 ENCOUNTER — Encounter: Payer: Self-pay | Admitting: Nurse Practitioner

## 2021-12-19 ENCOUNTER — Other Ambulatory Visit: Payer: Self-pay

## 2021-12-19 VITALS — BP 128/82 | HR 72 | Temp 96.6°F | Wt 177.4 lb

## 2021-12-19 DIAGNOSIS — E782 Mixed hyperlipidemia: Secondary | ICD-10-CM | POA: Diagnosis not present

## 2021-12-19 DIAGNOSIS — E039 Hypothyroidism, unspecified: Secondary | ICD-10-CM

## 2021-12-19 DIAGNOSIS — R7309 Other abnormal glucose: Secondary | ICD-10-CM | POA: Diagnosis not present

## 2021-12-19 DIAGNOSIS — E559 Vitamin D deficiency, unspecified: Secondary | ICD-10-CM | POA: Diagnosis not present

## 2021-12-19 DIAGNOSIS — Z79899 Other long term (current) drug therapy: Secondary | ICD-10-CM

## 2021-12-19 DIAGNOSIS — G4700 Insomnia, unspecified: Secondary | ICD-10-CM

## 2021-12-19 DIAGNOSIS — M62838 Other muscle spasm: Secondary | ICD-10-CM

## 2021-12-19 DIAGNOSIS — L989 Disorder of the skin and subcutaneous tissue, unspecified: Secondary | ICD-10-CM

## 2021-12-19 DIAGNOSIS — R0989 Other specified symptoms and signs involving the circulatory and respiratory systems: Secondary | ICD-10-CM

## 2021-12-19 DIAGNOSIS — E669 Obesity, unspecified: Secondary | ICD-10-CM

## 2021-12-19 DIAGNOSIS — K219 Gastro-esophageal reflux disease without esophagitis: Secondary | ICD-10-CM

## 2021-12-19 DIAGNOSIS — R051 Acute cough: Secondary | ICD-10-CM

## 2021-12-19 DIAGNOSIS — F321 Major depressive disorder, single episode, moderate: Secondary | ICD-10-CM

## 2021-12-19 MED ORDER — BENZONATATE 100 MG PO CAPS: 100.0000 mg | ORAL_CAPSULE | Freq: Four times a day (QID) | ORAL | 1 refills | Status: DC | PRN

## 2021-12-19 MED ORDER — CYCLOBENZAPRINE HCL 5 MG PO TABS: 5.0000 mg | ORAL_TABLET | Freq: Three times a day (TID) | ORAL | 0 refills | Status: DC | PRN

## 2021-12-19 NOTE — Patient Instructions (Signed)
Muscle Cramps and Spasms Muscle cramps and spasms occur when a muscle or muscles tighten and you have no control over this tightening (involuntary muscle contraction). They are a common problem and can develop in any muscle. The most common place is in the calf muscles of the leg. Muscle cramps and muscle spasms are both involuntary muscle contractions, but there are some differences between the two: Muscle cramps are painful. They come and go and may last for a few seconds or up to 15 minutes. Muscle cramps are often more forceful and last longer than muscle spasms. Muscle spasms may or may not be painful. They may also last just a few seconds or much longer. Certain medical conditions, such as diabetes or Parkinson's disease, can make it more likely to develop cramps or spasms. However, cramps or spasms are usually not caused by a serious underlying problem. Common causes include: Doing more physical work or exercise than your body is ready for (overexertion). Overuse from repeating certain movements too many times. Remaining in a certain position for a long period of time. Improper preparation, form, or technique while playing a sport or doing an activity. Dehydration. Injury. Side effects of some medicines. Abnormally low levels of the salts and minerals in your blood (electrolytes), especially potassium and calcium. This could happen if you are taking water pills (diuretics) or if you are pregnant. In many cases, the cause of muscle cramps or spasms is not known. Follow these instructions at home: Managing pain and stiffness   Try massaging, stretching, and relaxing the affected muscle. Do this for several minutes at a time. If directed, apply heat to tight or tense muscles as often as told by your health care provider. Use the heat source that your health care provider recommends, such as a moist heat pack or a heating pad. Place a towel between your skin and the heat source. Leave the heat  on for 20-30 minutes. Remove the heat if your skin turns bright red. This is especially important if you are unable to feel pain, heat, or cold. You may have a greater risk of getting burned. If directed, put ice on the affected area. This may help if you are sore or have pain after a cramp or spasm. Put ice in a plastic bag. Place a towel between your skin and the bag. Leave the ice on for 20 minutes, 2-3 times a day. Try taking hot showers or baths to help relax tight muscles. Eating and drinking Drink enough fluid to keep your urine pale yellow. Staying well hydrated may help prevent cramps or spasms. Eat a healthy diet that includes plenty of nutrients to help your muscles function. A healthy diet includes fruits and vegetables, lean protein, whole grains, and low-fat or nonfat dairy products. General instructions If you are having frequent cramps, avoid intense exercise for several days. Take over-the-counter and prescription medicines only as told by your health care provider. Pay attention to any changes in your symptoms. Keep all follow-up visits as told by your health care provider. This is important. Contact a health care provider if: Your cramps or spasms get more severe or happen more often. Your cramps or spasms do not improve over time. Summary Muscle cramps and spasms occur when a muscle or muscles tighten and you have no control over this tightening (involuntary muscle contraction). The most common place for cramps or spasms to occur is in the calf muscles of the leg. Massaging, stretching, and relaxing the affected muscle may  relieve the cramp or spasm. Drink enough fluid to keep your urine pale yellow. Staying well hydrated may help prevent cramps or spasms. This information is not intended to replace advice given to you by your health care provider. Make sure you discuss any questions you have with your health care provider. Document Revised: 06/21/2021 Document Reviewed:  06/21/2021 Elsevier Patient Education  [place].

## 2021-12-20 LAB — CBC WITH DIFFERENTIAL/PLATELET
Absolute Monocytes: 514 cells/uL (ref 200–950)
Basophils Absolute: 32 cells/uL (ref 0–200)
Basophils Relative: 0.6 %
Eosinophils Absolute: 239 cells/uL (ref 15–500)
Eosinophils Relative: 4.5 %
HCT: 41.8 % (ref 35.0–45.0)
Hemoglobin: 13.6 g/dL (ref 11.7–15.5)
Lymphs Abs: 1415 cells/uL (ref 850–3900)
MCH: 29.1 pg (ref 27.0–33.0)
MCHC: 32.5 g/dL (ref 32.0–36.0)
MCV: 89.3 fL (ref 80.0–100.0)
MPV: 9.8 fL (ref 7.5–12.5)
Monocytes Relative: 9.7 %
Neutro Abs: 3101 cells/uL (ref 1500–7800)
Neutrophils Relative %: 58.5 %
Platelets: 248 10*3/uL (ref 140–400)
RBC: 4.68 10*6/uL (ref 3.80–5.10)
RDW: 12.5 % (ref 11.0–15.0)
Total Lymphocyte: 26.7 %
WBC: 5.3 10*3/uL (ref 3.8–10.8)

## 2021-12-20 LAB — COMPLETE METABOLIC PANEL WITH GFR
AG Ratio: 1.5 (calc) (ref 1.0–2.5)
ALT: 12 U/L (ref 6–29)
AST: 20 U/L (ref 10–35)
Albumin: 4.4 g/dL (ref 3.6–5.1)
Alkaline phosphatase (APISO): 54 U/L (ref 37–153)
BUN: 11 mg/dL (ref 7–25)
CO2: 33 mmol/L — ABNORMAL HIGH (ref 20–32)
Calcium: 9.6 mg/dL (ref 8.6–10.4)
Chloride: 104 mmol/L (ref 98–110)
Creat: 0.9 mg/dL (ref 0.50–1.03)
Globulin: 3 g/dL (calc) (ref 1.9–3.7)
Glucose, Bld: 88 mg/dL (ref 65–99)
Potassium: 4.7 mmol/L (ref 3.5–5.3)
Sodium: 141 mmol/L (ref 135–146)
Total Bilirubin: 0.4 mg/dL (ref 0.2–1.2)
Total Protein: 7.4 g/dL (ref 6.1–8.1)
eGFR: 74 mL/min/{1.73_m2} (ref >=60)

## 2021-12-20 LAB — LIPID PANEL
Cholesterol: 164 mg/dL (ref <200)
HDL: 69 mg/dL (ref >=50)
LDL Cholesterol (Calc): 79 mg/dL (calc)
Non-HDL Cholesterol (Calc): 95 mg/dL (calc) (ref <130)
Total CHOL/HDL Ratio: 2.4 (calc) (ref <5.0)
Triglycerides: 79 mg/dL (ref <150)

## 2021-12-20 LAB — MAGNESIUM: Magnesium: 2.3 mg/dL (ref 1.5–2.5)

## 2021-12-24 ENCOUNTER — Other Ambulatory Visit: Payer: Self-pay | Admitting: Nurse Practitioner

## 2021-12-24 ENCOUNTER — Encounter: Payer: Self-pay | Admitting: Nurse Practitioner

## 2021-12-24 DIAGNOSIS — M62838 Other muscle spasm: Secondary | ICD-10-CM

## 2021-12-24 MED ORDER — PREDNISONE 20 MG PO TABS: ORAL_TABLET | ORAL | 0 refills | Status: AC

## 2022-01-18 ENCOUNTER — Other Ambulatory Visit: Payer: Self-pay | Admitting: Adult Health

## 2022-01-18 DIAGNOSIS — F321 Major depressive disorder, single episode, moderate: Secondary | ICD-10-CM

## 2022-03-12 ENCOUNTER — Other Ambulatory Visit: Payer: Self-pay | Admitting: Nurse Practitioner

## 2022-03-12 DIAGNOSIS — F321 Major depressive disorder, single episode, moderate: Secondary | ICD-10-CM

## 2022-03-31 ENCOUNTER — Encounter: Payer: Self-pay | Admitting: Gastroenterology

## 2022-04-01 ENCOUNTER — Other Ambulatory Visit: Payer: Self-pay | Admitting: Internal Medicine

## 2022-04-03 ENCOUNTER — Ambulatory Visit: Payer: 59 | Admitting: Podiatry

## 2022-04-03 ENCOUNTER — Encounter: Payer: Self-pay | Admitting: Podiatry

## 2022-04-03 ENCOUNTER — Ambulatory Visit (INDEPENDENT_AMBULATORY_CARE_PROVIDER_SITE_OTHER): Payer: 59

## 2022-04-03 DIAGNOSIS — M7989 Other specified soft tissue disorders: Secondary | ICD-10-CM | POA: Diagnosis not present

## 2022-04-03 DIAGNOSIS — M778 Other enthesopathies, not elsewhere classified: Secondary | ICD-10-CM

## 2022-04-03 NOTE — Progress Notes (Signed)
Beth Miranda presents today with a chief complaint of pain to the fifth met base of the left foot states that its been somewhat painful since surgery and seems to have gotten worse that she refers to the fifth metatarsal base and the plantar area as she points to it.  She states that it feels like she is walking on a rock about 15 minutes after she has been on it. ? ?Objective: Vital signs are stable she is alert and oriented x3 there is no erythema edema cellulitis drainage or odor though there is a palpable nonpulsatile mass beneath the fifth metatarsal base laterally she no longer has pain but this area of firmness is demonstrated on radiograph as thicker tissue and more dense tissue.  Otherwise radiographically her foot is in good condition with complete resection of the lateral aspect of the bone fifth met base but she still has the plantar tubercle. ? ?Assessment: Cannot rule out a chronic bursitis or fibroma scar tissue to the fifth met base left. ? ?Plan: At this point I am requesting MRI with contrast to evaluate for soft tissue mass subfifth met base left foot with a marker.  This is for surgical consideration ?

## 2022-04-15 ENCOUNTER — Ambulatory Visit
Admission: RE | Admit: 2022-04-15 | Discharge: 2022-04-15 | Disposition: A | Discharge status: Home / Self Care | Payer: 59 | Source: Ambulatory Visit | Attending: Podiatry | Admitting: Podiatry

## 2022-04-15 DIAGNOSIS — M7989 Other specified soft tissue disorders: Secondary | ICD-10-CM

## 2022-04-15 MED ORDER — GADOBENATE DIMEGLUMINE 529 MG/ML IV SOLN
15.0000 mL | Freq: Once | INTRAVENOUS | Status: AC | PRN
  Administered 2022-04-15: 15 mL via INTRAVENOUS

## 2022-04-29 ENCOUNTER — Ambulatory Visit: Payer: 59 | Admitting: Podiatry

## 2022-04-29 DIAGNOSIS — D2122 Benign neoplasm of connective and other soft tissue of left lower limb, including hip: Secondary | ICD-10-CM

## 2022-04-29 MED ORDER — DEXAMETHASONE SODIUM PHOSPHATE 120 MG/30ML IJ SOLN
2.0000 mg | Freq: Once | INTRAMUSCULAR | Status: AC
  Administered 2022-04-29: 2 mg via INTRA_ARTICULAR

## 2022-04-29 NOTE — Progress Notes (Signed)
She presents today for follow-up of her MRI of her left foot to the plantar lateral aspect of the fifth metatarsal base left.  States that is still moderately tender. ? ?Objective: Vital signs are stable she is alert and oriented x3.  MRI does demonstrate fibrosis or scar tissue to the plantar lateral aspect and plantar aspect of the fifth metatarsal base of the left foot. ? ?Assessment: Plantar fibrosis left foot. ? ?Plan: We injected the area today with Kenalog and local anesthetic to the point of maximal tenderness allow for 6 weeks for this to reduce numbing put another injection in. ?

## 2022-05-08 ENCOUNTER — Other Ambulatory Visit: Payer: Self-pay | Admitting: Internal Medicine

## 2022-05-15 ENCOUNTER — Encounter: Payer: Self-pay | Admitting: Obstetrics & Gynecology

## 2022-05-15 ENCOUNTER — Other Ambulatory Visit (HOSPITAL_COMMUNITY)
Admission: RE | Admit: 2022-05-15 | Discharge: 2022-05-15 | Disposition: A | Discharge status: Home / Self Care | Payer: 59 | Source: Ambulatory Visit | Attending: Obstetrics & Gynecology | Admitting: Obstetrics & Gynecology

## 2022-05-15 ENCOUNTER — Ambulatory Visit (INDEPENDENT_AMBULATORY_CARE_PROVIDER_SITE_OTHER): Payer: 59 | Admitting: Obstetrics & Gynecology

## 2022-05-15 VITALS — BP 120/76 | HR 74 | Resp 16 | Ht 63.5 in | Wt 178.0 lb

## 2022-05-15 DIAGNOSIS — Z01419 Encounter for gynecological examination (general) (routine) without abnormal findings: Secondary | ICD-10-CM

## 2022-05-15 DIAGNOSIS — M8589 Other specified disorders of bone density and structure, multiple sites: Secondary | ICD-10-CM

## 2022-05-15 DIAGNOSIS — Z780 Asymptomatic menopausal state: Secondary | ICD-10-CM

## 2022-05-15 NOTE — Progress Notes (Signed)
Beth Miranda [birth year] [id number]   History:    61 y.o. G0 Married.  Mother died in Apr 01, 2020.   RP:  Established patient presenting for annual gyn exam    HPI:  Postmenopause, well on no HRT x last year. No PMB. Rare hot flushes.  No pelvic pain. Abstinent. Pap neg 2020.  No h/o abnormal Pap.  Pap reflex today.  Breasts normal.  Screening mammo 10/2021 Neg.  Urine/BMs normal.  BMI increased to 28.94. 3rd foot surgery coming up.  Good nutrition.  Health labs with Fam MD.  Beth Miranda to schedule this year. Bone Density 05/2020 very mild osteopenia.  Will repeat BD at 3 years.     Past medical history,surgical history, family history and social history were all reviewed and documented in the EPIC chart.  Gynecologic History Patient's last menstrual period was 03/02/2013.  Obstetric History OB History  Gravida Para Term Preterm AB Living  0 0 0 0 0 0  SAB IAB Ectopic Multiple Live Births  0 0 0 0      ROS: A ROS was performed and pertinent positives and negatives are included in the history.  GENERAL: No fevers or chills. HEENT: No change in vision, no earache, sore throat or sinus congestion. NECK: No pain or stiffness. CARDIOVASCULAR: No chest pain or pressure. No palpitations. PULMONARY: No shortness of breath, cough or wheeze. GASTROINTESTINAL: No abdominal pain, nausea, vomiting or diarrhea, melena or bright red blood per rectum. GENITOURINARY: No urinary frequency, urgency, hesitancy or dysuria. MUSCULOSKELETAL: No joint or muscle pain, no back pain, no recent trauma. DERMATOLOGIC: No rash, no itching, no lesions. ENDOCRINE: No polyuria, polydipsia, no heat or cold intolerance. No recent change in weight. HEMATOLOGICAL: No anemia or easy bruising or bleeding. NEUROLOGIC: No headache, seizures, numbness, tingling or weakness. PSYCHIATRIC: No depression, no loss of interest in normal activity or change in sleep pattern.     Exam:   BP 120/76   Pulse 74   Resp 16   Ht 5' 3.5"  (1.613 m)   Wt 178 lb (80.7 kg)   LMP 03/02/2013   BMI 31.04 kg/m   Body mass index is 31.04 kg/m.  General appearance : Well developed well nourished female. No acute distress HEENT: Eyes: no retinal hemorrhage or exudates,  Neck supple, trachea midline, no carotid bruits, no thyroidmegaly Lungs: Clear to auscultation, no rhonchi or wheezes, or rib retractions  Heart: Regular rate and rhythm, no murmurs or gallops Breast:Examined in sitting and supine position were symmetrical in appearance, no palpable masses or tenderness,  no skin retraction, no nipple inversion, no nipple discharge, no skin discoloration, no axillary or supraclavicular lymphadenopathy Abdomen: no palpable masses or tenderness, no rebound or guarding Extremities: no edema or skin discoloration or tenderness  Pelvic: Vulva: Normal             Vagina: No gross lesions or discharge  Cervix: No gross lesions or discharge.  Pap reflex done.  Uterus  AV, normal size, shape and consistency, non-tender and mobile  Adnexa  Without masses or tenderness  Anus: Normal   Assessment/Plan:  61 y.o. female for annual exam   1. Encounter for routine gynecological examination with Papanicolaou smear of cervix Postmenopause, well on no HRT x last year. No PMB. Rare hot flushes.  No pelvic pain. Abstinent. Pap neg 2020.  No h/o abnormal Pap.  Pap reflex today.  Breasts normal.  Screening mammo 10/2021 Neg.  Urine/BMs normal.  BMI increased to 28.94. 3rd  foot surgery coming up.  Good nutrition.  Health labs with Fam MD.  Beth Miranda to schedule this year. Bone Density 05/2020 very mild osteopenia.  Will repeat BD at 3 years.   - Cytology - PAP( [facility])  2. Postmenopause Postmenopause, well on no HRT x last year. No PMB. Rare hot flushes.  No pelvic pain. Abstinent.   3. Osteopenia of multiple sites Bone Density 05/2020 very mild osteopenia.  Will repeat BD at 3 years.  Vit D, Ca++ 1.5 g/d total, wt bearing activities after healing  from Lt foot surgery.  Other orders - VITAMIN D PO; Take 5,000 Int'l Units by mouth. - CINNAMON PO; Take by mouth. - Ferrous Sulfate (IRON PO); Take by mouth 3 (three) times a week.   Beth Bruins MD, 2:06 PM 05/15/2022

## 2022-05-16 ENCOUNTER — Ambulatory Visit (AMBULATORY_SURGERY_CENTER): Payer: Self-pay | Admitting: *Deleted

## 2022-05-16 VITALS — Ht 63.5 in | Wt 177.2 lb

## 2022-05-16 DIAGNOSIS — Z1211 Encounter for screening for malignant neoplasm of colon: Secondary | ICD-10-CM

## 2022-05-16 MED ORDER — NA SULFATE-K SULFATE-MG SULF 17.5-3.13-1.6 GM/177ML PO SOLN: 1.0000 | Freq: Once | ORAL | 0 refills | Status: AC

## 2022-05-16 NOTE — Progress Notes (Signed)
.  No egg or soy allergy  No home oxygen use or problems with anesthesia  No medications for weight loss taken  emmi information given    No trouble with anesthesia, denies being told they were difficult to intubate, or hx/fam hx of malignant hyperthermia per pt   Pt denies issus with prep in the past- denies need for Zofran

## 2022-05-19 LAB — CYTOLOGY - PAP: Diagnosis: NEGATIVE

## 2022-05-25 ENCOUNTER — Encounter: Payer: Self-pay | Admitting: Obstetrics & Gynecology

## 2022-05-27 ENCOUNTER — Other Ambulatory Visit: Payer: Self-pay | Admitting: Internal Medicine

## 2022-05-27 DIAGNOSIS — F321 Major depressive disorder, single episode, moderate: Secondary | ICD-10-CM

## 2022-06-03 ENCOUNTER — Encounter: Payer: Self-pay | Admitting: Gastroenterology

## 2022-06-06 ENCOUNTER — Encounter: Payer: Self-pay | Admitting: Gastroenterology

## 2022-06-06 ENCOUNTER — Ambulatory Visit (AMBULATORY_SURGERY_CENTER): Payer: 59 | Admitting: Gastroenterology

## 2022-06-06 VITALS — BP 128/68 | HR 69 | Temp 96.8°F | Resp 20 | Ht 63.5 in | Wt 177.0 lb

## 2022-06-06 DIAGNOSIS — Z1211 Encounter for screening for malignant neoplasm of colon: Secondary | ICD-10-CM

## 2022-06-06 MED ORDER — SODIUM CHLORIDE 0.9 % IV SOLN: 500.0000 mL | Freq: Once | INTRAVENOUS | Status: DC

## 2022-06-06 NOTE — Progress Notes (Signed)
PT taken to PACU. Monitors in place. VSS. Report given to RN. 

## 2022-06-09 ENCOUNTER — Telehealth: Payer: Self-pay

## 2022-06-09 NOTE — Telephone Encounter (Signed)
Message left

## 2022-06-10 ENCOUNTER — Ambulatory Visit: Payer: 59 | Admitting: Podiatry

## 2022-06-10 DIAGNOSIS — D2122 Benign neoplasm of connective and other soft tissue of left lower limb, including hip: Secondary | ICD-10-CM

## 2022-06-10 MED ORDER — TRIAMCINOLONE ACETONIDE 40 MG/ML IJ SUSP
20.0000 mg | Freq: Once | INTRAMUSCULAR | Status: AC
  Administered 2022-06-10: 20 mg

## 2022-06-23 ENCOUNTER — Other Ambulatory Visit: Payer: Self-pay | Admitting: Internal Medicine

## 2022-07-07 ENCOUNTER — Other Ambulatory Visit: Payer: Self-pay | Admitting: Internal Medicine

## 2022-07-17 ENCOUNTER — Other Ambulatory Visit: Payer: Self-pay | Admitting: Internal Medicine

## 2022-07-17 DIAGNOSIS — F321 Major depressive disorder, single episode, moderate: Secondary | ICD-10-CM

## 2022-07-22 ENCOUNTER — Ambulatory Visit: Payer: 59 | Admitting: Podiatry

## 2022-08-21 ENCOUNTER — Other Ambulatory Visit: Payer: Self-pay | Admitting: Internal Medicine

## 2022-08-26 ENCOUNTER — Other Ambulatory Visit: Payer: Self-pay | Admitting: Internal Medicine

## 2022-08-26 ENCOUNTER — Other Ambulatory Visit: Payer: Self-pay | Admitting: Nurse Practitioner

## 2022-08-26 ENCOUNTER — Telehealth: Payer: Self-pay | Admitting: Nurse Practitioner

## 2022-08-26 DIAGNOSIS — F321 Major depressive disorder, single episode, moderate: Secondary | ICD-10-CM

## 2022-08-26 MED ORDER — ESCITALOPRAM OXALATE 20 MG PO TABS: 20.0000 mg | ORAL_TABLET | Freq: Every day | ORAL | 5 refills | Status: DC

## 2022-08-26 MED ORDER — ESCITALOPRAM OXALATE 20 MG PO TABS: ORAL_TABLET | ORAL | 3 refills | Status: DC

## 2022-08-26 MED ORDER — ESCITALOPRAM OXALATE 20 MG PO TABS: 20.0000 mg | ORAL_TABLET | Freq: Every day | ORAL | 0 refills | Status: DC

## 2022-08-26 NOTE — Telephone Encounter (Signed)
Done

## 2022-08-26 NOTE — Telephone Encounter (Signed)
Patient is requesting a refill on Lexapro to [facility] in [place].

## 2022-09-01 ENCOUNTER — Other Ambulatory Visit: Payer: Self-pay | Admitting: Internal Medicine

## 2022-09-01 DIAGNOSIS — Z1231 Encounter for screening mammogram for malignant neoplasm of breast: Secondary | ICD-10-CM

## 2022-10-06 ENCOUNTER — Other Ambulatory Visit: Payer: Self-pay | Admitting: Internal Medicine

## 2022-10-17 ENCOUNTER — Ambulatory Visit
Admission: RE | Admit: 2022-10-17 | Discharge: 2022-10-17 | Disposition: A | Discharge status: Home / Self Care | Payer: 59 | Source: Ambulatory Visit | Attending: Internal Medicine | Admitting: Internal Medicine

## 2022-10-17 DIAGNOSIS — Z1231 Encounter for screening mammogram for malignant neoplasm of breast: Secondary | ICD-10-CM

## 2022-10-21 ENCOUNTER — Other Ambulatory Visit: Payer: Self-pay | Admitting: Internal Medicine

## 2022-10-21 DIAGNOSIS — R928 Other abnormal and inconclusive findings on diagnostic imaging of breast: Secondary | ICD-10-CM

## 2022-10-30 NOTE — Progress Notes (Incomplete)
Annual Screening/Preventative Visit & Comprehensive Evaluation &  Examination  Future Appointments  Date Time Provider Department  10/31/2022 11:00 AM Unk Pinto, MD [name]  10/31/2022  2:50 PM [facility] DIAG TOMO 2 [facility]  10/31/2022  3:00 PM [facility] [place] 2 [facility]  05/29/2023  8:00 AM Princess Bruins, MD [name]  11/06/2023 11:00 AM Unk Pinto, MD [name]          This very nice 61 y.o. MWF presents for a Screening /Preventative Visit & comprehensive evaluation and management of multiple medical co-morbidities.  Patient has been followed for labile HTN, HLD, Prediabetes  and Vitamin D Deficiency. Patient's GERD is controlled on her meds.     [[ Copied:  Patient has remote  hx/o sero(+) Mixed Connective Disease Syndrome dating back to the 1980's & w/associated Raynauds' syndrome which has been in remission for many years.  ]]         Labile HTN predates  since 2000.  Patient's BP has been controlled at home and patient denies any cardiac symptoms as chest pain, palpitations, shortness of breath, dizziness or ankle swelling. Today's           Patient's hyperlipidemia is controlled with diet and Rosuvastatin. Patient denies myalgias or other medication SE's. Last lipids were not at goal :  Lab Results  Component Value Date   CHOL 164 12/19/2021   HDL 69 12/19/2021   LDLCALC 79 12/19/2021   TRIG 79 12/19/2021   CHOLHDL 2.4 12/19/2021         Patient has hx/o prediabetes (A1c 6.0% /2012 & 5.9% /2016)  and patient denies reactive hypoglycemic symptoms, visual blurring, diabetic polys or paresthesias. Last A1c was normal & at goal :  Lab Results  Component Value Date   HGBA1C 5.6 09/18/2021     In 2010, Patient was dx'd Hypothyroid & initiated on replacement  therapy.          Patient has history of Vitamin D Deficiency and last Vitamin D was still low :  Lab Results  Component Value Date   VD25OH 40 09/18/2021      Current Outpatient  Medications  Medication Instructions  . Aspirin  81 mg Daily  . betamethasone diprop 8.14 % crm 1 application , Topical, 3 times daily  . Cetirizine    10 mg Daily at bedtime  . CINNAMON Oral  . VITAMIN B 12     1,000 mcg Daily  . cyclobenzaprine   5 mg  3 times daily PRN  . LEXAPRO 20 MG tablet Take 1 tablet  Daily    . Ferrous Sulfate  Oral, 3 times weekly  . gabapentin 300 MG capsule TAKE 1 TO 2 CAPSULES 2 TO 3 TIMES DAILY AS NEEDED   . levothyroxine  50 MCG tablet TAKE ONE TABLET  DAILY   . Magnesium 400 MG TABS 1 tablet, Oral, 2 times daily  . Melatonin 10 MG TABS daily  . Omega-3  FISH OIL 1200 MG  2 capsules, Oral, Daily  . pantoprazole 40 MG tablet Take  1 tablet  2 x /day (every 12 hours)   . rosuvastatin  20 MG tablet TAKE (1) TABLET  DAILY.  Marland Kitchen VITAMIN D    5,000 Units Daily     Allergies  Allergen Reactions  . Penicillins Hives  . Sulfa Antibiotics Hives     Past Medical History:  Diagnosis Date  . Allergy    seasonal  . GERD (gastroesophageal reflux disease)   .  Hyperlipidemia   . Labile hypertension   . Prediabetes   . Raynaud's disease   . Thyroid disease    hypothyroid  . Vitamin D deficiency      Health Maintenance  Topic Date Due  . COVID-19 Vaccine (3 - Pfizer risk series) 04/08/2020  . INFLUENZA VACCINE  07/15/2021  . MAMMOGRAM  09/17/2021  . PAP SMEAR-Modifier  05/09/2022  . COLONOSCOPY  05/28/2022  . TETANUS/TDAP  07/02/2027  . Hepatitis C Screening  Completed  . HIV Screening  Completed  . Zoster Vaccines- Shingrix  Completed  . HPV VACCINES  Aged Out     Immunization History  Administered Date(s) Administered  . Hepatitis B 06/06/2002, 07/07/2002, 12/29/2002  . Influenza Inj Mdck Quad  11/28/2020  . Influenza 10/14/2018  . PFIZER SARS-COV-2 Vacc 02/19/2020, 03/11/2020  . PPD Test 07/26/2018, 08/15/2019, 08/30/2020  . Pneumococcal-23 12/15/1992  . Td 01/06/2007  . Tdap 07/01/2017  . Zoster Recombinat (Shingrix) 03/01/2018,  05/21/2018     Last Colon - 06/06/2032 -  Dr Jackquline Denmark -> Recc 10 year f/u due June 2043    [facility] - Next scheduled for 10/31/2022    Past Surgical History:  Procedure Laterality Date  . FOOT SURGERY  09/28/2020   TENDON REPAIR   . TONSILLECTOMY       Family History  Problem Relation Age of Onset  . Pancreatic cancer Paternal Grandmother   . Arthritis Mother        Living  . COPD Mother   . Asthma Mother   . Hypertension Father        Living  . Diabetes Father   . Kidney disease Father        Stones  . Hyperlipidemia Father   . Other Brother        Deceased  . Breast cancer Neg Hx      Social History   Tobacco Use  . Smoking status: Never  . Smokeless tobacco: Never  Vaping Use  . Vaping Use: Never used  Substance Use Topics  . Alcohol use: Yes    Alcohol/week: 1.0 standard drink    Types: 1 Standard drinks or equivalent per week    Comment: 2 glasses  per week  . Drug use: No      ROS Constitutional: Denies fever, chills, weight loss/gain, headaches, insomnia,  night sweats, and change in appetite. Does c/o fatigue. Eyes: Denies redness, blurred vision, diplopia, discharge, itchy, watery eyes.  ENT: Denies discharge, congestion, post nasal drip, epistaxis, sore throat, earache, hearing loss, dental pain, Tinnitus, Vertigo, Sinus pain, snoring.  Cardio: Denies chest pain, palpitations, irregular heartbeat, syncope, dyspnea, diaphoresis, orthopnea, PND, claudication, edema Respiratory: denies cough, dyspnea, DOE, pleurisy, hoarseness, laryngitis, wheezing.  Gastrointestinal: Denies dysphagia, heartburn, reflux, water brash, pain, cramps, nausea, vomiting, bloating, diarrhea, constipation, hematemesis, melena, hematochezia, jaundice, hemorrhoids Genitourinary: Denies dysuria, frequency, urgency, nocturia, hesitancy, discharge, hematuria, flank pain Breast: Breast lumps, nipple discharge, bleeding.  Musculoskeletal: Denies arthralgia, myalgia, stiffness,  Jt. Swelling, pain, limp, and strain/sprain. Denies falls. Skin: Denies puritis, rash, hives, warts, acne, eczema, changing in skin lesion Neuro: No weakness, tremor, incoordination, spasms, paresthesia, pain Psychiatric: Denies confusion, memory loss, sensory loss. Denies Depression. Endocrine: Denies change in weight, skin, hair change, nocturia, and paresthesia, diabetic polys, visual blurring, hyper / hypo glycemic episodes.  Heme/Lymph: No excessive bleeding, bruising, enlarged lymph nodes.  Physical Exam  LMP 03/02/2013   General Appearance: Well nourished, well groomed and in no apparent distress.  Eyes: PERRLA, EOMs,  conjunctiva no swelling or erythema, normal fundi and vessels. Sinuses: No frontal/maxillary tenderness ENT/Mouth: EACs patent / TMs  nl. Nares clear without erythema, swelling, mucoid exudates. Oral hygiene is good. No erythema, swelling, or exudate. Tongue normal, non-obstructing. Tonsils not swollen or erythematous. Hearing normal.  Neck: Supple, thyroid not palpable. No bruits, nodes or JVD. Respiratory: Respiratory effort normal.  BS equal and clear bilateral without rales, rhonci, wheezing or stridor. Cardio: Heart sounds are normal with regular rate and rhythm and no murmurs, rubs or gallops. Peripheral pulses are normal and equal bilaterally without edema. No aortic or femoral bruits. Chest: symmetric with normal excursions and percussion. Breasts: Symmetric, without lumps, nipple discharge, retractions, or fibrocystic changes.  Abdomen: Flat, soft with bowel sounds active. Nontender, no guarding, rebound, hernias, masses, or organomegaly.  Lymphatics: Non tender without lymphadenopathy.  Genitourinary:  Musculoskeletal: Full ROM all peripheral extremities, joint stability, 5/5 strength, and normal gait. Skin: Warm and dry without rashes, lesions, cyanosis, clubbing or  ecchymosis.  Neuro: Cranial nerves intact, reflexes equal bilaterally. Normal muscle tone, no  cerebellar symptoms. Sensation intact.  Pysch: Alert and oriented X 3, normal affect, Insight and Judgment appropriate.    Assessment and Plan  1. Annual Preventative Screening Examination   2. Labile hypertension  - EKG 12-Lead - [place], RETROPERITNL ABD,  LTD - Urinalysis, Routine w reflex microscopic - Microalbumin / creatinine urine ratio  3. Hyperlipidemia, mixed  - EKG 12-Lead - [place], RETROPERITNL ABD,  LTD  4. Abnormal glucose  - EKG 12-Lead - [place], RETROPERITNL ABD,  LTD - Hemoglobin A1c - Insulin, random  5. Vitamin D deficiency  - VITAMIN D 25 Hydroxy (Vit-D Deficiency, Fractures)  6. Hypothyroidism, unspecified type  - TSH  7. Screening for colorectal cancer  - POC Hemoccult Bld/Stl (3-Cd Home Screen); Future  8. Screening examination for pulmonary tuberculosis  - TB Skin Test  9. Screening for ischemic heart disease  - EKG 12-Lead  10. FH: hypertension  - EKG 12-Lead - [place], RETROPERITNL ABD,  LTD  11. Screening for AAA (aortic abdominal aneurysm)   12. Fatigue, unspecified type  - Iron, Total/Total Iron Binding Cap - Vitamin B12 - CBC with Differential/Platelet - TSH  13. Medication management  - Urinalysis, Routine w reflex microscopic - Microalbumin / creatinine urine ratio - CBC with Differential/Platelet - COMPLETE METABOLIC PANEL WITH GFR - Magnesium - Lipid panel - TSH - Hemoglobin A1c - Insulin, random - VITAMIN D 25 Hydroxy   14. Visit for TB skin test   15. Gastroesophageal reflux disease  - pantoprazole  40 MG tablet; Take  1 tablet  2 x /day  Disp: 180 tablet; Refill: 3             Patient was counseled in prudent diet to achieve/maintain BMI less than 25 for weight control, BP monitoring, regular exercise and medications. Discussed med's effects and SE's. Screening labs and tests as requested with regular follow-up as recommended. Over 40 minutes of exam, counseling, chart review and high complex critical decision  making was performed.   Kirtland Bouchard, MD

## 2022-10-30 NOTE — Progress Notes (Signed)
Annual Screening/Preventative Visit & Comprehensive Evaluation &  Examination   Future Appointments  Date Time Provider Department  10/31/2022 11:00 AM Unk Pinto, MD [name]  10/31/2022  2:50 PM [facility] DIAG TOMO 2 [facility]  10/31/2022  3:00 PM [facility] [place] 2 [facility]  05/29/2023  8:00 AM Princess Bruins, MD [name]  11/06/2023 11:00 AM Unk Pinto, MD [name]         This very nice 61 y.o. MWF presents for a Screening /Preventative Visit & comprehensive evaluation and management of multiple medical co-morbidities.  Patient has been followed for labile HTN, HLD, Prediabetes  and Vitamin D Deficiency. Patient's GERD is controlled on her meds.     [[ Copied:  Patient has remote  hx/o sero(+) Mixed Connective Disease Syndrome dating back to the 1980's & w/associated Raynauds' syndrome which has been in remission for many years.  ]]         Labile HTN predates  since 2000.  Patient's BP has been controlled at home and patient denies any cardiac symptoms as chest pain, palpitations, shortness of breath, dizziness or ankle swelling. Today's BP is at goal -  124/80  .        Patient's hyperlipidemia is controlled with diet and Rosuvastatin. Patient denies myalgias or other medication SE's. Last lipids were not at goal :  Lab Results  Component Value Date   CHOL 164 12/19/2021   HDL 69 12/19/2021   LDLCALC 79 12/19/2021   TRIG 79 12/19/2021   CHOLHDL 2.4 12/19/2021         Patient has hx/o prediabetes (A1c 6.0% /2012 & 5.9% /2016)  and patient denies reactive hypoglycemic symptoms, visual blurring, diabetic polys or paresthesias. Last A1c was normal & at goal :  Lab Results  Component Value Date   HGBA1C 5.6 09/18/2021     In 2010, Patient was dx'd Hypothyroid & initiated on replacement  therapy.          Patient has history of Vitamin D Deficiency and last Vitamin D was still low :  Lab Results  Component Value Date   VD25OH 106 09/18/2021       Current Outpatient Medications  Medication Instructions   Aspirin  81 mg Daily   betamethasone diprop 6.57 % crm 1 application , Topical, 3 times daily   Cetirizine    10 mg Daily at bedtime   CINNAMON Oral   VITAMIN B 12     1,000 mcg Daily   cyclobenzaprine   5 mg  3 times daily PRN   LEXAPRO 20 MG tablet Take 1 tablet  Daily     Ferrous Sulfate  Oral, 3 times weekly   gabapentin 300 MG capsule TAKE 1 TO 2 CAPSULES 2 TO 3 TIMES DAILY AS NEEDED    levothyroxine  50 MCG tablet TAKE ONE TABLET  DAILY    Magnesium 400 MG TABS 1 tablet, Oral, 2 times daily   Melatonin 10 MG TABS daily   Omega-3  FISH OIL 1200 MG  2 capsules, Oral, Daily   pantoprazole 40 MG tablet Take  1 tablet  2 x /day (every 12 hours)    rosuvastatin  20 MG tablet TAKE (1) TABLET  DAILY.   VITAMIN D    5,000 Units Daily     Allergies  Allergen Reactions   Penicillins Hives   Sulfa Antibiotics Hives     Past Medical History:  Diagnosis Date   Allergy    seasonal   GERD (gastroesophageal  reflux disease)    Hyperlipidemia    Labile hypertension    Prediabetes    Raynaud's disease    Thyroid disease    hypothyroid   Vitamin D deficiency      Health Maintenance  Topic Date Due   COVID-19 Vaccine (3 - Pfizer risk series) 04/08/2020   INFLUENZA VACCINE  07/15/2021   MAMMOGRAM  09/17/2021   PAP SMEAR-Modifier  05/09/2022   COLONOSCOPY  05/28/2022   TETANUS/TDAP  07/02/2027   Hepatitis C Screening  Completed   HIV Screening  Completed   Zoster Vaccines- Shingrix  Completed   HPV VACCINES  Aged Out     Immunization History  Administered Date(s) Administered   Hepatitis B 06/06/2002, 07/07/2002, 12/29/2002   Influenza Inj Mdck Quad  11/28/2020   Influenza 10/14/2018   PFIZER SARS-COV-2 Vacc 02/19/2020, 03/11/2020   PPD Test 07/26/2018, 08/15/2019, 08/30/2020   Pneumococcal-23 12/15/1992   Td 01/06/2007   Tdap 07/01/2017   Zoster Recombinat (Shingrix) 03/01/2018, 05/21/2018     Last  Colon - 06/06/2022 -  Dr Jackquline Denmark -> Recc 10 year f/u due June 2043    [facility] - Next scheduled for 10/31/2022    Past Surgical History:  Procedure Laterality Date   FOOT SURGERY  09/28/2020   TENDON REPAIR    TONSILLECTOMY       Family History  Problem Relation Age of Onset   Pancreatic cancer Paternal Grandmother    Arthritis Mother        Living   COPD Mother    Asthma Mother    Hypertension Father        Living   Diabetes Father    Kidney disease Father        Stones   Hyperlipidemia Father    Other Brother        Deceased   Breast cancer Neg Hx      Social History   Tobacco Use   Smoking status: Never   Smokeless tobacco: Never  Vaping Use   Vaping Use: Never used  Substance Use Topics   Alcohol use: Yes    Alcohol/week: 1.0 standard drink    Types: 1 Standard drinks or equivalent per week    Comment: 2 glasses  per week   Drug use: No      ROS Constitutional: Denies fever, chills, weight loss/gain, headaches, insomnia,  night sweats, and change in appetite. Does c/o fatigue. Eyes: Denies redness, blurred vision, diplopia, discharge, itchy, watery eyes.  ENT: Denies discharge, congestion, post nasal drip, epistaxis, sore throat, earache, hearing loss, dental pain, Tinnitus, Vertigo, Sinus pain, snoring.  Cardio: Denies chest pain, palpitations, irregular heartbeat, syncope, dyspnea, diaphoresis, orthopnea, PND, claudication, edema Respiratory: denies cough, dyspnea, DOE, pleurisy, hoarseness, laryngitis, wheezing.  Gastrointestinal: Denies dysphagia, heartburn, reflux, water brash, pain, cramps, nausea, vomiting, bloating, diarrhea, constipation, hematemesis, melena, hematochezia, jaundice, hemorrhoids Genitourinary: Denies dysuria, frequency, urgency, nocturia, hesitancy, discharge, hematuria, flank pain Breast: Breast lumps, nipple discharge, bleeding.  Musculoskeletal: Denies arthralgia, myalgia, stiffness, Jt. Swelling, pain, limp, and  strain/sprain. Denies falls. Skin: Denies puritis, rash, hives, warts, acne, eczema, changing in skin lesion Neuro: No weakness, tremor, incoordination, spasms, paresthesia, pain Psychiatric: Denies confusion, memory loss, sensory loss. Denies Depression. Endocrine: Denies change in weight, skin, hair change, nocturia, and paresthesia, diabetic polys, visual blurring, hyper / hypo glycemic episodes.  Heme/Lymph: No excessive bleeding, bruising, enlarged lymph nodes.  Physical Exam  BP 124/80   Pulse 75   Temp 97.8 F (36.6 C)  Resp 16   Ht 5' 3.5" (1.613 m)   Wt 176 lb 9.6 oz (80.1 kg)   LMP 03/02/2013   SpO2 98%   BMI 30.79 kg/m   General Appearance: Well nourished, well groomed and in no apparent distress.  Eyes: PERRLA, EOMs, conjunctiva no swelling or erythema, normal fundi and vessels. Sinuses: No frontal/maxillary tenderness ENT/Mouth: EACs patent / TMs  nl. Nares clear without erythema, swelling, mucoid exudates. Oral hygiene is good. No erythema, swelling, or exudate. Tongue normal, non-obstructing. Tonsils not swollen or erythematous. Hearing normal.  Neck: Supple, thyroid not palpable. No bruits, nodes or JVD. Respiratory: Respiratory effort normal.  BS equal and clear bilateral without rales, rhonci, wheezing or stridor. Cardio: Heart sounds are normal with regular rate and rhythm and no murmurs, rubs or gallops. Peripheral pulses are normal and equal bilaterally without edema. No aortic or femoral bruits. Chest: symmetric with normal excursions and percussion. Breasts: Symmetric, without lumps, nipple discharge, retractions, or fibrocystic changes.  Abdomen: Flat, soft with bowel sounds active. Nontender, no guarding, rebound, hernias, masses, or organomegaly.  Lymphatics: Non tender without lymphadenopathy.  Genitourinary:  Musculoskeletal: Full ROM all peripheral extremities, joint stability, 5/5 strength, and normal gait. Skin: Warm and dry without rashes, lesions,  cyanosis, clubbing or  ecchymosis.  Neuro: Cranial nerves intact, reflexes equal bilaterally. Normal muscle tone, no cerebellar symptoms. Sensation intact.  Pysch: Alert and oriented X 3, normal affect, Insight and Judgment appropriate.    Assessment and Plan  1. Annual Preventative Screening Examination   2. Labile hypertension  - EKG 12-Lead - [place], RETROPERITNL ABD,  LTD - Urinalysis, Routine w reflex microscopic - Microalbumin / creatinine urine ratio  3. Hyperlipidemia, mixed  - EKG 12-Lead - [place], RETROPERITNL ABD,  LTD  4. Abnormal glucose  - EKG 12-Lead - [place], RETROPERITNL ABD,  LTD - Hemoglobin A1c - Insulin, random  5. Vitamin D deficiency  - VITAMIN D 25 Hydroxy   6. Hypothyroidism  - TSH  7. Screening for colorectal cancer  - POC Hemoccult Bld/Stl   8. Screening examination for pulmonary tuberculosis  - TB Skin Test  9. Screening for ischemic heart disease  - EKG 12-Lead  10. FH: hypertension  - EKG 12-Lead - [place], RETROPERITNL ABD,  LTD  11. Screening for AAA (aortic abdominal aneurysm)   12. Fatigue, unspecified type  - Iron, Total/Total Iron Binding Cap - Vitamin B12 - CBC with Differential/Platelet - TS   13. Visit for TB skin test   14. Gastroesophageal reflux disease  - pantoprazole  40 MG tablet; Take  1 tablet  2 x /day  Disp: 180 tablet; Refill: 3  15. Medication management  - Urinalysis, Routine w reflex microscopic - Microalbumin / creatinine urine ratio - CBC with Differential/Platelet - COMPLETE METABOLIC PANEL WITH GFR - Magnesium - Lipid panel - TSH - Hemoglobin A1c - Insulin, random - VITAMIN D 25 Hydroxy          Patient was counseled in prudent diet to achieve/maintain BMI less than 25 for weight control, BP monitoring, regular exercise and medications. Discussed med's effects and SE's. Screening labs and tests as requested with regular follow-up as recommended. Over 40 minutes of exam, counseling, chart  review and high complex critical decision making was performed.   Kirtland Bouchard, MD

## 2022-10-31 ENCOUNTER — Ambulatory Visit (INDEPENDENT_AMBULATORY_CARE_PROVIDER_SITE_OTHER): Payer: 59 | Admitting: Internal Medicine

## 2022-10-31 ENCOUNTER — Encounter: Payer: Self-pay | Admitting: Internal Medicine

## 2022-10-31 ENCOUNTER — Ambulatory Visit
Admission: RE | Admit: 2022-10-31 | Discharge: 2022-10-31 | Disposition: A | Discharge status: Home / Self Care | Payer: 59 | Source: Ambulatory Visit | Attending: Internal Medicine | Admitting: Internal Medicine

## 2022-10-31 VITALS — BP 124/80 | HR 75 | Temp 97.8°F | Resp 16 | Ht 63.5 in | Wt 176.6 lb

## 2022-10-31 DIAGNOSIS — G8929 Other chronic pain: Secondary | ICD-10-CM

## 2022-10-31 DIAGNOSIS — Z136 Encounter for screening for cardiovascular disorders: Secondary | ICD-10-CM | POA: Diagnosis not present

## 2022-10-31 DIAGNOSIS — I700 Atherosclerosis of aorta: Secondary | ICD-10-CM | POA: Diagnosis not present

## 2022-10-31 DIAGNOSIS — E559 Vitamin D deficiency, unspecified: Secondary | ICD-10-CM

## 2022-10-31 DIAGNOSIS — Z111 Encounter for screening for respiratory tuberculosis: Secondary | ICD-10-CM | POA: Diagnosis not present

## 2022-10-31 DIAGNOSIS — E782 Mixed hyperlipidemia: Secondary | ICD-10-CM

## 2022-10-31 DIAGNOSIS — R928 Other abnormal and inconclusive findings on diagnostic imaging of breast: Secondary | ICD-10-CM

## 2022-10-31 DIAGNOSIS — Z0000 Encounter for general adult medical examination without abnormal findings: Secondary | ICD-10-CM | POA: Diagnosis not present

## 2022-10-31 DIAGNOSIS — R7309 Other abnormal glucose: Secondary | ICD-10-CM

## 2022-10-31 DIAGNOSIS — K219 Gastro-esophageal reflux disease without esophagitis: Secondary | ICD-10-CM

## 2022-10-31 DIAGNOSIS — E039 Hypothyroidism, unspecified: Secondary | ICD-10-CM

## 2022-10-31 DIAGNOSIS — Z23 Encounter for immunization: Secondary | ICD-10-CM

## 2022-10-31 DIAGNOSIS — R5383 Other fatigue: Secondary | ICD-10-CM

## 2022-10-31 DIAGNOSIS — Z8249 Family history of ischemic heart disease and other diseases of the circulatory system: Secondary | ICD-10-CM

## 2022-10-31 DIAGNOSIS — R0989 Other specified symptoms and signs involving the circulatory and respiratory systems: Secondary | ICD-10-CM | POA: Diagnosis not present

## 2022-10-31 DIAGNOSIS — Z0001 Encounter for general adult medical examination with abnormal findings: Secondary | ICD-10-CM

## 2022-10-31 DIAGNOSIS — Z79899 Other long term (current) drug therapy: Secondary | ICD-10-CM

## 2022-10-31 MED ORDER — GABAPENTIN 600 MG PO TABS: ORAL_TABLET | ORAL | 1 refills | Status: DC

## 2022-10-31 MED ORDER — PANTOPRAZOLE SODIUM 40 MG PO TBEC: DELAYED_RELEASE_TABLET | ORAL | 3 refills | Status: DC

## 2022-10-31 NOTE — Patient Instructions (Signed)
Due to recent changes in healthcare laws, you may see the results of your imaging and laboratory studies on MyChart before your provider has had a chance to review them.  We understand that in some cases there may be results that are confusing or concerning to you. Not all laboratory results come back in the same time frame and the provider may be waiting for multiple results in order to interpret others.  Please give [place] 48 hours in order for your provider to thoroughly review all the results before contacting the office for clarification of your results.   ++++++++++++++++++++++++++++++  Vit D  & Vit C 1,000 mg   are recommended to help protect  against the Covid-19 and other Corona viruses.    Also it's recommended  to take  Zinc 50 mg  to help  protect against the Covid-19   and best place to get  is also on [email or web address]  and don't pay more than 6-8 cents /pill !  ================================ Coronavirus (COVID-19) Are you at risk?  Are you at risk for the Coronavirus (COVID-19)?  To be considered HIGH RISK for Coronavirus (COVID-19), you have to meet the following criteria:  Traveled to [place], [place], [place], [place] or [place]; or in the [place] to [place], [place], [place]  or [place]; and have fever, cough, and shortness of breath within the last 2 weeks of travel OR Been in close contact with a person diagnosed with COVID-19 within the last 2 weeks and have  fever, cough,and shortness of breath  IF YOU DO NOT MEET THESE CRITERIA, YOU ARE CONSIDERED LOW RISK FOR COVID-19.  What to do if you are HIGH RISK for COVID-19?  If you are having a medical emergency, call 911. Seek medical care right away. Before you go to a doctor's office, urgent care or emergency department,  call ahead and tell them about your recent travel, contact with someone diagnosed with COVID-19   and your symptoms.  You should receive instructions from your physician's office regarding  next steps of care.  When you arrive at healthcare provider, tell the healthcare staff immediately you have returned from  visiting [place], [place], [place], [place] or [place]; or traveled in the [place] to [place], [place],  [place] or [place] in the last two weeks or you have been in close contact with a person diagnosed with  COVID-19 in the last 2 weeks.   Tell the health care staff about your symptoms: fever, cough and shortness of breath. After you have been seen by a medical provider, you will be either: Tested for (COVID-19) and discharged home on quarantine except to seek medical care if  symptoms worsen, and asked to  Stay home and avoid contact with others until you get your results (4-5 days)  Avoid travel on public transportation if possible (such as bus, train, or airplane) or Sent to the Emergency Department by EMS for evaluation, COVID-19 testing  and  possible admission depending on your condition and test results.  What to do if you are LOW RISK for COVID-19?  Reduce your risk of any infection by using the same precautions used for avoiding the common cold or flu:  Wash your hands often with soap and warm water for at least 20 seconds.  If soap and water are not readily available,  use an alcohol-based hand sanitizer with at least 60% alcohol.  If coughing or sneezing, cover your mouth and nose by coughing  or sneezing into the elbow areas of your shirt or coat,  into a tissue or into your sleeve (not your hands). Avoid shaking hands with others and consider head nods or verbal greetings only. Avoid touching your eyes, nose, or mouth with unwashed hands.  Avoid close contact with people who are sick. Avoid places or events with large numbers of people in one location, like concerts or sporting events. Carefully consider travel plans you have or are making. If you are planning any travel outside or inside the [place], visit the CDC's Travelers' Health webpage for  the latest health notices. If you have some symptoms but not all symptoms, continue to monitor at home and seek medical attention  if your symptoms worsen. If you are having a medical emergency, call 911. >>>>>>>>>>>>>>>>>>>>>>> Preventive Care for Adults  A healthy lifestyle and preventive care can promote health and wellness. Preventive health guidelines for women include the following key practices. A routine yearly physical is a good way to check with your health care provider about your health and preventive screening. It is a chance to share any concerns and updates on your health and to receive a thorough exam. Visit your dentist for a routine exam and preventive care every 6 months. Brush your teeth twice a day and floss once a day. Good oral hygiene prevents tooth decay and gum disease. The frequency of eye exams is based on your age, health, family medical history, use of contact lenses, and other factors. Follow your health care provider's recommendations for frequency of eye exams. Eat a healthy diet. Foods like vegetables, fruits, whole grains, low-fat dairy products, and lean protein foods contain the nutrients you need without too many calories. Decrease your intake of foods high in solid fats, added sugars, and salt. Eat the right amount of calories for you. Get information about a proper diet from your health care provider, if necessary. Regular physical exercise is one of the most important things you can do for your health. Most adults should get at least 150 minutes of moderate-intensity exercise (any activity that increases your heart rate and causes you to sweat) each week. In addition, most adults need muscle-strengthening exercises on 2 or more days a week. Maintain a healthy weight. The body mass index (BMI) is a screening tool to identify possible weight problems. It provides an estimate of body fat based on height and weight. Your health care provider can find your BMI and can  help you achieve or maintain a healthy weight. For adults 20 years and older: A BMI below 18.5 is considered underweight. A BMI of 18.5 to 24.9 is normal. A BMI of 25 to 29.9 is considered overweight. A BMI of 30 and above is considered obese. Maintain normal blood lipids and cholesterol levels by exercising and minimizing your intake of saturated fat. Eat a balanced diet with plenty of fruit and vegetables. Blood tests for lipids and cholesterol should begin at age 43 and be repeated every 5 years. If your lipid or cholesterol levels are high, you are over 50, or you are at high risk for heart disease, you may need your cholesterol levels checked more frequently. Ongoing high lipid and cholesterol levels should be treated with medicines if diet and exercise are not working. If you smoke, find out from your health care provider how to quit. If you do not use tobacco, do not start. Lung cancer screening is recommended for adults aged 11-80 years who are at high risk for developing  lung cancer because of a history of smoking. A yearly low-dose CT scan of the lungs is recommended for people who have at least a 30-pack-year history of smoking and are a current smoker or have quit within the past 15 years. A pack year of smoking is smoking an average of 1 pack of cigarettes a day for 1 year (for example: 1 pack a day for 30 years or 2 packs a day for 15 years). Yearly screening should continue until the smoker has stopped smoking for at least 15 years. Yearly screening should be stopped for people who develop a health problem that would prevent them from having lung cancer treatment. High blood pressure causes heart disease and increases the risk of stroke. Your blood pressure should be checked at least every 1 to 2 years. Ongoing high blood pressure should be treated with medicines if weight loss and exercise do not work. If you are 32-3 years old, ask your health care provider if you should take aspirin to  prevent strokes. Diabetes screening involves taking a blood sample to check your fasting blood sugar level. This should be done once every 3 years, after age 32, if you are within normal weight and without risk factors for diabetes. Testing should be considered at a younger age or be carried out more frequently if you are overweight and have at least 1 risk factor for diabetes. Breast cancer screening is essential preventive care for women. You should practice "breast self-awareness." This means understanding the normal appearance and feel of your breasts and may include breast self-examination. Any changes detected, no matter how small, should be reported to a health care provider. Women in their 45s and 30s should have a clinical breast exam (CBE) by a health care provider as part of a regular health exam every 1 to 3 years. After age 83, women should have a CBE every year. Starting at age 92, women should consider having a mammogram (breast X-ray test) every year. Women who have a family history of breast cancer should talk to their health care provider about genetic screening. Women at a high risk of breast cancer should talk to their health care providers about having an MRI and a mammogram every year. Breast cancer gene (BRCA)-related cancer risk assessment is recommended for women who have family members with BRCA-related cancers. BRCA-related cancers include breast, ovarian, tubal, and peritoneal cancers. Having family members with these cancers may be associated with an increased risk for harmful changes (mutations) in the breast cancer genes BRCA1 and BRCA2. Results of the assessment will determine the need for genetic counseling and BRCA1 and BRCA2 testing. Routine pelvic exams to screen for cancer are no longer recommended for nonpregnant women who are considered low risk for cancer of the pelvic organs (ovaries, uterus, and vagina) and who do not have symptoms. Ask your health care provider if a  screening pelvic exam is right for you. If you have had past treatment for cervical cancer or a condition that could lead to cancer, you need Pap tests and screening for cancer for at least 20 years after your treatment. If Pap tests have been discontinued, your risk factors (such as having a new sexual partner) need to be reassessed to determine if screening should be resumed. Some women have medical problems that increase the chance of getting cervical cancer. In these cases, your health care provider may recommend more frequent screening and Pap tests. Colorectal cancer can be detected and often prevented. Most routine colorectal  cancer screening begins at the age of 10 years and continues through age 19 years. However, your health care provider may recommend screening at an earlier age if you have risk factors for colon cancer. On a yearly basis, your health care provider may provide home test kits to check for hidden blood in the stool. Use of a small camera at the end of a tube, to directly examine the colon (sigmoidoscopy or colonoscopy), can detect the earliest forms of colorectal cancer. Talk to your health care provider about this at age 28, when routine screening begins.  Direct exam of the colon should be repeated every 5-10 years through age 71 years, unless early forms of pre-cancerous polyps or small growths are found. Hepatitis C blood testing is recommended for all people born from [birth year] through 1965 and any individual with known risks for hepatitis C.  Osteoporosis is a disease in which the bones lose minerals and strength with aging. This can result in serious bone fractures or breaks. The risk of osteoporosis can be identified using a bone density scan. Women ages 21 years and over and women at risk for fractures or osteoporosis should discuss screening with their health care providers. Ask your health care provider whether you should take a calcium supplement or vitamin D to reduce the rate  of osteoporosis. Menopause can be associated with physical symptoms and risks. Hormone replacement therapy is available to decrease symptoms and risks. You should talk to your health care provider about whether hormone replacement therapy is right for you. Use sunscreen. Apply sunscreen liberally and repeatedly throughout the day. You should seek shade when your shadow is shorter than you. Protect yourself by wearing long sleeves, pants, a wide-brimmed hat, and sunglasses year round, whenever you are outdoors. Once a month, do a whole body skin exam, using a mirror to look at the skin on your back. Tell your health care provider of new moles, moles that have irregular borders, moles that are larger than a pencil eraser, or moles that have changed in shape or color. Stay current with required vaccines (immunizations). Influenza vaccine. All adults should be immunized every year. Tetanus, diphtheria, and acellular pertussis (Td, Tdap) vaccine. Pregnant women should receive 1 dose of Tdap vaccine during each pregnancy. The dose should be obtained regardless of the length of time since the last dose. Immunization is preferred during the 27th-36th week of gestation. An adult who has not previously received Tdap or who does not know her vaccine status should receive 1 dose of Tdap. This initial dose should be followed by tetanus and diphtheria toxoids (Td) booster doses every 10 years. Adults with an unknown or incomplete history of completing a 3-dose immunization series with Td-containing vaccines should begin or complete a primary immunization series including a Tdap dose. Adults should receive a Td booster every 10 years. Varicella vaccine. An adult without evidence of immunity to varicella should receive 2 doses or a second dose if she has previously received 1 dose. Pregnant females who do not have evidence of immunity should receive the first dose after pregnancy. This first dose should be obtained before  leaving the health care facility. The second dose should be obtained 4-8 weeks after the first dose. Human papillomavirus (HPV) vaccine. Females aged 13-26 years who have not received the vaccine previously should obtain the 3-dose series. The vaccine is not recommended for use in pregnant females. However, pregnancy testing is not needed before receiving a dose. If a female is found to  be pregnant after receiving a dose, no treatment is needed. In that case, the remaining doses should be delayed until after the pregnancy. Immunization is recommended for any person with an immunocompromised condition through the age of 55 years if she did not get any or all doses earlier. During the 3-dose series, the second dose should be obtained 4-8 weeks after the first dose. The third dose should be obtained 24 weeks after the first dose and 16 weeks after the second dose. Zoster vaccine. One dose is recommended for adults aged 15 years or older unless certain conditions are present. Measles, mumps, and rubella (MMR) vaccine. Adults born before 20 generally are considered immune to measles and mumps. Adults born in [birth year] or later should have 1 or more doses of MMR vaccine unless there is a contraindication to the vaccine or there is laboratory evidence of immunity to each of the three diseases. A routine second dose of MMR vaccine should be obtained at least 28 days after the first dose for students attending postsecondary schools, health care workers, or international travelers. People who received inactivated measles vaccine or an unknown type of measles vaccine during 1963-1967 should receive 2 doses of MMR vaccine. People who received inactivated mumps vaccine or an unknown type of mumps vaccine before 1979 and are at high risk for mumps infection should consider immunization with 2 doses of MMR vaccine. For females of childbearing age, rubella immunity should be determined. If there is no evidence of immunity, females  who are not pregnant should be vaccinated. If there is no evidence of immunity, females who are pregnant should delay immunization until after pregnancy. Unvaccinated health care workers born before [birth year] who lack laboratory evidence of measles, mumps, or rubella immunity or laboratory confirmation of disease should consider measles and mumps immunization with 2 doses of MMR vaccine or rubella immunization with 1 dose of MMR vaccine. Pneumococcal 13-valent conjugate (PCV13) vaccine. When indicated, a person who is uncertain of her immunization history and has no record of immunization should receive the PCV13 vaccine. An adult aged 4 years or older who has certain medical conditions and has not been previously immunized should receive 1 dose of PCV13 vaccine. This PCV13 should be followed with a dose of pneumococcal polysaccharide (PPSV23) vaccine. The PPSV23 vaccine dose should be obtained at least 1 or more year(s) after the dose of PCV13 vaccine. An adult aged 15 years or older who has certain medical conditions and previously received 1 or more doses of PPSV23 vaccine should receive 1 dose of PCV13. The PCV13 vaccine dose should be obtained 1 or more years after the last PPSV23 vaccine dose.  Pneumococcal polysaccharide (PPSV23) vaccine. When PCV13 is also indicated, PCV13 should be obtained first. All adults aged 9 years and older should be immunized. An adult younger than age 14 years who has certain medical conditions should be immunized. Any person who resides in a nursing home or long-term care facility should be immunized. An adult smoker should be immunized. People with an immunocompromised condition and certain other conditions should receive both PCV13 and PPSV23 vaccines. People with human immunodeficiency virus (HIV) infection should be immunized as soon as possible after diagnosis. Immunization during chemotherapy or radiation therapy should be avoided. Routine use of PPSV23 vaccine is not  recommended for American Indians, [place] Natives, or people younger than 65 years unless there are medical conditions that require PPSV23 vaccine. When indicated, people who have unknown immunization and have no record of immunization should receive  PPSV23 vaccine. One-time revaccination 5 years after the first dose of PPSV23 is recommended for people aged 19-64 years who have chronic kidney failure, nephrotic syndrome, asplenia, or immunocompromised conditions. People who received 1-2 doses of PPSV23 before age 36 years should receive another dose of PPSV23 vaccine at age 52 years or later if at least 5 years have passed since the previous dose. Doses of PPSV23 are not needed for people immunized with PPSV23 at or after age 34 years.  Preventive Services / Frequency  Ages 74 to 1 years Blood pressure check. Lipid and cholesterol check. Lung cancer screening. / Every year if you are aged 53-80 years and have a 30-pack-year history of smoking and currently smoke or have quit within the past 15 years. Yearly screening is stopped once you have quit smoking for at least 15 years or develop a health problem that would prevent you from having lung cancer treatment. Clinical breast exam.** / Every year after age 16 years.  BRCA-related cancer risk assessment.** / For women who have family members with a BRCA-related cancer (breast, ovarian, tubal, or peritoneal cancers). Mammogram.** / Every year beginning at age 26 years and continuing for as long as you are in good health. Consult with your health care provider. Pap test.** / Every 3 years starting at age 19 years through age 52 or 29 years with a history of 3 consecutive normal Pap tests. HPV screening.** / Every 3 years from ages 18 years through ages 4 to 110 years with a history of 3 consecutive normal Pap tests. Fecal occult blood test (FOBT) of stool. / Every year beginning at age 70 years and continuing until age 53 years. You may not need to do this  test if you get a colonoscopy every 10 years. Flexible sigmoidoscopy or colonoscopy.** / Every 5 years for a flexible sigmoidoscopy or every 10 years for a colonoscopy beginning at age 66 years and continuing until age 44 years. Hepatitis C blood test.** / For all people born from [birth year] through 1964 and any individual with known risks for hepatitis C. Skin self-exam. / Monthly. Influenza vaccine. / Every year. Tetanus, diphtheria, and acellular pertussis (Tdap/Td) vaccine.** / Consult your health care provider. Pregnant women should receive 1 dose of Tdap vaccine during each pregnancy. 1 dose of Td every 10 years. Varicella vaccine.** / Consult your health care provider. Pregnant females who do not have evidence of immunity should receive the first dose after pregnancy. Zoster vaccine.** / 1 dose for adults aged 90 years or older. Pneumococcal 13-valent conjugate (PCV13) vaccine.** / Consult your health care provider. Pneumococcal polysaccharide (PPSV23) vaccine.** / 1 to 2 doses if you smoke cigarettes or if you have certain conditions. Meningococcal vaccine.** / Consult your health care provider. Hepatitis A vaccine.** / Consult your health care provider. Hepatitis B vaccine.** / Consult your health care provider. Screening for abdominal aortic aneurysm (AAA)  by ultrasound is recommended for people over 50 who have history of high blood pressure or who are current or former smokers. ++++++++++++++++++ Recommend Adult Low Dose Aspirin or  coated  Aspirin 81 mg daily  To reduce risk of Colon Cancer 40 %,  Skin Cancer 26 % ,  Melanoma 46%  and  Pancreatic cancer 60% +++++++++++++++++++ Vitamin D goal  is between 70-100.  Please make sure that you are taking your Vitamin D as directed.  It is very important as a natural anti-inflammatory  helping hair, skin, and nails, as well as reducing stroke and heart  attack risk.  It helps your bones and helps with mood. It also decreases numerous  cancer risks so please take it as directed.  Low Vit D is associated with a 200-300% higher risk for CANCER  and 200-300% higher risk for HEART   ATTACK  &  STROKE.   .....................................Marland Kitchen It is also associated with higher death rate at younger ages,  autoimmune diseases like Rheumatoid arthritis, Lupus, Multiple Sclerosis.    Also many other serious conditions, like depression, Alzheimer's Dementia, infertility, muscle aches, fatigue, fibromyalgia - just to name a few. ++++++++++++++++++ Recommend the book "The END of DIETING" by Dr Excell Seltzer  & the book "The END of DIABETES " by Dr Excell Seltzer At [email or web address] - get book & Audio CD's    Being diabetic has a  300% increased risk for heart attack, stroke, cancer, and alzheimer- type vascular dementia. It is very important that you work harder with diet by avoiding all foods that are white. Avoid white rice (brown & wild rice is OK), white potatoes (sweetpotatoes in moderation is OK), White bread or wheat bread or anything made out of white flour like bagels, donuts, rolls, buns, biscuits, cakes, pastries, cookies, pizza crust, and pasta (made from white flour & egg whites) - vegetarian pasta or spinach or wheat pasta is OK. Multigrain breads like Arnold's or [employer], or multigrain sandwich thins or flatbreads.  Diet, exercise and weight loss can reverse and cure diabetes in the early stages.  Diet, exercise and weight loss is very important in the control and prevention of complications of diabetes which affects every system in your body, ie. Brain - dementia/stroke, eyes - glaucoma/blindness, heart - heart attack/heart failure, kidneys - dialysis, stomach - gastric paralysis, intestines - malabsorption, nerves - severe painful neuritis, circulation - gangrene & loss of a leg(s), and finally cancer and Alzheimers.    I recommend avoid fried & greasy foods,  sweets/candy, white rice (brown or wild rice or Quinoa is OK), white  potatoes (sweet potatoes are OK) - anything made from white flour - bagels, doughnuts, rolls, buns, biscuits,white and wheat breads, pizza crust and traditional pasta made of white flour & egg white(vegetarian pasta or spinach or wheat pasta is OK).  Multi-grain bread is OK - like multi-grain flat bread or sandwich thins. Avoid alcohol in excess. Exercise is also important.    Eat all the vegetables you want - avoid meat, especially red meat and dairy - especially cheese.  Cheese is the most concentrated form of trans-fats which is the worst thing to clog up our arteries. Veggie cheese is OK which can be found in the fresh produce section at [employer] or [employer] or [employer]  ++++++++++++++++++++++ DASH Eating Plan  DASH stands for "Dietary Approaches to Stop Hypertension."   The DASH eating plan is a healthy eating plan that has been shown to reduce high blood pressure (hypertension). Additional health benefits may include reducing the risk of type 2 diabetes mellitus, heart disease, and stroke. The DASH eating plan may also help with weight loss. WHAT DO I NEED TO KNOW ABOUT THE DASH EATING PLAN? For the DASH eating plan, you will follow these general guidelines: Choose foods with a percent daily value for sodium of less than 5% (as listed on the food label). Use salt-free seasonings or herbs instead of table salt or sea salt. Check with your health care provider or pharmacist before using salt substitutes. Eat lower-sodium products, often labeled as "lower sodium" or "no  salt added." Eat fresh foods. Eat more vegetables, fruits, and low-fat dairy products. Choose whole grains. Look for the word "whole" as the first word in the ingredient list. Choose fish  Limit sweets, desserts, sugars, and sugary drinks. Choose heart-healthy fats. Eat veggie cheese  Eat more home-cooked food and less restaurant, buffet, and fast food. Limit fried foods. Cook foods using methods other than  frying. Limit canned vegetables. If you do use them, rinse them well to decrease the sodium. When eating at a restaurant, ask that your food be prepared with less salt, or no salt if possible.                      WHAT FOODS CAN I EAT? Read Dr Fara Olden Fuhrman's books on The End of Dieting & The End of Diabetes  Grains Whole grain or whole wheat bread. Brown rice. Whole grain or whole wheat pasta. Quinoa, bulgur, and whole grain cereals. Low-sodium cereals. Corn or whole wheat flour tortillas. Whole grain cornbread. Whole grain crackers. Low-sodium crackers.  Vegetables Fresh or frozen vegetables (raw, steamed, roasted, or grilled). Low-sodium or reduced-sodium tomato and vegetable juices. Low-sodium or reduced-sodium tomato sauce and paste. Low-sodium or reduced-sodium canned vegetables.   Fruits All fresh, canned (in natural juice), or frozen fruits.  Protein Products  All fish and seafood.  Dried beans, peas, or lentils. Unsalted nuts and seeds. Unsalted canned beans.  Dairy Low-fat dairy products, such as skim or 1% milk, 2% or reduced-fat cheeses, low-fat ricotta or cottage cheese, or plain low-fat yogurt. Low-sodium or reduced-sodium cheeses.  Fats and Oils Tub margarines without trans fats. Light or reduced-fat mayonnaise and salad dressings (reduced sodium). Avocado. Safflower, olive, or canola oils. Natural peanut or almond butter.  Other Unsalted popcorn and pretzels. The items listed above may not be a complete list of recommended foods or beverages. Contact your dietitian for more options.  ++++++++++++++++++  WHAT FOODS ARE NOT RECOMMENDED? Grains/ White flour or wheat flour White bread. White pasta. White rice. Refined cornbread. Bagels and croissants. Crackers that contain trans fat.  Vegetables  Creamed or fried vegetables. Vegetables in a . Regular canned vegetables. Regular canned tomato sauce and paste. Regular tomato and vegetable juices.  Fruits Dried fruits.  Canned fruit in light or heavy syrup. Fruit juice.  Meat and Other Protein Products Meat in general - RED meat & White meat.  Fatty cuts of meat. Ribs, chicken wings, all processed meats as bacon, sausage, bologna, salami, fatback, hot dogs, bratwurst and packaged luncheon meats.  Dairy Whole or 2% milk, cream, half-and-half, and cream cheese. Whole-fat or sweetened yogurt. Full-fat cheeses or blue cheese. Non-dairy creamers and whipped toppings. Processed cheese, cheese spreads, or cheese curds.  Condiments Onion and garlic salt, seasoned salt, table salt, and sea salt. Canned and packaged gravies. Worcestershire sauce. Tartar sauce. Barbecue sauce. Teriyaki sauce. Soy sauce, including reduced sodium. Steak sauce. Fish sauce. Oyster sauce. Cocktail sauce. Horseradish. Ketchup and mustard. Meat flavorings and tenderizers. Bouillon cubes. Hot sauce. Tabasco sauce. Marinades. Taco seasonings. Relishes.  Fats and Oils Butter, stick margarine, lard, shortening and bacon fat. Coconut, palm kernel, or palm oils. Regular salad dressings.  Pickles and olives. Salted popcorn and pretzels.  The items listed above may not be a complete list of foods and beverages to avoid.

## 2022-11-01 ENCOUNTER — Other Ambulatory Visit: Payer: Self-pay | Admitting: Internal Medicine

## 2022-11-01 ENCOUNTER — Encounter: Payer: Self-pay | Admitting: Internal Medicine

## 2022-11-01 DIAGNOSIS — E782 Mixed hyperlipidemia: Secondary | ICD-10-CM

## 2022-11-01 MED ORDER — ROSUVASTATIN CALCIUM 20 MG PO TABS: ORAL_TABLET | ORAL | 3 refills | Status: DC

## 2022-11-01 NOTE — Progress Notes (Signed)
<><><><><><><><><><><><><><><><><><><><><><><><><><><><><><><><><> <><><><><><><><><><><><><><><><><><><><><><><><><><><><><><><><><> - Test results slightly outside the reference range are not unusual. If there is anything important, I will review this with you,  otherwise it is considered normal test values.  If you have further questions,  please do not hesitate to contact me at the office or via My Chart.  <><><><><><><><><><><><><><><><><><><><><><><><><><><><><><><><><> <><><><><><><><><><><><><><><><><><><><><><><><><><><><><><><><><>  -  A1c = 5.8% Bis again  elevated in the borderline and early or pre-diabetes range   which has the same   300% increased risk for   heart attack, stroke, cancer and  alzheimer- type vascular dementia                                                                                                  as full blown diabetes.   But the good news is that diet, exercise with weight loss can                                                                              cure the early diabetes at this point.  <><><><><><><><><><><><><><><><><><><><><><><><><><><><><><><><><> <><><><><><><><><><><><><><><><><><><><><><><><><><><><><><><><><>  -  Iron & Vitamin B12 levels are both Normal   <><><><><><><><><><><><><><><><><><><><><><><><><><><><><><><><><> <><><><><><><><><><><><><><><><><><><><><><><><><><><><><><><><><>  -  Vitamin D = 42 is VERY VERY low  !   - Vitamin D goal is between 70-100.  =-=-=-=-=-=-=-=-=-=-=-=-=-=-=-=-=-=-=-=-=-=-=-=-=-=-=-=-=-=-=-=-=-=-=-=-=-=-=-=-=-=-  - Please INCREASE  your Vitamin D 5,000 units to                                                                     2 capsules = 10,000 units /day   =-=-=-=-=-=-=-=-=-=-=-=-=-=-=-=-=-=-=-=-=-=-=-=-=-=-=-=-=-=-=-=-=-=-=-=-=-=-=-=-=-=-  - It is very important as a natural anti-inflammatory and helping the                             immune system protect against viral  infections, like the Covid-19    helping hair, skin, and nails, as well as reducing stroke and heart attack risk.   - It helps your bones and helps with mood.  - It also decreases numerous cancer risks so please  take it as directed.   - Low Vit D is associated with a 200-300% higher risk for CANCER   and 200-300% higher risk for HEART   ATTACK  &  STROKE.    - It is also associated with higher death rate at younger ages,   autoimmune diseases like Rheumatoid arthritis, Lupus, Multiple Sclerosis.     - Also many other serious conditions, like depression,  Alzheimer's Dementia,  muscle aches, fatigue, fibromyalgia   <><><><><><><><><><><><><><><><><><><><><><><><><><><><><><><><><> <><><><><><><><><><><><><><><><><><><><><><><><><><><><><><><><><>   -  Total  Chol =    158   - Excellent            (  Ideal  or  Goal is less than 180  !  )  & -  Bad / Dangerous LDL  Chol = 73   - also Excellent             (  Ideal  or  Goal is less than 70  !  )   - Continue  Meds same  <><><><><><><><><><><><><><><><><><><><><><><><><><><><><><><><><> <><><><><><><><><><><><><><><><><><><><><><><><><><><><><><><><><>  - All Else - CBC - Kidneys - Electrolytes - Liver - Magnesium & Thyroid    - all  Normal / OK <><><><><><><><><><><><><><><><><><><><><><><><><><><><><><><><><> <><><><><><><><><><><><><><><><><><><><><><><><><><><><><><><><><>

## 2022-11-03 LAB — LIPID PANEL
Cholesterol: 158 mg/dL (ref <200)
HDL: 71 mg/dL (ref >=50)
LDL Cholesterol (Calc): 73 mg/dL (calc)
Non-HDL Cholesterol (Calc): 87 mg/dL (calc) (ref <130)
Total CHOL/HDL Ratio: 2.2 (calc) (ref <5.0)
Triglycerides: 67 mg/dL (ref <150)

## 2022-11-03 LAB — CBC WITH DIFFERENTIAL/PLATELET
Absolute Monocytes: 480 cells/uL (ref 200–950)
Basophils Absolute: 40 cells/uL (ref 0–200)
Basophils Relative: 0.9 %
Eosinophils Absolute: 198 cells/uL (ref 15–500)
Eosinophils Relative: 4.5 %
HCT: 42.1 % (ref 35.0–45.0)
Hemoglobin: 13.7 g/dL (ref 11.7–15.5)
Lymphs Abs: 1087 cells/uL (ref 850–3900)
MCH: 28.8 pg (ref 27.0–33.0)
MCHC: 32.5 g/dL (ref 32.0–36.0)
MCV: 88.6 fL (ref 80.0–100.0)
MPV: 10.1 fL (ref 7.5–12.5)
Monocytes Relative: 10.9 %
Neutro Abs: 2596 cells/uL (ref 1500–7800)
Neutrophils Relative %: 59 %
Platelets: 250 10*3/uL (ref 140–400)
RBC: 4.75 10*6/uL (ref 3.80–5.10)
RDW: 12.8 % (ref 11.0–15.0)
Total Lymphocyte: 24.7 %
WBC: 4.4 10*3/uL (ref 3.8–10.8)

## 2022-11-03 LAB — URINALYSIS, ROUTINE W REFLEX MICROSCOPIC
Bilirubin Urine: NEGATIVE
Glucose, UA: NEGATIVE
Hgb urine dipstick: NEGATIVE
Ketones, ur: NEGATIVE
Leukocytes,Ua: NEGATIVE
Nitrite: NEGATIVE
Protein, ur: NEGATIVE
Specific Gravity, Urine: 1.006 (ref 1.001–1.035)
pH: 7 (ref 5.0–8.0)

## 2022-11-03 LAB — COMPLETE METABOLIC PANEL WITH GFR
AG Ratio: 1.4 (calc) (ref 1.0–2.5)
ALT: 11 U/L (ref 6–29)
AST: 18 U/L (ref 10–35)
Albumin: 4.3 g/dL (ref 3.6–5.1)
Alkaline phosphatase (APISO): 55 U/L (ref 37–153)
BUN: 15 mg/dL (ref 7–25)
CO2: 32 mmol/L (ref 20–32)
Calcium: 10 mg/dL (ref 8.6–10.4)
Chloride: 105 mmol/L (ref 98–110)
Creat: 0.81 mg/dL (ref 0.50–1.05)
Globulin: 3 g/dL (calc) (ref 1.9–3.7)
Glucose, Bld: 86 mg/dL (ref 65–99)
Potassium: 4.8 mmol/L (ref 3.5–5.3)
Sodium: 143 mmol/L (ref 135–146)
Total Bilirubin: 0.4 mg/dL (ref 0.2–1.2)
Total Protein: 7.3 g/dL (ref 6.1–8.1)
eGFR: 83 mL/min/{1.73_m2} (ref >=60)

## 2022-11-03 LAB — MAGNESIUM: Magnesium: 2.2 mg/dL (ref 1.5–2.5)

## 2022-11-03 LAB — IRON, TOTAL/TOTAL IRON BINDING CAP
%SAT: 19 % (calc) (ref 16–45)
Iron: 58 ug/dL (ref 45–160)
TIBC: 312 mcg/dL (calc) (ref 250–450)

## 2022-11-03 LAB — MICROALBUMIN / CREATININE URINE RATIO
Creatinine, Urine: 22 mg/dL (ref 20–275)
Microalb, Ur: <0.2 mg/dL

## 2022-11-03 LAB — VITAMIN D 25 HYDROXY (VIT D DEFICIENCY, FRACTURES): Vit D, 25-Hydroxy: 42 ng/mL (ref 30–100)

## 2022-11-03 LAB — VITAMIN B12: Vitamin B-12: 699 pg/mL (ref 200–1100)

## 2022-11-03 LAB — HEMOGLOBIN A1C
Hgb A1c MFr Bld: 5.8 % of total Hgb — ABNORMAL HIGH (ref <5.7)
Mean Plasma Glucose: 120 mg/dL
eAG (mmol/L): 6.6 mmol/L

## 2022-11-03 LAB — INSULIN, RANDOM: Insulin: 7.8 u[IU]/mL

## 2022-11-03 LAB — TSH: TSH: 1.8 mIU/L (ref 0.40–4.50)

## 2022-11-04 ENCOUNTER — Encounter: Payer: Self-pay | Admitting: Internal Medicine

## 2022-11-10 ENCOUNTER — Other Ambulatory Visit: Payer: Self-pay | Admitting: Internal Medicine

## 2022-11-10 DIAGNOSIS — E6609 Other obesity due to excess calories: Secondary | ICD-10-CM

## 2022-11-10 MED ORDER — PHENTERMINE HCL 37.5 MG PO TABS: ORAL_TABLET | ORAL | 1 refills | Status: DC

## 2022-11-10 MED ORDER — TOPIRAMATE 50 MG PO TABS: ORAL_TABLET | ORAL | 1 refills | Status: DC

## 2022-11-20 ENCOUNTER — Ambulatory Visit: Payer: 59 | Admitting: Gastroenterology

## 2022-11-20 ENCOUNTER — Encounter: Payer: Self-pay | Admitting: Gastroenterology

## 2022-11-20 VITALS — BP 102/78 | HR 89 | Ht 64.0 in | Wt 174.4 lb

## 2022-11-20 DIAGNOSIS — K219 Gastro-esophageal reflux disease without esophagitis: Secondary | ICD-10-CM | POA: Diagnosis not present

## 2022-11-20 NOTE — Patient Instructions (Addendum)
_______________________________________________________  If you are age 60 or older, your body mass index should be between 23-30. Your Body mass index is 29.93 kg/m. If this is out of the aforementioned range listed, please consider follow up with your Primary Care Provider.  If you are age 57 or younger, your body mass index should be between 19-25. Your Body mass index is 29.93 kg/m. If this is out of the aformentioned range listed, please consider follow up with your Primary Care Provider.   ________________________________________________________  The [place] providers would like to encourage you to use [facility] to communicate with providers for non-urgent requests or questions.  Due to long hold times on the telephone, sending your provider a message by [facility] may be a faster and more efficient way to get a response.  Please allow 48 business hours for a response.  Please remember that this is for non-urgent requests.  _______________________________________________________  Beth Miranda have been scheduled for an endoscopy. Please follow written instructions given to you at your visit today. If you use inhalers (even only as needed), please bring them with you on the day of your procedure.  Hold Phentermine 10 days prior to the procedure Hold Iron 1 week prior to the procedure  Continue Protonix 2 times a day.  Thank you,  Dr. Jackquline Denmark

## 2022-11-20 NOTE — Progress Notes (Signed)
Chief Complaint: For GERD  Referring Provider:  Unk Pinto, MD      ASSESSMENT AND PLAN;   #1.  Refractory GERD with nocturnal symptoms.   Plan: -Continue protonix 40 BID -EGD for further evaluation. -If still with problems, add pepcid '20mg'$  po QHS -If still with problems, [place] abdo to r/o GB pathology. -Nonpharmacologic means of reflux control.  Proceed with EGD. I have discussed the risks and benefits. The risks including rare risk of perforation, bleeding, missed UGI neoplasms, risks of anesthesia/sedation. Alternatives were given. Patient is aware and agrees to proceed. All the questions were answered. This will be scheduled in upcoming days. Consent forms were given for review. HPI:    Beth Miranda is a 61 y.o. female  With Mixed Connective disease/Raynaud's, HTN, hypothyroidism  C/O GERD x yrs -mostly heartburn and regurgitation Somewhat better with protonix 40 BID, takes TUMS prn Has been sleeping on a recliner as she would have significant nocturnal symptoms.  She is definitely somewhat better since Protonix has been increased to twice daily.  No odynophagia or dysphagia.  No fever chills or night sweats.  No melena or hematochezia.  No significant abdominal pain.  Never had EGD.  No sodas, chocolates, chewing gums, artificial sweeteners and candy. No NSAIDs  No weight gain or weight loss.  She denies having any diarrhea or constipation. Wt Readings from Last 3 Encounters:  11/20/22 174 lb 6 oz (79.1 kg)  10/31/22 176 lb 9.6 oz (80.1 kg)  06/06/22 177 lb (80.3 kg)   Previous GI workup:  Colon 06/06/2022 - Mild predominantly sigmoid diverticulosis - Non-bleeding internal hemorrhoids. - The examined portion of the ileum was normal. - The examination was otherwise normal on direct and retroflexion views. - Rpt in 10 yrs. Earlier, if with any new problems or change in family history.  Past Medical History:  Diagnosis Date   Allergy    seasonal   GERD  (gastroesophageal reflux disease)    Hyperlipidemia    Labile hypertension    Lyme disease    in the 1990/s   Prediabetes    Raynaud's disease    Thyroid disease    hypothyroid   Vitamin D deficiency     Past Surgical History:  Procedure Laterality Date   COLONOSCOPY     FOOT SURGERY  09/28/2020   TENDON REPAIR    TONSILLECTOMY      Family History  Problem Relation Age of Onset   Arthritis Mother        Living   COPD Mother    Asthma Mother    Hypertension Father        Living   Diabetes Father    Kidney disease Father        Stones   Hyperlipidemia Father    Other Brother        Deceased   Pancreatic cancer Paternal Grandmother    Breast cancer Neg Hx    Colon cancer Neg Hx    Esophageal cancer Neg Hx    Rectal cancer Neg Hx    Stomach cancer Neg Hx     Social History   Tobacco Use   Smoking status: Never   Smokeless tobacco: Never  Vaping Use   Vaping Use: Never used  Substance Use Topics   Alcohol use: Yes    Comment: occasionally   Drug use: No    Current Outpatient Medications  Medication Sig Dispense Refill   aspirin 81 MG tablet Take 81 mg  by mouth daily.     betamethasone dipropionate 0.05 % cream Apply 1 application topically 3 (three) times daily.     Cetirizine HCl (ZYRTEC ALLERGY PO) Take 10 mg by mouth at bedtime.     CINNAMON PO Take by mouth.     Cyanocobalamin (VITAMIN B 12 PO) Take 1,000 mcg by mouth daily.     cyclobenzaprine (FLEXERIL) 5 MG tablet Take 1 tablet (5 mg total) by mouth 3 (three) times daily as needed for muscle spasms. 60 tablet 0   escitalopram (LEXAPRO) 20 MG tablet Take 1 tablet  Daily  for Mood                                                            /                                  TAKE                                     BY                                     MOUTH                                 ONCE DAILY 90 tablet 3   Ferrous Sulfate (IRON PO) Take by mouth 3 (three) times a week.     gabapentin (NEURONTIN)  600 MG tablet Take  1/2 to 1 tablet  2 to 3 x  / Daily as needed for Pain 270 tablet 1   levothyroxine (SYNTHROID) 50 MCG tablet TAKE ONE TABLET BY MOUTH ONCE DAILY ON AN EMPTY STOMACH.NO ANTACID MEDS, CALCIUM, OR MAGNESIUM FOR 4 HOURS. AVOID BIOTIN. 90 tablet 3   Magnesium 400 MG TABS Take 1 tablet by mouth 2 (two) times daily.     Melatonin 10 MG TABS Take by mouth.     Omega-3 Fatty Acids (OMEGA-3 FISH OIL) 1200 MG CAPS Take 2 capsules by mouth daily.     pantoprazole (PROTONIX) 40 MG tablet Take  1 tablet  2 x /day (every 12 hours) for Heartburn & Acid Reflux 180 tablet 3   phentermine (ADIPEX-P) 37.5 MG tablet Take 1/2 to 1 tablet every Morning for Dieting & Weight Loss 90 tablet 1   rosuvastatin (CRESTOR) 20 MG tablet Take 1 tablet Daily for Cholesterol 90 tablet 3   topiramate (TOPAMAX) 50 MG tablet Take 1/2 to 1 tablet 2 x /day at Suppertime & Bedtime for Dieting & Weight Loss 180 tablet 1   VITAMIN D PO Take 5,000 Int'l Units by mouth.     No current facility-administered medications for this visit.    Allergies  Allergen Reactions   Penicillins Hives   Sulfa Antibiotics Hives    Review of Systems:  neg     Physical Exam:    BP 102/78   Pulse 89   Ht '5\' 4"'$  (1.626 m)   Wt 174 lb 6 oz (79.1 kg)  LMP 03/02/2013   BMI 29.93 kg/m  Wt Readings from Last 3 Encounters:  11/20/22 174 lb 6 oz (79.1 kg)  10/31/22 176 lb 9.6 oz (80.1 kg)  06/06/22 177 lb (80.3 kg)   Constitutional:  Well-developed, in no acute distress. Psychiatric: Normal mood and affect. Behavior is normal. HEENT: Pupils normal.  Conjunctivae are normal. No scleral icterus. Cardiovascular: Normal rate, regular rhythm. No edema Pulmonary/chest: Effort normal and breath sounds normal. No wheezing, rales or rhonchi. Abdominal: Soft, nondistended. Nontender. Bowel sounds active throughout. There are no masses palpable. No hepatomegaly. Rectal: Deferred Neurological: Alert and oriented to person place and  time. Skin: Skin is warm and dry. No rashes noted.  Data Reviewed: I have personally reviewed following labs and imaging studies  CBC:    Latest Ref Rng & Units 10/31/2022   12:00 AM 12/19/2021    4:17 PM 09/18/2021   10:53 AM  CBC  WBC 3.8 - 10.8 Thousand/uL 4.4  5.3  6.0   Hemoglobin 11.7 - 15.5 g/dL 13.7  13.6  13.8   Hematocrit 35.0 - 45.0 % 42.1  41.8  42.5   Platelets 140 - 400 Thousand/uL 250  248  206     CMP:    Latest Ref Rng & Units 10/31/2022   12:00 AM 12/19/2021    4:17 PM 09/18/2021   10:53 AM  CMP  Glucose 65 - 99 mg/dL 86  88  87   BUN 7 - 25 mg/dL '15  11  19   '$ Creatinine 0.50 - 1.05 mg/dL 0.81  0.90  1.06   Sodium 135 - 146 mmol/L 143  141  143   Potassium 3.5 - 5.3 mmol/L 4.8  4.7  5.0   Chloride 98 - 110 mmol/L 105  104  105   CO2 20 - 32 mmol/L 32  33  31   Calcium 8.6 - 10.4 mg/dL 10.0  9.6  9.9   Total Protein 6.1 - 8.1 g/dL 7.3  7.4  7.3   Total Bilirubin 0.2 - 1.2 mg/dL 0.4  0.4  0.4   AST 10 - 35 U/L '18  20  20   '$ ALT 6 - 29 U/L '11  12  15        '$ Carmell Austria, MD 11/20/2022, 8:53 AM  Cc: Unk Pinto, MD

## 2023-01-12 ENCOUNTER — Encounter: Payer: Self-pay | Admitting: Gastroenterology

## 2023-01-12 ENCOUNTER — Ambulatory Visit (AMBULATORY_SURGERY_CENTER): Payer: 59 | Admitting: Gastroenterology

## 2023-01-12 VITALS — BP 134/76 | HR 67 | Temp 97.8°F | Resp 21 | Ht 64.0 in | Wt 174.0 lb

## 2023-01-12 DIAGNOSIS — K219 Gastro-esophageal reflux disease without esophagitis: Secondary | ICD-10-CM

## 2023-01-12 DIAGNOSIS — K449 Diaphragmatic hernia without obstruction or gangrene: Secondary | ICD-10-CM

## 2023-01-12 MED ORDER — SUCRALFATE 1 G PO TABS: 1.0000 g | ORAL_TABLET | Freq: Four times a day (QID) | ORAL | 0 refills | Status: DC

## 2023-01-12 MED ORDER — SODIUM CHLORIDE 0.9 % IV SOLN: 500.0000 mL | Freq: Once | INTRAVENOUS | Status: DC

## 2023-01-12 MED ORDER — SUCRALFATE 1 G PO TABS: 1.0000 g | ORAL_TABLET | Freq: Four times a day (QID) | ORAL | 3 refills | Status: DC

## 2023-01-12 NOTE — Progress Notes (Signed)
Called to room to assist during endoscopic procedure.  Patient ID and intended procedure confirmed with present staff. Received instructions for my participation in the procedure from the performing physician.  

## 2023-01-12 NOTE — Progress Notes (Signed)
Chief Complaint: For GERD  Referring Provider:  Unk Pinto, MD      ASSESSMENT AND PLAN;   #1.  Refractory GERD with nocturnal symptoms.   Plan: -Continue protonix 40 BID -EGD for further evaluation. -If still with problems, add pepcid '20mg'$  po QHS -If still with problems, [place] abdo to r/o GB pathology. -Nonpharmacologic means of reflux control.  Proceed with EGD. I have discussed the risks and benefits. The risks including rare risk of perforation, bleeding, missed UGI neoplasms, risks of anesthesia/sedation. Alternatives were given. Patient is aware and agrees to proceed. All the questions were answered. This will be scheduled in upcoming days. Consent forms were given for review. HPI:    Beth Miranda is a 63 y.o. female  With Mixed Connective disease/Raynaud's, HTN, hypothyroidism  C/O GERD x yrs -mostly heartburn and regurgitation Somewhat better with protonix 40 BID, takes TUMS prn Has been sleeping on a recliner as she would have significant nocturnal symptoms.  She is definitely somewhat better since Protonix has been increased to twice daily.  No odynophagia or dysphagia.  No fever chills or night sweats.  No melena or hematochezia.  No significant abdominal pain.  Never had EGD.  No sodas, chocolates, chewing gums, artificial sweeteners and candy. No NSAIDs  No weight gain or weight loss.  She denies having any diarrhea or constipation. Wt Readings from Last 3 Encounters:  01/12/23 174 lb (78.9 kg)  11/20/22 174 lb 6 oz (79.1 kg)  10/31/22 176 lb 9.6 oz (80.1 kg)   Previous GI workup:  Colon 06/06/2022 - Mild predominantly sigmoid diverticulosis - Non-bleeding internal hemorrhoids. - The examined portion of the ileum was normal. - The examination was otherwise normal on direct and retroflexion views. - Rpt in 10 yrs. Earlier, if with any new problems or change in family history.  Past Medical History:  Diagnosis Date   Allergy    seasonal   GERD  (gastroesophageal reflux disease)    Hyperlipidemia    Labile hypertension    Lyme disease    in the 1990/s   Prediabetes    Raynaud's disease    Thyroid disease    hypothyroid   Vitamin D deficiency     Past Surgical History:  Procedure Laterality Date   COLONOSCOPY     FOOT SURGERY  09/28/2020   TENDON REPAIR    TONSILLECTOMY      Family History  Problem Relation Age of Onset   Arthritis Mother        Living   COPD Mother    Asthma Mother    Hypertension Father        Living   Diabetes Father    Kidney disease Father        Stones   Hyperlipidemia Father    Other Brother        Deceased   Pancreatic cancer Paternal Grandmother    Breast cancer Neg Hx    Colon cancer Neg Hx    Esophageal cancer Neg Hx    Rectal cancer Neg Hx    Stomach cancer Neg Hx     Social History   Tobacco Use   Smoking status: Never   Smokeless tobacco: Never  Vaping Use   Vaping Use: Never used  Substance Use Topics   Alcohol use: Yes    Comment: occasionally   Drug use: No    Current Outpatient Medications  Medication Sig Dispense Refill   aspirin 81 MG tablet Take 81 mg  by mouth daily.     Cetirizine HCl (ZYRTEC ALLERGY PO) Take 10 mg by mouth at bedtime.     CINNAMON PO Take by mouth.     Cyanocobalamin (VITAMIN B 12 PO) Take 1,000 mcg by mouth daily.     escitalopram (LEXAPRO) 20 MG tablet Take 1 tablet  Daily  for Mood                                                            /                                  TAKE                                     BY                                     MOUTH                                 ONCE DAILY 90 tablet 3   gabapentin (NEURONTIN) 600 MG tablet Take  1/2 to 1 tablet  2 to 3 x  / Daily as needed for Pain 270 tablet 1   levothyroxine (SYNTHROID) 50 MCG tablet TAKE ONE TABLET BY MOUTH ONCE DAILY ON AN EMPTY STOMACH.NO ANTACID MEDS, CALCIUM, OR MAGNESIUM FOR 4 HOURS. AVOID BIOTIN. 90 tablet 3   Magnesium 400 MG TABS Take 1 tablet by  mouth 2 (two) times daily.     Melatonin 10 MG TABS Take by mouth.     Omega-3 Fatty Acids (OMEGA-3 FISH OIL) 1200 MG CAPS Take 2 capsules by mouth daily.     pantoprazole (PROTONIX) 40 MG tablet Take  1 tablet  2 x /day (every 12 hours) for Heartburn & Acid Reflux 180 tablet 3   phentermine (ADIPEX-P) 37.5 MG tablet Take 1/2 to 1 tablet every Morning for Dieting & Weight Loss 90 tablet 1   rosuvastatin (CRESTOR) 20 MG tablet Take 1 tablet Daily for Cholesterol 90 tablet 3   topiramate (TOPAMAX) 50 MG tablet Take 1/2 to 1 tablet 2 x /day at Suppertime & Bedtime for Dieting & Weight Loss 180 tablet 1   VITAMIN D PO Take 5,000 Int'l Units by mouth.     betamethasone dipropionate 0.05 % cream Apply 1 application topically 3 (three) times daily. (Patient not taking: Reported on 01/12/2023)     cyclobenzaprine (FLEXERIL) 5 MG tablet Take 1 tablet (5 mg total) by mouth 3 (three) times daily as needed for muscle spasms. (Patient not taking: Reported on 01/12/2023) 60 tablet 0   Ferrous Sulfate (IRON PO) Take by mouth 3 (three) times a week.     Current Facility-Administered Medications  Medication Dose Route Frequency Provider Last Rate Last Admin   0.9 %  sodium chloride infusion  500 mL Intravenous Once Jackquline Denmark, MD        Allergies  Allergen Reactions   Penicillins Hives   Sulfa Antibiotics Hives  Review of Systems:  neg     Physical Exam:    BP 136/83   Pulse 75   Temp 97.8 F (36.6 C)   Ht '5\' 4"'$  (1.626 m)   Wt 174 lb (78.9 kg)   LMP 03/02/2013   SpO2 99%   BMI 29.87 kg/m  Wt Readings from Last 3 Encounters:  01/12/23 174 lb (78.9 kg)  11/20/22 174 lb 6 oz (79.1 kg)  10/31/22 176 lb 9.6 oz (80.1 kg)   Constitutional:  Well-developed, in no acute distress. Psychiatric: Normal mood and affect. Behavior is normal. HEENT: Pupils normal.  Conjunctivae are normal. No scleral icterus. Cardiovascular: Normal rate, regular rhythm. No edema Pulmonary/chest: Effort normal and  breath sounds normal. No wheezing, rales or rhonchi. Abdominal: Soft, nondistended. Nontender. Bowel sounds active throughout. There are no masses palpable. No hepatomegaly. Rectal: Deferred Neurological: Alert and oriented to person place and time. Skin: Skin is warm and dry. No rashes noted.  Data Reviewed: I have personally reviewed following labs and imaging studies  CBC:    Latest Ref Rng & Units 10/31/2022   12:00 AM 12/19/2021    4:17 PM 09/18/2021   10:53 AM  CBC  WBC 3.8 - 10.8 Thousand/uL 4.4  5.3  6.0   Hemoglobin 11.7 - 15.5 g/dL 13.7  13.6  13.8   Hematocrit 35.0 - 45.0 % 42.1  41.8  42.5   Platelets 140 - 400 Thousand/uL 250  248  206     CMP:    Latest Ref Rng & Units 10/31/2022   12:00 AM 12/19/2021    4:17 PM 09/18/2021   10:53 AM  CMP  Glucose 65 - 99 mg/dL 86  88  87   BUN 7 - 25 mg/dL '15  11  19   '$ Creatinine 0.50 - 1.05 mg/dL 0.81  0.90  1.06   Sodium 135 - 146 mmol/L 143  141  143   Potassium 3.5 - 5.3 mmol/L 4.8  4.7  5.0   Chloride 98 - 110 mmol/L 105  104  105   CO2 20 - 32 mmol/L 32  33  31   Calcium 8.6 - 10.4 mg/dL 10.0  9.6  9.9   Total Protein 6.1 - 8.1 g/dL 7.3  7.4  7.3   Total Bilirubin 0.2 - 1.2 mg/dL 0.4  0.4  0.4   AST 10 - 35 U/L '18  20  20   '$ ALT 6 - 29 U/L '11  12  15        '$ Carmell Austria, MD 01/12/2023, 1:19 PM  Cc: Unk Pinto, MD

## 2023-01-12 NOTE — Op Note (Signed)
[facility] Patient Name: Beth Miranda Procedure Date: 01/12/2023 1:26 PM MRN: [id number] Endoscopist: Jackquline Denmark , MD, [id number] Age: 62 Referring MD:  Date of Birth: 09/05/1961 Gender: Female Account #: [id number] Procedure:                Upper GI endoscopy Indications:              Refractory reflux, in a patient with history of                            mixed connective tissue disorder/Raynaud's. Medicines:                Monitored Anesthesia Care Procedure:                Pre-Anesthesia Assessment:                           - Prior to the procedure, a History and Physical                            was performed, and patient medications and                            allergies were reviewed. The patient's tolerance of                            previous anesthesia was also reviewed. The risks                            and benefits of the procedure and the sedation                            options and risks were discussed with the patient.                            All questions were answered, and informed consent                            was obtained. Prior Anticoagulants: The patient has                            taken no anticoagulant or antiplatelet agents. ASA                            Grade Assessment: II - A patient with mild systemic                            disease. After reviewing the risks and benefits,                            the patient was deemed in satisfactory condition to                            undergo the procedure.  After obtaining informed consent, the endoscope was                            passed under direct vision. Throughout the                            procedure, the patient's blood pressure, pulse, and                            oxygen saturations were monitored continuously. The                            GIF [id number] #[id number] was introduced through the                            mouth, and  advanced to the second part of duodenum.                            The upper GI endoscopy was accomplished without                            difficulty. The patient tolerated the procedure                            well. Scope In: Scope Out: Findings:                 The examined esophagus was mildly dilated and                            tortuous. Biopsies were obtained from the proximal                            and mid esophagus with cold forceps for histology                            to r/o eosinophilic esophagitis.                           A 2 cm hiatal hernia was present (extending from 35                            cm up to 37 cm). Multiple biopsies were obtained at                            Z-line from all 4 quadrants, directed by NBI.                           The entire examined stomach was normal. Biopsies                            were taken with a cold forceps for histology. The  retroflexed examination of the cardia did reveal a                            GE junction flap to be Hill's grade III.                           The examined duodenum was normal. Biopsies for                            histology were taken with a cold forceps for                            evaluation of celiac disease. Complications:            No immediate complications. Estimated Blood Loss:     Estimated blood loss: none. Impression:               - Mildly torturous esophagus.                           - 2 cm hiatal hernia. Recommendation:           - Patient has a contact number available for                            emergencies. The signs and symptoms of potential                            delayed complications were discussed with the                            patient. Return to normal activities tomorrow.                            Written discharge instructions were provided to the                            patient.                           - Resume  previous diet.                           - Continue present medications including Protonix                            40 mg p.o. twice daily.                           - Use sucralfate tablets 1 gram PO QID for 2 weeks.                           - Await pathology results.                           - The findings and recommendations were discussed  with the patient's family.                           - FU in 12 weeks.                           - Follow an antireflux regimen. If continued                            problems, would recommend high-resolution                            manometry/pH study, followed by consultation for                            TIF/Nissen's. Jackquline Denmark, MD 01/12/2023 1:46:51 PM This report has been signed electronically.

## 2023-01-12 NOTE — Progress Notes (Signed)
Pt's states no medical or surgical changes since previsit or office visit. 

## 2023-01-12 NOTE — Progress Notes (Signed)
Report to PACU, RN, vss, BBS= Clear.  

## 2023-01-12 NOTE — Patient Instructions (Addendum)
Resume previous diet(see information re: GERD diet) Resume previous medications. Begin taking Carafate for 2 weeks, 1 tablet 30 minutes before meals and then before bedtimeYOU HAD AN ENDOSCOPIC PROCEDURE TODAY [place]:   Refer to the procedure report that was given to you for any specific questions about what was found during the examination.  If the procedure report does not answer your questions, please call your gastroenterologist to clarify.  If you requested that your care partner not be given the details of your procedure findings, then the procedure report has been included in a sealed envelope for you to review at your convenience later.  YOU SHOULD EXPECT: Some feelings of bloating in the abdomen. Passage of more gas than usual.  Walking can help get rid of the air that was put into your GI tract during the procedure and reduce the bloating. If you had a lower endoscopy (such as a colonoscopy or flexible sigmoidoscopy) you may notice spotting of blood in your stool or on the toilet paper. If you underwent a bowel prep for your procedure, you may not have a normal bowel movement for a few days.  Please Note:  You might notice some irritation and congestion in your nose or some drainage.  This is from the oxygen used during your procedure.  There is no need for concern and it should clear up in a day or so.  SYMPTOMS TO REPORT IMMEDIATELY:  Following upper endoscopy (EGD)  Vomiting of blood or coffee ground material  New chest pain or pain under the shoulder blades  Painful or persistently difficult swallowing  New shortness of breath  Fever of 100F or higher  Black, tarry-looking stools  For urgent or emergent issues, a gastroenterologist can be reached at any hour by calling [phone]. Do not use MyChart messaging for urgent concerns.   DIET:  We do recommend a small meal at first, but then you may proceed to your regular diet.  Drink plenty of fluids but  you should avoid alcoholic beverages for 24 hours.  ACTIVITY:  You should plan to take it easy for the rest of today and you should NOT DRIVE or use heavy machinery until tomorrow (because of the sedation medicines used during the test).    FOLLOW UP: Our staff will call the number listed on your records the next business day following your procedure.  We will call around 7:15- 8:00 am to check on you and address any questions or concerns that you may have regarding the information given to you following your procedure. If we do not reach you, we will leave a message.     If any biopsies were taken you will be contacted by phone or by letter within the next 1-3 weeks.  Please call us at [phone] if you have not heard about the biopsies in 3 weeks.    SIGNATURES/CONFIDENTIALITY: You and/or your care partner have signed paperwork which will be entered into your electronic medical record.  These signatures attest to the fact that that the information above on your After Visit Summary has been reviewed and is understood.  Full responsibility of the confidentiality of this discharge information lies with you and/or your care-partner.

## 2023-01-13 ENCOUNTER — Telehealth: Payer: Self-pay

## 2023-01-13 NOTE — Telephone Encounter (Signed)
  Returned patient call.  Patient reports globulus sensation but states it has improved since last night.  Explained to patient this should subside over the next days.  May take PRN tylenol or throat sprays if necessary.  Patient asked about the sucralfate tablets to clarify that she only takes them for 2 weeks.  Encouraged patient to call the office if after 2 weeks she feels longer therapy is needed or a change needs to be made.  Patient verbalized understanding.

## 2023-01-13 NOTE — Telephone Encounter (Signed)
Left message on answering machine. 

## 2023-01-13 NOTE — Telephone Encounter (Signed)
Patient returned call, requesting a call back from nurse.

## 2023-01-17 ENCOUNTER — Encounter: Payer: Self-pay | Admitting: Gastroenterology

## 2023-02-04 NOTE — Progress Notes (Signed)
3 MONTH FOLLOW UP  Assessment and Plan:  Hypertension At goal at this time off of medications Monitor blood pressure at home; patient to call if consistently greater than 130/80 Continue DASH diet.   Reminder to go to the ER if any CP, SOB, nausea, dizziness, severe HA, changes vision/speech, left arm numbness and tingling and jaw pain. - CBC  Cholesterol Currently controlled on Rosuvastatin 20 mg QD goal maintain LDL <130 Continue low cholesterol diet and exercise.  Check lipid panel.   Abnormal glucose Discussed diet/exercise, weight management  Defer A1C; check CMP  Hypothyroidism continue medications the same pending lab results reminded to take on an empty stomach 30-90mns before food.  check TSH level  Obesity Continue to recommend diet heavy in fruits and veggies and low in animal meats, cheeses, and dairy products, appropriate calorie intake Discuss exercise recommendations routinely Continue to monitor weight at each visit  Vitamin D Def Near goal at last visit; continue supplementation to maintain goal of 60-100 Defer Vit D level  GERD Well managed on current medications; didn't tolerate taper from PPI to HAmanda [place]to follow with GI Dr. GLyndel SafeDiscussed diet, avoiding triggers and other lifestyle changes - Magnesium  Major depression with anxiety (HCC) Continue Lexapro 20 mg Stress management techniques discussed, increase water, good sleep hygiene discussed, increase exercise, and increase veggies.    Insomnia, unspecified type Increase gabapentin to 3 tabs at bedtime Practice good sleep hygiene  Medication Management - Magnesium   Further disposition pending results of labs. Discussed med's effects and SE's.   Over 30 minutes of exam, counseling, chart review, and critical decision making was performed.   Future Appointments  Date Time Provider [place] 04/10/2023  1:30 PM [name] 05/08/2023 10:00 AM  Beth Pinto MD Beth Miranda None  05/29/2023  8:00 AM Beth Bruins MD Beth Miranda None  11/06/2023 11:00 AM Beth Pinto MD Beth Miranda None    ------------------------------------------------------------------------------------------------------------------   HPI BP 116/62   Pulse 87   Temp 97.7 F (36.5 C)   Ht '5\' 4"'$  (1.626 m)   Wt 156 lb 3.2 oz (70.9 kg)   LMP 03/02/2013   SpO2 99%   BMI 26.81 kg/m   62 y.o.female presents for 3 month follow up of hyperlipidemia, hypothyroidism, abnormal glucose, Vit D deficiency, GERD, abnormal glucose, depression  She is having difficulty sleeping.   She takes Gabapentin and Melatonin at night so has been falling asleep ok.  She has been staying asleep much better. She tried Trazodone in the past but did not help with sleep.  She does have reflux and hiatal hernia, currently on Protonix, carafate. Had endoscopy with Dr. [name] has started the carafate and is doing better.   BMI is Body mass index is 26.81 kg/m., she has been working on diet and exercise. She has lost 18 pounds in the past 2 months. She is on Phentermine and Topamax without side effects.  Wt Readings from Last 3 Encounters:  02/06/23 156 lb 3.2 oz (70.9 kg)  01/12/23 174 lb (78.9 kg)  11/20/22 174 lb 6 oz (79.1 kg)   Her blood pressure has been controlled at home, today their BP is BP: 116/62 BP Readings from Last 3 Encounters:  02/06/23 116/62  01/12/23 134/76  11/20/22 102/78     She does workout. She denies chest pain, shortness of breath, dizziness.  She is on cholesterol medication, Rosuvastatin 20 mg daily and denies myalgias. Her cholesterol is not at goal.  The cholesterol last visit was:   Lab Results  Component Value Date   CHOL 158 10/31/2022   HDL 71 10/31/2022   LDLCALC 73 10/31/2022   TRIG 67 10/31/2022   CHOLHDL 2.2 10/31/2022    She has been working on diet and exercise for glucose management, and denies increased appetite, paresthesia  of the feet, polydipsia, polyuria, visual disturbances and vomiting. Last A1C in the office was:  Lab Results  Component Value Date   HGBA1C 5.8 (H) 10/31/2022   Patient is on Vitamin D supplement.   Lab Results  Component Value Date   VD25OH 42 10/31/2022         Past Medical History:  Diagnosis Date   Allergy    seasonal   GERD (gastroesophageal reflux disease)    Hyperlipidemia    Labile hypertension    Lyme disease    in the 1990/s   Prediabetes    Raynaud's disease    Thyroid disease    hypothyroid   Vitamin D deficiency      Allergies  Allergen Reactions   Penicillins Hives   Sulfa Antibiotics Hives    Current Outpatient Medications on File Prior to Visit  Medication Sig   aspirin 81 MG tablet Take 81 mg by mouth daily.   Cetirizine HCl (ZYRTEC ALLERGY PO) Take 10 mg by mouth at bedtime.   CINNAMON PO Take by mouth.   Cyanocobalamin (VITAMIN B 12 PO) Take 1,000 mcg by mouth daily.   escitalopram (LEXAPRO) 20 MG tablet Take 1 tablet  Daily  for Mood                                                            /                                  TAKE                                     BY                                     MOUTH                                 ONCE DAILY   Ferrous Sulfate (IRON PO) Take by mouth 3 (three) times a week.   gabapentin (NEURONTIN) 600 MG tablet Take  1/2 to 1 tablet  2 to 3 x  / Daily as needed for Pain   levothyroxine (SYNTHROID) 50 MCG tablet TAKE ONE TABLET BY MOUTH ONCE DAILY ON AN EMPTY STOMACH.NO ANTACID MEDS, CALCIUM, OR MAGNESIUM FOR 4 HOURS. AVOID BIOTIN.   Magnesium 400 MG TABS Take 1 tablet by mouth 2 (two) times daily.   Melatonin 10 MG TABS Take by mouth.   Omega-3 Fatty Acids (OMEGA-3 FISH OIL) 1200 MG CAPS Take 2 capsules by mouth daily.   pantoprazole (PROTONIX) 40 MG tablet Take  1 tablet  2 x /day (every 12 hours) for Heartburn &  Acid Reflux   phentermine (ADIPEX-P) 37.5 MG tablet Take 1/2 to 1 tablet every Morning  for Dieting & Weight Loss   rosuvastatin (CRESTOR) 20 MG tablet Take 1 tablet Daily for Cholesterol   sucralfate (CARAFATE) 1 g tablet Take 1 tablet (1 g total) by mouth 4 (four) times daily.   topiramate (TOPAMAX) 50 MG tablet Take 1/2 to 1 tablet 2 x /day at Suppertime & Bedtime for Dieting & Weight Loss   VITAMIN D PO Take 5,000 Int'l Units by mouth.   No current facility-administered medications on file prior to visit.    ROS: Review of Systems  Constitutional:  Negative for chills, diaphoresis, fever, malaise/fatigue and weight loss.  HENT:  Negative for hearing loss and tinnitus.   Eyes:  Negative for blurred vision and double vision.  Respiratory:  Negative for shortness of breath and wheezing.   Cardiovascular:  Negative for chest pain, palpitations, orthopnea, claudication and leg swelling.  Gastrointestinal:  Negative for abdominal pain, blood in stool, constipation, diarrhea, heartburn, melena, nausea and vomiting.  Genitourinary: Negative.   Musculoskeletal:  Negative for joint pain, myalgias and neck pain.  Skin:  Negative for rash.  Neurological:  Negative for dizziness, tingling, sensory change, weakness and headaches.  Endo/Heme/Allergies:  Negative for polydipsia.  Psychiatric/Behavioral:  Negative for hallucinations, substance abuse and suicidal ideas. The patient has insomnia.   All other systems reviewed and are negative.    Physical Exam:  BP 116/62   Pulse 87   Temp 97.7 F (36.5 C)   Ht '5\' 4"'$  (1.626 m)   Wt 156 lb 3.2 oz (70.9 kg)   LMP 03/02/2013   SpO2 99%   BMI 26.81 kg/m   General Appearance: Well nourished, in no apparent distress. Eyes: PERRLA, EOMs, conjunctiva no swelling or erythema Sinuses: No Frontal/maxillary tenderness ENT/Mouth: Ext aud canals clear, TMs without erythema, bulging. Mask in place; oral exam deferred. Hearing normal.  Neck: Supple, thyroid normal.  Respiratory: Respiratory effort normal, BS equal bilaterally without rales,  rhonchi, wheezing or stridor.  Cardio: RRR with no MRGs. Brisk peripheral pulses without edema.  Abdomen: Soft, + BS.  Non tender, no guarding, rebound, hernias, masses. Lymphatics: Non tender without lymphadenopathy.  Musculoskeletal: Full ROM, 5/5 strength, normal gait.  Skin: Warm, dry without rashes.  Neuro: Cranial nerves intact. Normal muscle tone, no cerebellar symptoms. Sensation intact.  Psych: Awake and oriented X 3, depressed/tearful affect, Insight and Judgment appropriate.    A trigger point injection was performed at the site of maximal tenderness in 4 spots of the left trapezius using 1% plain Lidocaine and dexamethasone. This was well tolerated, and followed by moderate relief of pain.   Alycia Rossetti, NP 9:41 AM [facility]

## 2023-02-06 ENCOUNTER — Ambulatory Visit: Payer: 59 | Admitting: Nurse Practitioner

## 2023-02-06 ENCOUNTER — Encounter: Payer: Self-pay | Admitting: Nurse Practitioner

## 2023-02-06 VITALS — BP 116/62 | HR 87 | Temp 97.7°F | Ht 64.0 in | Wt 156.2 lb

## 2023-02-06 DIAGNOSIS — E039 Hypothyroidism, unspecified: Secondary | ICD-10-CM

## 2023-02-06 DIAGNOSIS — E782 Mixed hyperlipidemia: Secondary | ICD-10-CM

## 2023-02-06 DIAGNOSIS — R0989 Other specified symptoms and signs involving the circulatory and respiratory systems: Secondary | ICD-10-CM

## 2023-02-06 DIAGNOSIS — K219 Gastro-esophageal reflux disease without esophagitis: Secondary | ICD-10-CM

## 2023-02-06 DIAGNOSIS — G4700 Insomnia, unspecified: Secondary | ICD-10-CM

## 2023-02-06 DIAGNOSIS — R7309 Other abnormal glucose: Secondary | ICD-10-CM

## 2023-02-06 DIAGNOSIS — Z79899 Other long term (current) drug therapy: Secondary | ICD-10-CM

## 2023-02-06 DIAGNOSIS — F321 Major depressive disorder, single episode, moderate: Secondary | ICD-10-CM | POA: Diagnosis not present

## 2023-02-06 DIAGNOSIS — E669 Obesity, unspecified: Secondary | ICD-10-CM

## 2023-02-06 DIAGNOSIS — E559 Vitamin D deficiency, unspecified: Secondary | ICD-10-CM

## 2023-02-06 NOTE — Patient Instructions (Signed)

## 2023-02-07 LAB — CBC WITH DIFFERENTIAL/PLATELET
Absolute Monocytes: 378 cells/uL (ref 200–950)
Basophils Absolute: 29 cells/uL (ref 0–200)
Basophils Relative: 0.8 %
Eosinophils Absolute: 130 cells/uL (ref 15–500)
Eosinophils Relative: 3.6 %
HCT: 40.1 % (ref 35.0–45.0)
Hemoglobin: 13.1 g/dL (ref 11.7–15.5)
Lymphs Abs: 1138 cells/uL (ref 850–3900)
MCH: 28.7 pg (ref 27.0–33.0)
MCHC: 32.7 g/dL (ref 32.0–36.0)
MCV: 87.7 fL (ref 80.0–100.0)
MPV: 10.7 fL (ref 7.5–12.5)
Monocytes Relative: 10.5 %
Neutro Abs: 1926 cells/uL (ref 1500–7800)
Neutrophils Relative %: 53.5 %
Platelets: 227 10*3/uL (ref 140–400)
RBC: 4.57 10*6/uL (ref 3.80–5.10)
RDW: 13.4 % (ref 11.0–15.0)
Total Lymphocyte: 31.6 %
WBC: 3.6 10*3/uL — ABNORMAL LOW (ref 3.8–10.8)

## 2023-02-07 LAB — LIPID PANEL
Cholesterol: 138 mg/dL (ref <200)
HDL: 65 mg/dL (ref >=50)
LDL Cholesterol (Calc): 57 mg/dL (calc)
Non-HDL Cholesterol (Calc): 73 mg/dL (calc) (ref <130)
Total CHOL/HDL Ratio: 2.1 (calc) (ref <5.0)
Triglycerides: 80 mg/dL (ref <150)

## 2023-02-07 LAB — COMPLETE METABOLIC PANEL WITH GFR
AG Ratio: 1.6 (calc) (ref 1.0–2.5)
ALT: 11 U/L (ref 6–29)
AST: 16 U/L (ref 10–35)
Albumin: 4.2 g/dL (ref 3.6–5.1)
Alkaline phosphatase (APISO): 46 U/L (ref 37–153)
BUN: 11 mg/dL (ref 7–25)
CO2: 28 mmol/L (ref 20–32)
Calcium: 10.1 mg/dL (ref 8.6–10.4)
Chloride: 109 mmol/L (ref 98–110)
Creat: 0.86 mg/dL (ref 0.50–1.05)
Globulin: 2.7 g/dL (calc) (ref 1.9–3.7)
Glucose, Bld: 86 mg/dL (ref 65–99)
Potassium: 4.2 mmol/L (ref 3.5–5.3)
Sodium: 145 mmol/L (ref 135–146)
Total Bilirubin: 0.4 mg/dL (ref 0.2–1.2)
Total Protein: 6.9 g/dL (ref 6.1–8.1)
eGFR: 77 mL/min/{1.73_m2} (ref >=60)

## 2023-02-07 LAB — MAGNESIUM: Magnesium: 2.2 mg/dL (ref 1.5–2.5)

## 2023-02-07 LAB — TSH: TSH: 2.56 mIU/L (ref 0.40–4.50)

## 2023-03-28 ENCOUNTER — Other Ambulatory Visit: Payer: Self-pay | Admitting: Gastroenterology

## 2023-04-10 ENCOUNTER — Encounter: Payer: Self-pay | Admitting: Gastroenterology

## 2023-04-10 ENCOUNTER — Ambulatory Visit: Payer: 59 | Admitting: Gastroenterology

## 2023-04-10 VITALS — BP 110/64 | HR 62 | Ht 64.0 in | Wt 150.0 lb

## 2023-04-10 DIAGNOSIS — R1013 Epigastric pain: Secondary | ICD-10-CM

## 2023-04-10 DIAGNOSIS — K219 Gastro-esophageal reflux disease without esophagitis: Secondary | ICD-10-CM | POA: Diagnosis not present

## 2023-04-10 MED ORDER — SUCRALFATE 1 G PO TABS: 1.0000 g | ORAL_TABLET | Freq: Two times a day (BID) | ORAL | 0 refills | Status: DC

## 2023-04-10 NOTE — Progress Notes (Signed)
Chief Complaint: For GERD  Referring Provider:  Lucky Cowboy, MD      ASSESSMENT AND PLAN;   #1. Refractory GERD with nocturnal symptoms. #2. Epi pain (mild)   Plan: -Continue protonix 40 BID -[place] abdo to r/o GB pathology -If still with problems, GES to r/o gastroparesis. -Decrease Carafate 1g PO BID x 2 weeks, then stop -Nonpharmacologic means of reflux control. -He wants to hold off on manometry/TIF eval currently. -FU in 12-18 weeks.   HPI:    Tarshia Kot is a 62 y.o. female  With Mixed Connective disease/Raynaud's, HTN, hypothyroidism  For FU EGD revealed 2 cm HH without esophagitis.  Eso biopsies positive for mild reflux.  She is doing much better with Protonix 40 mg p.o. twice daily And Carafate 1 g p.o. 3 times daily  Denies having any further nocturnal symptoms.  No regurgitation.  She denies having any odynophagia or dysphagia.  Mild epigastric discomfort/pain.  More problems when she bends.  Has also been able to lose weight which helps.   From previous notes: C/O GERD x yrs -mostly heartburn and regurgitation (worst with onions, tomato sauce) Somewhat better with protonix 40 BID, takes TUMS prn Has been sleeping on a recliner as she would have significant nocturnal symptoms.  She is definitely somewhat better since Protonix has been increased to twice daily.  No odynophagia or dysphagia.  No fever chills or night sweats.  No melena or hematochezia.  No significant abdominal pain.  Wt Readings from Last 3 Encounters:  04/10/23 150 lb (68 kg)  02/06/23 156 lb 3.2 oz (70.9 kg)  01/12/23 174 lb (78.9 kg)   Previous GI workup:   EGD 01/12/2023 - Mildly torturous esophagus.  - 2 cm hiatal hernia. - Neg Bx for HO/celiac/EoE. Eso bx did show mild reflux  Colon 06/06/2022 - Mild predominantly sigmoid diverticulosis - Non-bleeding internal hemorrhoids. - The examined portion of the ileum was normal. - The examination was otherwise normal on  direct and retroflexion views. - Rpt in 10 yrs. Earlier, if with any new problems or change in family history.  Past Medical History:  Diagnosis Date   Allergy    seasonal   GERD (gastroesophageal reflux disease)    Hyperlipidemia    Labile hypertension    Lyme disease    in the 1990/s   Prediabetes    Raynaud's disease    Thyroid disease    hypothyroid   Vitamin D deficiency     Past Surgical History:  Procedure Laterality Date   COLONOSCOPY     FOOT SURGERY  09/28/2020   TENDON REPAIR    TONSILLECTOMY      Family History  Problem Relation Age of Onset   Arthritis Mother        Living   COPD Mother    Asthma Mother    Hypertension Father        Living   Diabetes Father    Kidney disease Father        Stones   Hyperlipidemia Father    Other Brother        Deceased   Pancreatic cancer Paternal Grandmother    Breast cancer Neg Hx    Colon cancer Neg Hx    Esophageal cancer Neg Hx    Rectal cancer Neg Hx    Stomach cancer Neg Hx     Social History   Tobacco Use   Smoking status: Never   Smokeless tobacco: Never  Vaping Use  Vaping Use: Never used  Substance Use Topics   Alcohol use: Yes    Comment: occasionally   Drug use: No    Current Outpatient Medications  Medication Sig Dispense Refill   aspirin 81 MG tablet Take 81 mg by mouth daily.     Cetirizine HCl (ZYRTEC ALLERGY PO) Take 10 mg by mouth at bedtime.     CINNAMON PO Take by mouth.     Cyanocobalamin (VITAMIN B 12 PO) Take 1,000 mcg by mouth daily.     escitalopram (LEXAPRO) 20 MG tablet Take 1 tablet  Daily  for Mood                                                            /                                  TAKE                                     BY                                     MOUTH                                 ONCE DAILY 90 tablet 3   Ferrous Sulfate (IRON PO) Take by mouth 3 (three) times a week.     gabapentin (NEURONTIN) 600 MG tablet Take  1/2 to 1 tablet  2 to 3 x  /  Daily as needed for Pain 270 tablet 1   levothyroxine (SYNTHROID) 50 MCG tablet TAKE ONE TABLET BY MOUTH ONCE DAILY ON AN EMPTY STOMACH.NO ANTACID MEDS, CALCIUM, OR MAGNESIUM FOR 4 HOURS. AVOID BIOTIN. 90 tablet 3   Magnesium 400 MG TABS Take 1 tablet by mouth 2 (two) times daily.     Melatonin 10 MG TABS Take by mouth.     Omega-3 Fatty Acids (OMEGA-3 FISH OIL) 1200 MG CAPS Take 2 capsules by mouth daily.     pantoprazole (PROTONIX) 40 MG tablet Take  1 tablet  2 x /day (every 12 hours) for Heartburn & Acid Reflux 180 tablet 3   phentermine (ADIPEX-P) 37.5 MG tablet Take 1/2 to 1 tablet every Morning for Dieting & Weight Loss 90 tablet 1   rosuvastatin (CRESTOR) 20 MG tablet Take 1 tablet Daily for Cholesterol 90 tablet 3   sucralfate (CARAFATE) 1 g tablet Take 1 tablet (1 g total) by mouth 4 (four) times daily. 56 tablet 3   topiramate (TOPAMAX) 50 MG tablet Take 1/2 to 1 tablet 2 x /day at Suppertime & Bedtime for Dieting & Weight Loss 180 tablet 1   VITAMIN D PO Take 5,000 Int'l Units by mouth.     No current facility-administered medications for this visit.    Allergies  Allergen Reactions   Penicillins Hives   Sulfa Antibiotics Hives    Review of Systems:  neg     Physical Exam:    BP  110/64   Pulse 62   Ht 5\' 4"  (1.626 m)   Wt 150 lb (68 kg)   LMP 03/02/2013   BMI 25.75 kg/m  Wt Readings from Last 3 Encounters:  04/10/23 150 lb (68 kg)  02/06/23 156 lb 3.2 oz (70.9 kg)  01/12/23 174 lb (78.9 kg)   Constitutional:  Well-developed, in no acute distress. Psychiatric: Normal mood and affect. Behavior is normal. HEENT: Pupils normal.  Conjunctivae are normal. No scleral icterus. Cardiovascular: Normal rate, regular rhythm. No edema Pulmonary/chest: Effort normal and breath sounds normal. No wheezing, rales or rhonchi. Abdominal: Soft, nondistended. Nontender. Bowel sounds active throughout. There are no masses palpable. No hepatomegaly. Rectal: Deferred Neurological:  Alert and oriented to person place and time. Skin: Skin is warm and dry. No rashes noted.  Data Reviewed: I have personally reviewed following labs and imaging studies  CBC:    Latest Ref Rng & Units 02/06/2023   10:07 AM 10/31/2022   12:00 AM 12/19/2021    4:17 PM  CBC  WBC 3.8 - 10.8 Thousand/uL 3.6  4.4  5.3   Hemoglobin 11.7 - 15.5 g/dL 16.1  09.6  04.5   Hematocrit 35.0 - 45.0 % 40.1  42.1  41.8   Platelets 140 - 400 Thousand/uL 227  250  248     CMP:    Latest Ref Rng & Units 02/06/2023   10:07 AM 10/31/2022   12:00 AM 12/19/2021    4:17 PM  CMP  Glucose 65 - 99 mg/dL 86  86  88   BUN 7 - 25 mg/dL 11  15  11    Creatinine 0.50 - 1.05 mg/dL 4.09  8.11  9.14   Sodium 135 - 146 mmol/L 145  143  141   Potassium 3.5 - 5.3 mmol/L 4.2  4.8  4.7   Chloride 98 - 110 mmol/L 109  105  104   CO2 20 - 32 mmol/L 28  32  33   Calcium 8.6 - 10.4 mg/dL 78.2  95.6  9.6   Total Protein 6.1 - 8.1 g/dL 6.9  7.3  7.4   Total Bilirubin 0.2 - 1.2 mg/dL 0.4  0.4  0.4   AST 10 - 35 U/L 16  18  20    ALT 6 - 29 U/L 11  11  12         Edman Circle, MD 04/10/2023, 1:42 PM  Cc: Lucky Cowboy, MD

## 2023-04-10 NOTE — Patient Instructions (Addendum)
_______________________________________________________  If your blood pressure at your visit was 140/90 or greater, please contact your primary care physician to follow up on this.  _______________________________________________________  If you are age 61 or older, your body mass index should be between 23-30. Your Body mass index is 25.75 kg/m. If this is out of the aforementioned range listed, please consider follow up with your Primary Care Provider.  If you are age 89 or younger, your body mass index should be between 19-25. Your Body mass index is 25.75 kg/m. If this is out of the aformentioned range listed, please consider follow up with your Primary Care Provider.   ________________________________________________________  The  GI providers would like to encourage you to use [facility] to communicate with providers for non-urgent requests or questions.  Due to long hold times on the telephone, sending your provider a message by [facility] may be a faster and more efficient way to get a response.  Please allow 48 business hours for a response.  Please remember that this is for non-urgent requests.  _______________________________________________________  Bonita Quin have been scheduled for an abdominal ultrasound at [facility]iology (1st floor of hospital) on Apr 21, 2023 at 1130am. Please arrive 30 minutes prior to your appointment for registration. Make certain not to have anything to eat or drink midnight prior to your appointment. Should you need to reschedule your appointment, please contact radiology at [phone]. This test typically takes about 30 minutes to perform.  Continue Protonix 2 time a day  We have sent the following medications to your pharmacy for you to pick up at your convenience: Carafate 1 tablet 2 times a day for 14 days  You have been scheduled for an appointment with Dr. Chales Abrahams on 08-06-2023 at 210pm . Please arrive 10 minutes early for your  appointment.   Thank you,  Dr. Lynann Bologna

## 2023-04-21 ENCOUNTER — Ambulatory Visit (HOSPITAL_COMMUNITY)
Admission: RE | Admit: 2023-04-21 | Discharge: 2023-04-21 | Disposition: A | Discharge status: Home / Self Care | Payer: 59 | Source: Ambulatory Visit | Attending: Gastroenterology | Admitting: Gastroenterology

## 2023-04-21 DIAGNOSIS — R1013 Epigastric pain: Secondary | ICD-10-CM | POA: Diagnosis not present

## 2023-04-21 DIAGNOSIS — K219 Gastro-esophageal reflux disease without esophagitis: Secondary | ICD-10-CM | POA: Insufficient documentation

## 2023-05-04 ENCOUNTER — Other Ambulatory Visit: Payer: Self-pay | Admitting: Internal Medicine

## 2023-05-04 DIAGNOSIS — E6609 Other obesity due to excess calories: Secondary | ICD-10-CM

## 2023-05-07 ENCOUNTER — Encounter: Payer: Self-pay | Admitting: Internal Medicine

## 2023-05-07 NOTE — Patient Instructions (Signed)

## 2023-05-07 NOTE — Progress Notes (Signed)
Future Appointments  Date Time Provider Department  05/08/2023                           6 mo ov 10:00 AM Lucky Cowboy, MD [name]  05/29/2023  8:00 AM Genia Del, MD [name]  08/06/2023  2:10 PM Lynann Bologna, MD [facility]  11/20/2023                             cpe 10:00 AM Adela Glimpse, NP [name]    History of Present Illness:       This very nice 62 y.o. MWF with  labile HTN, HLD, Prediabetes, GERD  and Vitamin D Deficiency.        Patient is treated for HTN  circa 2000  & BP has been controlled and  today's BP is at goal -  115/76. Patient has had no complaints of any cardiac type chest pain, palpitations, dyspnea / orthopnea / PND, dizziness, claudication, or dependent edema.       Hyperlipidemia is controlled with diet & Rosuvastatin . Patient denies myalgias or other med SE's. Last Lipids were at goal :  Lab Results  Component Value Date   CHOL 138 02/06/2023   HDL 65 02/06/2023   LDLCALC 57 02/06/2023   TRIG 80 02/06/2023   CHOLHDL 2.1 02/06/2023     Also, the patient has history of T2_NIDDM PreDiabetes and has had no symptoms of reactive hypoglycemia, diabetic polys, paresthesias or visual blurring.  Last A1c was   Lab Results  Component Value Date   HGBA1C 5.8 (H) 10/31/2022                                                          Further, the patient also has history of Vitamin D Deficiency and supplements vitamin D . Last vitamin D was low ( goal = 70-100 ) :   Lab Results  Component Value Date   VD25OH 42 10/31/2022      Current Outpatient Medications on File Prior to Visit  Medication Sig   aspirin 81 MG tablet Take  daily.   Cetirizine HCl (ZYRTEC ALLERGY PO) Take  at bedtime.   CINNAMON PO Take daily   VITAMIN B 12  1,000 mcg Take daily.   escitalopram 20 MG tablet Take 1 tablet  Daily    Ferrous Sulfate  Take  3 times a week.   gabapentin 600 MG tablet Take  1/2 to 1 tablet  2 to 3 x  / Daily as needed for Pain    levothyroxine  50 MCG tablet TAKE ONE TABLET DAILY    Magnesium 400 MG TABS Take 1 tablet  2  times daily.   Melatonin 10 MG TABS Take at bedtime   OMEGA-3 FISH OIL  1200 MG CAPS Take 2 capsules daily.   pantoprazole 40 MG tablet Take  1 tablet  2 x /day (every 12 hours)    phentermine 37.5 MG tablet Take 1/2 to 1 tablet every Morning    rosuvastatin (CRESTOR) 20 MG tablet Take 1 tablet Daily    sucralfate (CARAFATE) 1 g tablet Take 1 tablet  4 times daily.   topiramate 50 MG tablet Take 1/2 to  1 tablet 2 x /day   VITAMIN D  5,000  Units  Take     Allergies  Allergen Reactions   Penicillins Hives   Sulfa Antibiotics Hives     PMHx:   Past Medical History:  Diagnosis Date   Allergy    seasonal   GERD (gastroesophageal reflux disease)    Hyperlipidemia    Labile hypertension    Lyme disease    in the 1990/s   Prediabetes    Raynaud's disease    Thyroid disease    hypothyroid   Vitamin D deficiency      Immunization History  Administered Date(s) Administered   Hepatitis B 06/06/2002, 07/07/2002, 12/29/2002   Influenza Inj Mdck Quad  11/28/2020   Influenza,inj,Quad  10/31/2022   Influenza 10/14/2018   PFIZER -SARS-COV-2 Vacc 02/19/2020, 03/11/2020   PPD Test 07/26/2018, 08/15/2019, 08/30/2020, 09/18/2021, 10/31/2022   Pneumococcal- 23 12/15/1992   Td 01/06/2007   Tdap 07/01/2017   Zoster Recombinat (Shingrix) 03/01/2018, 05/21/2018     Past Surgical History:  Procedure Laterality Date   COLONOSCOPY     FOOT SURGERY  09/28/2020   TENDON REPAIR    TONSILLECTOMY       FHx:    Reviewed / unchanged   SHx:    Reviewed / unchanged    Systems Review:  Constitutional: Denies fever, chills, wt changes, headaches, insomnia, fatigue, night sweats, change in appetite. Eyes: Denies redness, blurred vision, diplopia, discharge, itchy, watery eyes.  ENT: Denies discharge, congestion, post nasal drip, epistaxis, sore throat, earache, hearing loss, dental pain,  tinnitus, vertigo, sinus pain, snoring.  CV: Denies chest pain, palpitations, irregular heartbeat, syncope, dyspnea, diaphoresis, orthopnea, PND, claudication or edema. Respiratory: denies cough, dyspnea, DOE, pleurisy, hoarseness, laryngitis, wheezing.  Gastrointestinal: Denies dysphagia, odynophagia, heartburn, reflux, water brash, abdominal pain or cramps, nausea, vomiting, bloating, diarrhea, constipation, hematemesis, melena, hematochezia  or hemorrhoids. Genitourinary: Denies dysuria, frequency, urgency, nocturia, hesitancy, discharge, hematuria or flank pain. Musculoskeletal: Denies arthralgias, myalgias, stiffness, jt. swelling, pain, limping or strain/sprain.  Skin: Denies pruritus, rash, hives, warts, acne, eczema or change in skin lesion(s). Neuro: No weakness, tremor, incoordination, spasms, paresthesia or pain. Psychiatric: Denies confusion, memory loss or sensory loss. Endo: Denies change in weight, skin or hair change.  Heme/Lymph: No excessive bleeding, bruising or enlarged lymph nodes.   Physical Exam  BP 115/76   Pulse 82   Temp 97.8 F (36.6 C)   Resp 16   Ht 5\' 4"  (1.626 m)   Wt 146 lb 9.6 oz (66.5 kg)   LMP 03/02/2013   SpO2 99%   BMI 25.16 kg/m   Appears  well nourished, well groomed  and in no distress.  Eyes: PERRLA, EOMs, conjunctiva no swelling or erythema. Sinuses: No frontal/maxillary tenderness ENT/Mouth: EAC's clear, TM's nl w/o erythema, bulging. Nares clear w/o erythema, swelling, exudates. Oropharynx clear without erythema or exudates. Oral hygiene is good. Tongue normal, non obstructing. Hearing intact.  Neck: Supple. Thyroid not palpable. Car 2+/2+ without bruits, nodes or JVD. Chest: Respirations nl with BS clear & equal w/o rales, rhonchi, wheezing or stridor.  Cor: Heart sounds normal w/ regular rate and rhythm without sig. murmurs, gallops, clicks or rubs. Peripheral pulses normal and equal  without edema.  Abdomen: Soft & bowel sounds normal.  Non-tender w/o guarding, rebound, hernias, masses or organomegaly.  Lymphatics: Unremarkable.  Musculoskeletal: Full ROM all peripheral extremities, joint stability, 5/5 strength and normal gait.  Skin: Warm, dry without exposed rashes, lesions or ecchymosis apparent.  Neuro: Cranial nerves intact, reflexes equal bilaterally. Sensory-motor testing grossly intact. Tendon reflexes grossly intact.  Pysch: Alert & oriented x 3.  Insight and judgement nl & appropriate. No ideations.   Assessment and Plan:  1. Labile hypertension  - Continue medication, monitor blood pressure at home.  - Continue DASH diet.  Reminder to go to the ER if any CP,  SOB, nausea, dizziness, severe HA, changes vision/speech.   - CBC with Differential/Platelet - COMPLETE METABOLIC PANEL WITH GFR - Magnesium - TSH   2. Hyperlipidemia, mixed  - Continue diet/meds, exercise,& lifestyle modifications.  - Continue monitor periodic cholesterol/liver & renal functions    - Lipid panel - TSH   3. Abnormal glucose  - Continue diet, exercise  - Lifestyle modifications.  - Monitor appropriate labs    - Hemoglobin A1c - Insulin, random   4. Vitamin D deficiency  - Continue supplementation   - VITAMIN D 25 Hydroxy   5. Hypothyroidism  - TSH   6. Gastroesophageal reflux disease without esophagitis  - CBC with Differential/Platelet   7. Medication management  - CBC with Differential/Platelet - COMPLETE METABOLIC PANEL WITH GFR - Magnesium - Lipid panel - TSH - Hemoglobin A1c - Insulin, random - VITAMIN D 25 Hydroxy          Discussed  regular exercise, BP monitoring, weight control to achieve/maintain BMI less than 25 and discussed med and SE's. Recommended labs to assess /monitor clinical status .  I discussed the assessment and treatment plan with the patient. The patient was provided an opportunity to ask questions and all were answered. The patient agreed with the plan and demonstrated  an understanding of the instructions.  I provided over 30 minutes of exam, counseling, chart review and  complex critical decision making.        The patient was advised to call back or seek an in-person evaluation if the symptoms worsen or if the condition fails to improve as anticipated.   Marinus Maw, MD

## 2023-05-08 ENCOUNTER — Ambulatory Visit: Payer: 59 | Admitting: Internal Medicine

## 2023-05-08 ENCOUNTER — Encounter: Payer: Self-pay | Admitting: Internal Medicine

## 2023-05-08 VITALS — BP 115/76 | HR 82 | Temp 97.8°F | Resp 16 | Ht 64.0 in | Wt 146.6 lb

## 2023-05-08 DIAGNOSIS — R7309 Other abnormal glucose: Secondary | ICD-10-CM

## 2023-05-08 DIAGNOSIS — Z79899 Other long term (current) drug therapy: Secondary | ICD-10-CM

## 2023-05-08 DIAGNOSIS — E66811 Obesity, class 1: Secondary | ICD-10-CM

## 2023-05-08 DIAGNOSIS — R0989 Other specified symptoms and signs involving the circulatory and respiratory systems: Secondary | ICD-10-CM | POA: Diagnosis not present

## 2023-05-08 DIAGNOSIS — E039 Hypothyroidism, unspecified: Secondary | ICD-10-CM

## 2023-05-08 DIAGNOSIS — E782 Mixed hyperlipidemia: Secondary | ICD-10-CM | POA: Diagnosis not present

## 2023-05-08 DIAGNOSIS — E6609 Other obesity due to excess calories: Secondary | ICD-10-CM

## 2023-05-08 DIAGNOSIS — E559 Vitamin D deficiency, unspecified: Secondary | ICD-10-CM

## 2023-05-08 DIAGNOSIS — Z6830 Body mass index (BMI) 30.0-30.9, adult: Secondary | ICD-10-CM

## 2023-05-08 DIAGNOSIS — K219 Gastro-esophageal reflux disease without esophagitis: Secondary | ICD-10-CM

## 2023-05-08 LAB — CBC WITH DIFFERENTIAL/PLATELET
HCT: 41.9 % (ref 35.0–45.0)
Hemoglobin: 13.2 g/dL (ref 11.7–15.5)
Lymphs Abs: 1219 cells/uL (ref 850–3900)
Monocytes Relative: 10.6 %
RDW: 12.2 % (ref 11.0–15.0)
WBC: 4.4 10*3/uL (ref 3.8–10.8)

## 2023-05-09 LAB — COMPLETE METABOLIC PANEL WITH GFR
Albumin: 4.4 g/dL (ref 3.6–5.1)
CO2: 29 mmol/L (ref 20–32)
Creat: 1.07 mg/dL — ABNORMAL HIGH (ref 0.50–1.05)
Globulin: 2.6 g/dL (calc) (ref 1.9–3.7)
Sodium: 142 mmol/L (ref 135–146)
eGFR: 59 mL/min/{1.73_m2} — ABNORMAL LOW (ref >=60)

## 2023-05-09 LAB — LIPID PANEL
LDL Cholesterol (Calc): 67 mg/dL (calc)
Non-HDL Cholesterol (Calc): 81 mg/dL (calc) (ref <130)
Total CHOL/HDL Ratio: 2.2 (calc) (ref <5.0)
Triglycerides: 67 mg/dL (ref <150)

## 2023-05-09 LAB — HEMOGLOBIN A1C: Mean Plasma Glucose: 123 mg/dL

## 2023-05-09 LAB — CBC WITH DIFFERENTIAL/PLATELET
MCH: 28.3 pg (ref 27.0–33.0)
MCHC: 31.5 g/dL — ABNORMAL LOW (ref 32.0–36.0)
MPV: 10.4 fL (ref 7.5–12.5)
Platelets: 255 10*3/uL (ref 140–400)

## 2023-05-09 LAB — TSH: TSH: 1.91 mIU/L (ref 0.40–4.50)

## 2023-05-09 MED ORDER — PHENTERMINE HCL 37.5 MG PO TABS
ORAL_TABLET | ORAL | 1 refills | Status: DC
Start: 2023-05-09 — End: 2023-12-19

## 2023-05-09 NOTE — Progress Notes (Signed)
^<^<^<^<^<^<^<^<^<^<^<^<^<^<^<^<^<^<^<^<^<^<^<^<^<^<^<^<^<^<^<^<^<^<^<^<^ ^>^>^>^>^>^>^>^>^>^>^>>^>^>^>^>^>^>^>^>^>^>^>^>^>^>^>^>^>^>^>^>^>^>^>^>^>  -  Test results slightly outside the reference range are not unusual. If there is anything important, I will review this with you,  otherwise it is considered normal test values.  If you have further questions,  please do not hesitate to contact me at the office or via My Chart.   ^<^<^<^<^<^<^<^<^<^<^<^<^<^<^<^<^<^<^<^<^<^<^<^<^<^<^<^<^<^<^<^<^<^<^<^<^ ^>^>^>^>^>^>^>^>^>^>^>^>^>^>^>^>^>^>^>^>^>^>^>^>^>^>^>^>^>^>^>^>^>^>^>^>^  -  A1c = 5.9%  Blood sugar and A1c are  STILL elevated in the borderline and  early or pre-diabetes range which has the same   300% increased risk for heart attack, stroke, cancer and                                       alzheimer- type vascular dementia as full blown diabetes.   But the good news is that diet, exercise with                                                weight loss can cure the early diabetes at this point.  ^<^<^<^<^<^<^<^<^<^<^<^<^<^<^<^<^<^<^<^<^<^<^<^<^<^<^<^<^<^<^<^<^<^<^<^<^ ^>^>^>^>^>^>^>^>^>^>^>^>^>^>^>^>^>^>^>^>^>^>^>^>^>^>^>^>^>^>^>^>^>^>^>^>^  -  Vitamin D = 65  - Great  !     Please continue dosage  Same   ^<^<^<^<^<^<^<^<^<^<^<^<^<^<^<^<^<^<^<^<^<^<^<^<^<^<^<^<^<^<^<^<^<^<^<^<^ ^>^>^>^>^>^>^>^>^>^>^>^>^>^>^>^>^>^>^>^>^>^>^>^>^>^>^>^>^>^>^>^>^>^>^>^>^  - Chol = 150  -    Excellent   - Very low risk for Heart Attack  / Stroke  ^<^<^<^<^<^<^<^<^<^<^<^<^<^<^<^<^<^<^<^<^<^<^<^<^<^<^<^<^<^<^<^<^<^<^<^<^ ^>^>^>^>^>^>^>^>^>^>^>^>^>^>^>^>^>^>^>^>^>^>^>^>^>^>^>^>^>^>^>^>^>^>^>^>^  -  All Else - CBC - Kidneys - Electrolytes - Liver - Magnesium & Thyroid    - all  Normal / OK  ^<^<^<^<^<^<^<^<^<^<^<^<^<^<^<^<^<^<^<^<^<^<^<^<^<^<^<^<^<^<^<^<^<^<^<^<^ ^>^>^>^>^>^>^>^>^>^>^>^>^>^>^>^>^>^>^>^>^>^>^>^>^>^>^>^>^>^>^>^>^>^>^>^>^

## 2023-05-11 LAB — COMPLETE METABOLIC PANEL WITH GFR
AG Ratio: 1.7 (calc) (ref 1.0–2.5)
ALT: 13 U/L (ref 6–29)
AST: 17 U/L (ref 10–35)
Alkaline phosphatase (APISO): 56 U/L (ref 37–153)
BUN/Creatinine Ratio: 11 (calc) (ref 6–22)
BUN: 12 mg/dL (ref 7–25)
Calcium: 9.4 mg/dL (ref 8.6–10.4)
Chloride: 106 mmol/L (ref 98–110)
Glucose, Bld: 85 mg/dL (ref 65–99)
Potassium: 4.7 mmol/L (ref 3.5–5.3)
Total Bilirubin: 0.3 mg/dL (ref 0.2–1.2)
Total Protein: 7 g/dL (ref 6.1–8.1)

## 2023-05-11 LAB — LIPID PANEL
Cholesterol: 150 mg/dL (ref <200)
HDL: 69 mg/dL (ref >=50)

## 2023-05-11 LAB — INSULIN, RANDOM: Insulin: 3.4 u[IU]/mL

## 2023-05-11 LAB — CBC WITH DIFFERENTIAL/PLATELET
Absolute Monocytes: 466 cells/uL (ref 200–950)
Basophils Absolute: 40 cells/uL (ref 0–200)
Basophils Relative: 0.9 %
Eosinophils Absolute: 158 cells/uL (ref 15–500)
Eosinophils Relative: 3.6 %
MCV: 89.9 fL (ref 80.0–100.0)
Neutro Abs: 2517 cells/uL (ref 1500–7800)
Neutrophils Relative %: 57.2 %
RBC: 4.66 10*6/uL (ref 3.80–5.10)
Total Lymphocyte: 27.7 %

## 2023-05-11 LAB — VITAMIN D 25 HYDROXY (VIT D DEFICIENCY, FRACTURES): Vit D, 25-Hydroxy: 65 ng/mL (ref 30–100)

## 2023-05-11 LAB — MAGNESIUM: Magnesium: 2.2 mg/dL (ref 1.5–2.5)

## 2023-05-11 LAB — HEMOGLOBIN A1C
Hgb A1c MFr Bld: 5.9 % of total Hgb — ABNORMAL HIGH (ref <5.7)
eAG (mmol/L): 6.8 mmol/L

## 2023-05-29 ENCOUNTER — Ambulatory Visit (INDEPENDENT_AMBULATORY_CARE_PROVIDER_SITE_OTHER): Payer: 59 | Admitting: Obstetrics & Gynecology

## 2023-05-29 ENCOUNTER — Encounter: Payer: Self-pay | Admitting: Obstetrics & Gynecology

## 2023-05-29 VITALS — BP 132/78 | HR 71 | Resp 20 | Ht 63.39 in | Wt 145.8 lb

## 2023-05-29 DIAGNOSIS — Z780 Asymptomatic menopausal state: Secondary | ICD-10-CM | POA: Diagnosis not present

## 2023-05-29 DIAGNOSIS — Z01419 Encounter for gynecological examination (general) (routine) without abnormal findings: Secondary | ICD-10-CM

## 2023-05-29 DIAGNOSIS — M8589 Other specified disorders of bone density and structure, multiple sites: Secondary | ICD-10-CM | POA: Diagnosis not present

## 2023-05-29 NOTE — Progress Notes (Signed)
Beth Miranda 06-04-1961 [id number]   History:    62 y.o.  G0 Married.  Mother died in 04-13-20.   RP:  Established patient presenting for annual gyn exam    HPI:  Postmenopause, well on no HRT x last year. No PMB. Rare hot flushes.  No pelvic pain. Abstinent. Pap neg 05/2022.  No h/o abnormal Pap.  Pap to repeat at 3 years.  Breasts normal.  Screening mammo Rt Neg, Left Dx/US Benign 10/2022.  Urine/BMs normal.  BMI 25.51. 3rd foot surgery last year.  Good nutrition. Health labs with Fam MD.  Alen Bleacher 05/2022. Bone Density 05/2020 very mild osteopenia.  Will repeat BD at 5 years.    Past medical history,surgical history, family history and social history were all reviewed and documented in the EPIC chart.  Gynecologic History Patient's last menstrual period was 03/02/2013.  Obstetric History OB History  Gravida Para Term Preterm AB Living  0 0 0 0 0 0  SAB IAB Ectopic Multiple Live Births  0 0 0 0       ROS: A ROS was performed and pertinent positives and negatives are included in the history. GENERAL: No fevers or chills. HEENT: No change in vision, no earache, sore throat or sinus congestion. NECK: No pain or stiffness. CARDIOVASCULAR: No chest pain or pressure. No palpitations. PULMONARY: No shortness of breath, cough or wheeze. GASTROINTESTINAL: No abdominal pain, nausea, vomiting or diarrhea, melena or bright red blood per rectum. GENITOURINARY: No urinary frequency, urgency, hesitancy or dysuria. MUSCULOSKELETAL: No joint or muscle pain, no back pain, no recent trauma. DERMATOLOGIC: No rash, no itching, no lesions. ENDOCRINE: No polyuria, polydipsia, no heat or cold intolerance. No recent change in weight. HEMATOLOGICAL: No anemia or easy bruising or bleeding. NEUROLOGIC: No headache, seizures, numbness, tingling or weakness. PSYCHIATRIC: No depression, no loss of interest in normal activity or change in sleep pattern.     Exam:   BP 132/78 (BP Location: Right Arm, Patient  Position: Sitting)   Pulse 71   Resp 20   Ht 5' 3.39" (1.61 m)   Wt 145 lb 12.8 oz (66.1 kg)   LMP 03/02/2013   SpO2 97%   BMI 25.51 kg/m   Body mass index is 25.51 kg/m.  General appearance : Well developed well nourished female. No acute distress HEENT: Eyes: no retinal hemorrhage or exudates,  Neck supple, trachea midline, no carotid bruits, no thyroidmegaly Lungs: Clear to auscultation, no rhonchi or wheezes, or rib retractions  Heart: Regular rate and rhythm, no murmurs or gallops Breast:Examined in sitting and supine position were symmetrical in appearance, no palpable masses or tenderness,  no skin retraction, no nipple inversion, no nipple discharge, no skin discoloration, no axillary or supraclavicular lymphadenopathy Abdomen: no palpable masses or tenderness, no rebound or guarding Extremities: no edema or skin discoloration or tenderness  Pelvic: Vulva: Normal             Vagina: No gross lesions or discharge  Cervix: No gross lesions or discharge  Uterus  AV, normal size, shape and consistency, non-tender and mobile  Adnexa  Without masses or tenderness  Anus: Normal   Assessment/Plan:  62 y.o. female for annual exam   1. Well female exam with routine gynecological exam Postmenopause, well on no HRT x last year. No PMB. Rare hot flushes.  No pelvic pain. Abstinent. Pap neg 05/2022.  No h/o abnormal Pap.  Pap to repeat at 3 years.  Breasts normal.  Screening mammo Rt Neg,  Left Dx/US Benign 10/2022.  Urine/BMs normal.  BMI 25.51. 3rd foot surgery last year.  Good nutrition. Health labs with Fam MD.  Alen Bleacher 05/2022. Bone Density 05/2020 very mild osteopenia.  Will repeat BD at 5 years.    2. Postmenopause Postmenopause, well on no HRT x last year. No PMB. Rare hot flushes.  No pelvic pain. Abstinent.  3. Osteopenia of multiple sites  Bone Density 05/2020 very mild osteopenia -1.3 at Bilateral Femoral Necks.  Will repeat BD at 5 years.    Genia Del MD, 8:22 AM

## 2023-06-02 ENCOUNTER — Other Ambulatory Visit: Payer: Self-pay | Admitting: Internal Medicine

## 2023-06-02 DIAGNOSIS — E039 Hypothyroidism, unspecified: Secondary | ICD-10-CM

## 2023-06-02 MED ORDER — LEVOTHYROXINE SODIUM 50 MCG PO TABS: ORAL_TABLET | ORAL | 3 refills | Status: DC

## 2023-07-13 ENCOUNTER — Other Ambulatory Visit: Payer: Self-pay

## 2023-07-13 ENCOUNTER — Encounter: Payer: Self-pay | Admitting: Gastroenterology

## 2023-07-13 DIAGNOSIS — K219 Gastro-esophageal reflux disease without esophagitis: Secondary | ICD-10-CM

## 2023-07-13 MED ORDER — PANTOPRAZOLE SODIUM 40 MG PO TBEC
40.0000 mg | DELAYED_RELEASE_TABLET | Freq: Two times a day (BID) | ORAL | 3 refills | Status: DC
Start: 2023-07-13 — End: 2023-11-23

## 2023-07-13 NOTE — Telephone Encounter (Signed)
Pt chart was reviewed and noted Protonix 40 mg BID per recent office note.  Prescription sent to pharmacy. Pt made aware through my chart.

## 2023-08-06 ENCOUNTER — Encounter: Payer: Self-pay | Admitting: Gastroenterology

## 2023-08-06 ENCOUNTER — Ambulatory Visit: Payer: 59 | Admitting: Gastroenterology

## 2023-08-06 VITALS — BP 122/70 | HR 74 | Ht 64.0 in | Wt 146.1 lb

## 2023-08-06 DIAGNOSIS — R1013 Epigastric pain: Secondary | ICD-10-CM | POA: Diagnosis not present

## 2023-08-06 DIAGNOSIS — K219 Gastro-esophageal reflux disease without esophagitis: Secondary | ICD-10-CM | POA: Diagnosis not present

## 2023-08-06 NOTE — Progress Notes (Signed)
Chief Complaint: For GERD  Referring Provider:  Lucky Cowboy, MD      ASSESSMENT AND PLAN;   #1. GERD with nocturnal symptoms. #2. Epi pain (mild). Neg [place] 04/2023   Plan: -Continue protonix 40 BID.  -If with problems, GES to r/o gastroparesis. -Nonpharmacologic means of reflux control. -She wants to hold off on manometry/TIF eval currently. -FU in 1 year  Discussed long-term side effects of PPIs.  She does understand. HPI:    Beth Miranda is a 62 y.o. female  With Mixed Connective disease/Raynaud's, HTN, hypothyroidism  For FU  Doing great on Protonix 40 twice daily.  She will occasionally miss a dose or so. Rare breakthrough symptoms, well-controlled with Tums as needed.   She would like to continue current medications and does not want to reduce Protonix to daily.  She is off Carafate  EGD Jan 2024 revealed 2 cm HH without esophagitis.  Eso biopsies positive for mild reflux.  Denies having any further nocturnal symptoms.  No regurgitation.  She denies having any odynophagia or dysphagia.  Mild epigastric discomfort/pain.  More problems when she bends. Neg [place] for cholelithiasis  Has also been able to lose weight which helps.   From previous notes: C/O GERD x yrs -mostly heartburn and regurgitation (worst with onions, tomato sauce) Somewhat better with protonix 40 BID, takes TUMS prn Has been sleeping on a recliner as she would have significant nocturnal symptoms.  She is definitely somewhat better since Protonix has been increased to twice daily.  No odynophagia or dysphagia.  No fever chills or night sweats.  No melena or hematochezia.  No significant abdominal pain.  Wt Readings from Last 3 Encounters:  08/06/23 146 lb 2 oz (66.3 kg)  05/29/23 145 lb 12.8 oz (66.1 kg)  05/08/23 146 lb 9.6 oz (66.5 kg)   Previous GI workup: [place] 04/2023 1. No cholelithiasis or sonographic evidence for acute cholecystitis. 2. Mildly increased hepatic parenchymal  echogenicity suggestive of steatosis.  EGD 01/12/2023 - Mildly torturous esophagus.  - 2 cm hiatal hernia. - Neg Bx for HO/celiac/EoE. Eso bx did show mild reflux  Colon 06/06/2022 - Mild predominantly sigmoid diverticulosis - Non-bleeding internal hemorrhoids. - The examined portion of the ileum was normal. - The examination was otherwise normal on direct and retroflexion views. - Rpt in 10 yrs. Earlier, if with any new problems or change in family history.  Past Medical History:  Diagnosis Date   Allergy    seasonal   GERD (gastroesophageal reflux disease)    Hyperlipidemia    Labile hypertension    Lyme disease    in the 1990/s   Prediabetes    Raynaud's disease    Thyroid disease    hypothyroid   Vitamin D deficiency     Past Surgical History:  Procedure Laterality Date   COLONOSCOPY     FOOT SURGERY  09/28/2020   TENDON REPAIR    TONSILLECTOMY      Family History  Problem Relation Age of Onset   Arthritis Mother        Living   COPD Mother    Asthma Mother    Hypertension Father        Living   Diabetes Father    Kidney disease Father        Stones   Hyperlipidemia Father    Other Brother        Deceased   Pancreatic cancer Paternal Grandmother    Breast cancer Neg Hx  Colon cancer Neg Hx    Esophageal cancer Neg Hx    Rectal cancer Neg Hx    Stomach cancer Neg Hx     Social History   Tobacco Use   Smoking status: Former    Types: Cigarettes   Smokeless tobacco: Never  Vaping Use   Vaping status: Never Used  Substance Use Topics   Alcohol use: Yes    Comment: occasionally   Drug use: No    Current Outpatient Medications  Medication Sig Dispense Refill   aspirin 81 MG tablet Take 81 mg by mouth daily.     Cetirizine HCl (ZYRTEC ALLERGY PO) Take 10 mg by mouth at bedtime.     CINNAMON PO Take by mouth.     Cyanocobalamin (VITAMIN B 12 PO) Take 1,000 mcg by mouth daily.     escitalopram (LEXAPRO) 20 MG tablet Take 1 tablet  Daily  for  Mood                                                            /                                  TAKE                                     BY                                     MOUTH                                 ONCE DAILY 90 tablet 3   Ferrous Sulfate (IRON PO) Take by mouth 3 (three) times a week.     gabapentin (NEURONTIN) 600 MG tablet Take  1/2 to 1 tablet  2 to 3 x  / Daily as needed for Pain 270 tablet 1   levothyroxine (SYNTHROID) 50 MCG tablet Take  1 tablet  Daily  on an empty stomach with only water for 30 minutes & no Antacid meds, Calcium or Magnesium for 4 hours & avoid Biotin                                                                                            /  TAKE                                         BY                                                 MOUTH                           ONCE  ?  ?  ?  ?   DAILY 90 tablet 3   Magnesium 400 MG TABS Take 1 tablet by mouth 2 (two) times daily.     Omega-3 Fatty Acids (OMEGA-3 FISH OIL) 1200 MG CAPS Take 2 capsules by mouth daily.     pantoprazole (PROTONIX) 40 MG tablet Take 1 tablet (40 mg total) by mouth 2 (two) times daily. 90 tablet 3   phentermine (ADIPEX-P) 37.5 MG tablet Take  1/2 to 1 tablet  every Morning  for Dieting & Weight Loss 90 tablet 1   rosuvastatin (CRESTOR) 20 MG tablet Take 1 tablet Daily for Cholesterol 90 tablet 3   topiramate (TOPAMAX) 50 MG tablet Take 1/2 to 1 tablet 2 x /day at Suppertime & Bedtime for Dieting & Weight Loss 180 tablet 1   VITAMIN D PO Take 5,000 Int'l Units by mouth.     No current facility-administered medications for this visit.    Allergies  Allergen Reactions   Penicillins Hives   Sulfa Antibiotics Hives    Review of Systems:  neg     Physical Exam:    BP 122/70   Ht 5\' 4"  (1.626 m)   Wt 146 lb 2 oz (66.3 kg)   LMP 03/02/2013   BMI 25.08 kg/m  Wt Readings from Last 3 Encounters:  08/06/23 146 lb 2  oz (66.3 kg)  05/29/23 145 lb 12.8 oz (66.1 kg)  05/08/23 146 lb 9.6 oz (66.5 kg)   Constitutional:  Well-developed, in no acute distress. Psychiatric: Normal mood and affect. Behavior is normal. HEENT: Pupils normal.  Conjunctivae are normal. No scleral icterus. Cardiovascular: Normal rate, regular rhythm. No edema Pulmonary/chest: Effort normal and breath sounds normal. No wheezing, rales or rhonchi. Abdominal: Soft, nondistended. Nontender. Bowel sounds active throughout. There are no masses palpable. No hepatomegaly. Rectal: Deferred Neurological: Alert and oriented to person place and time. Skin: Skin is warm and dry. No rashes noted.  Data Reviewed: I have personally reviewed following labs and imaging studies  CBC:    Latest Ref Rng & Units 05/08/2023   10:51 AM 02/06/2023   10:07 AM 10/31/2022   12:00 AM  CBC  WBC 3.8 - 10.8 Thousand/uL 4.4  3.6  4.4   Hemoglobin 11.7 - 15.5 g/dL 16.1  09.6  04.5   Hematocrit 35.0 - 45.0 % 41.9  40.1  42.1   Platelets 140 - 400 Thousand/uL 255  227  250     CMP:    Latest Ref Rng & Units 05/08/2023   10:51 AM 02/06/2023   10:07 AM 10/31/2022   12:00 AM  CMP  Glucose 65 - 99 mg/dL 85  86  86   BUN 7 - 25 mg/dL 12  11  15    Creatinine 0.50 - 1.05 mg/dL 4.09  0.86  0.81   Sodium 135 - 146 mmol/L 142  145  143   Potassium 3.5 - 5.3 mmol/L 4.7  4.2  4.8   Chloride 98 - 110 mmol/L 106  109  105   CO2 20 - 32 mmol/L 29  28  32   Calcium 8.6 - 10.4 mg/dL 9.4  16.1  09.6   Total Protein 6.1 - 8.1 g/dL 7.0  6.9  7.3   Total Bilirubin 0.2 - 1.2 mg/dL 0.3  0.4  0.4   AST 10 - 35 U/L 17  16  18    ALT 6 - 29 U/L 13  11  11         Edman Circle, MD 08/06/2023, 2:44 PM  Cc: Lucky Cowboy, MD

## 2023-08-06 NOTE — Patient Instructions (Addendum)
_______________________________________________________  If your blood pressure at your visit was 140/90 or greater, please contact your primary care physician to follow up on this.  _______________________________________________________  If you are age 61 or older, your body mass index should be between 23-30. Your Body mass index is 25.08 kg/m. If this is out of the aforementioned range listed, please consider follow up with your Primary Care Provider.  If you are age 81 or younger, your body mass index should be between 19-25. Your Body mass index is 25.08 kg/m. If this is out of the aformentioned range listed, please consider follow up with your Primary Care Provider.   ________________________________________________________  The [facility] providers would like to encourage you to use [facility] to communicate with providers for non-urgent requests or questions.  Due to long hold times on the telephone, sending your provider a message by [facility] may be a faster and more efficient way to get a response.  Please allow 48 business hours for a response.  Please remember that this is for non-urgent requests.  _______________________________________________________  Continue Protonix 2 times a day  Please follow up in 12 months. Give [place] a call at [phone] to schedule an appointment.  Thank you,  Dr. Lynann Bologna

## 2023-08-14 ENCOUNTER — Ambulatory Visit: Payer: 59 | Admitting: Nurse Practitioner

## 2023-08-14 ENCOUNTER — Encounter: Payer: Self-pay | Admitting: Nurse Practitioner

## 2023-08-14 VITALS — BP 126/74 | HR 81 | Temp 98.0°F | Ht 64.0 in | Wt 145.8 lb

## 2023-08-14 DIAGNOSIS — E669 Obesity, unspecified: Secondary | ICD-10-CM

## 2023-08-14 DIAGNOSIS — R0989 Other specified symptoms and signs involving the circulatory and respiratory systems: Secondary | ICD-10-CM | POA: Diagnosis not present

## 2023-08-14 DIAGNOSIS — E782 Mixed hyperlipidemia: Secondary | ICD-10-CM

## 2023-08-14 DIAGNOSIS — E039 Hypothyroidism, unspecified: Secondary | ICD-10-CM | POA: Diagnosis not present

## 2023-08-14 DIAGNOSIS — S93491A Sprain of other ligament of right ankle, initial encounter: Secondary | ICD-10-CM

## 2023-08-14 DIAGNOSIS — Z79899 Other long term (current) drug therapy: Secondary | ICD-10-CM

## 2023-08-14 DIAGNOSIS — E559 Vitamin D deficiency, unspecified: Secondary | ICD-10-CM

## 2023-08-14 DIAGNOSIS — N179 Acute kidney failure, unspecified: Secondary | ICD-10-CM

## 2023-08-14 DIAGNOSIS — R7309 Other abnormal glucose: Secondary | ICD-10-CM

## 2023-08-14 DIAGNOSIS — F321 Major depressive disorder, single episode, moderate: Secondary | ICD-10-CM

## 2023-08-14 DIAGNOSIS — K219 Gastro-esophageal reflux disease without esophagitis: Secondary | ICD-10-CM

## 2023-08-14 DIAGNOSIS — G4700 Insomnia, unspecified: Secondary | ICD-10-CM

## 2023-08-14 NOTE — Patient Instructions (Signed)
RICE Therapy for Routine Care of Injuries Many injuries can be cared for with rest, ice, compression, and elevation (RICE therapy). This includes: Resting the injured body part. Putting ice on the injury. Putting pressure (compression) on the injury. Raising the injured part (elevation). Using RICE therapy can help to lessen pain and swelling. Supplies needed: Ice. Plastic bag. Towel. Elastic bandage. Pillow or pillows to raise your injured body part. How to care for your injury with RICE therapy Rest Try to rest the injured part of your body. You can go back to your normal activities when your doctor says it is okay to do them and when you can do them without pain. If you rest the injury too much, it may not heal as well. Some injuries heal better with early movement instead of resting for too long. Ask your doctor if you should do exercises to help your injury get better. Ice  If told, put ice on the injured area. To do this: Put ice in a plastic bag. Place a towel between your skin and the bag. Leave the ice on for 20 minutes, 2-3 times a day. Take off the ice if your skin turns bright red. This is very important. If you cannot feel pain, heat, or cold, you have a greater risk of damage to the area. Do not put ice on your bare skin. Use ice for as many days as your doctor tells you to use it. Compression Put pressure on the injured area. This can be done with an elastic bandage. If this type of bandage has been put on your injury: Follow instructions on the package the bandage came in about how to use it. Do not wrap the bandage too tightly. Wrap the bandage more loosely if part of your body beyond the bandage is blue, swollen, cold, painful, or loses feeling. Take off the bandage and put it on again every 3-4 hours or as told by your doctor. See your doctor if the bandage seems to make your problems worse.  Elevation Raise the injured area above the level of your heart while you  are sitting or lying down. Follow these instructions at home: If your symptoms get worse or last a long time, make a follow-up appointment with your doctor. You may need to have imaging tests, such as X-rays or an MRI. If you have imaging tests, ask how to get your results when they are ready. Return to your normal activities when your doctor says that it is safe. Keep all follow-up visits. Contact a doctor if: You keep having pain and swelling. Your symptoms get worse. Get help right away if: You have sudden, very bad pain at your injury or lower than your injury. You have redness or more swelling around your injury. You have tingling or numbness at your injury or lower than your injury, and it does not go away when you take off the bandage. Summary Many injuries can be cared for using rest, ice, compression, and elevation (RICE therapy). You can go back to your normal activities when your doctor says it is okay and when you can do them without pain. Put ice on the injured area as told by your doctor. Get help if your symptoms get worse or if you keep having pain and swelling. This information is not intended to replace advice given to you by your health care provider. Make sure you discuss any questions you have with your health care provider. Document Revised: 09/20/2020 Document Reviewed: 09/20/2020  Elsevier Patient Education  2024 [employer]  

## 2023-08-14 NOTE — Progress Notes (Signed)
3 MONTH FOLLOW UP  Assessment and Plan:  Hypertension Controlled - slight decline in GFR last OV. Discussed DASH (Dietary Approaches to Stop Hypertension) DASH diet is lower in sodium than a typical American diet. Cut back on foods that are high in saturated fat, cholesterol, and trans fats. Eat more whole-grain foods, fish, poultry, and nuts Remain active and exercise as tolerated daily.  Monitor BP at home-Call if greater than 130/80.  Check CMP  Cholesterol Controlled Continue Rosuvastatin Discussed lifestyle modifications. Recommended diet heavy in fruits and veggies, omega 3's. Decrease consumption of animal meats, cheeses, and dairy products. Remain active and exercise as tolerated. Continue to monitor.  Abnormal glucose Education: Reviewed 'ABCs' of diabetes management  Discussed goals to be met and/or maintained include A1C (<7) Blood pressure (<130/80) Cholesterol (LDL <70) Continue Eye Exam yearly  Continue Dental Exam Q6 mo Discussed dietary recommendations Discussed Physical Activity recommendations Monitor A1C  Hypothyroidism Controlled. Continue Levothyroxine. Reminded to take on an empty stomach 30-2mins before food.  Stop any Biotin Supplement 48-72 hours before next TSH level to reduce the risk of falsely low TSH levels. Continue to monitor.    Obesity Continue Phentermine and Topamax Discussed appropriate BMI Diet modification. Physical activity. Encouraged/praised to build confidence.  Vitamin D deficiency Continue supplement for goal of 60-100 Monitor Vitamin D levels  GERD Following GI Dr. Chales Abrahams Continue Protonix and add TUMS d/t increase in NSAID Aleve since right ankle sprain. No suspected reflux complications (Barret/stricture). Lifestyle modification:  wt loss, avoid meals 2-3h before bedtime. Consider eliminating food triggers:  chocolate, caffeine, EtOH, acid/spicy food.  Major depression with anxiety (HCC) Continue Lexapro 20  mg Reviewed relaxation techniques.  Sleep hygiene. Recommended Cognitive Behavioral Therapy (CBT) PRN. Recommended mindfulness meditation and exercise.   Insight-oriented psychotherapy given for 16 minutes exclusively. Psychoeducation:  encouraged personality growth wand development through coping techniques and problem-solving skills. Limit/Decrease/Monitor drug/alcohol intake.    Insomnia, unspecified type Continue Gabapentin Discussed good sleep hygiene. Establish bed and wake times. Sleep restriction-only sleep estimated hrs sleep. Bed only for sex and sleep, only sleep when sleepy, out of bed if anxious (stimulus control). Reviewed relaxation techniques, mindful meditations. Expected sleep duration. Addressed worries about not sleeping.   Medication Management All medications discussed and reviewed in full. All questions and concerns regarding medications addressed.    AKI Continue bASA, statin Discussed how what you eat and drink can aide in kidney protection. Stay well hydrated. Avoid high salt foods. Avoid NSAIDS. Keep BP and BG well controlled.   Take medications as prescribed. Remain active and exercise as tolerated daily. Maintain weight.  Continue to monitor. Check CMP/GFR  Right ankle sprain RICE Method Continue NSAID PRN and topical Voltaren Gel Continue to monitor  Orders Placed This Encounter  Procedures   COMPLETE METABOLIC PANEL WITH GFR   Hemoglobin A1c   TSH   Notify office for further evaluation and treatment, questions or concerns if any reported s/s fail to improve.   The patient was advised to call back or seek an in-person evaluation if any symptoms worsen or if the condition fails to improve as anticipated.   Further disposition pending results of labs. Discussed med's effects and SE's.    I discussed the assessment and treatment plan with the patient. The patient was provided an opportunity to ask questions and all were answered. The  patient agreed with the plan and demonstrated an understanding of the instructions.  Discussed med's effects and SE's. Screening labs and tests as requested  with regular follow-up as recommended.  I provided 30 minutes of face-to-face time during this encounter including counseling, chart review, and critical decision making was preformed.  Today's Plan of Care is based on a patient-centered health care approach known as shared decision making - the decisions, tests and treatments allow for patient preferences and values to be balanced with clinical evidence.      Future Appointments  Date Time Provider Department Center  11/20/2023 10:00 AM Adela Glimpse, NP [name]    ------------------------------------------------------------------------------------------------------------------   HPI BP 126/74   Pulse 81   Temp 98 F (36.7 C)   Ht 5\' 4"  (1.626 m)   Wt 145 lb 12.8 oz (66.1 kg)   LMP 03/02/2013   SpO2 99%   BMI 25.03 kg/m   62 y.o.female presents for 3 month follow up of hyperlipidemia, hypothyroidism, abnormal glucose, Vit D deficiency, GERD, abnormal glucose, depression  States that she fell in a hole while walking to her garden x1 mo ago.  She rolled her right ankle. Continues to have mild edema and pain with dorsa flexion, walking down steps.  Pian feels like a twinge.  Intermittent.  Has FROM, no erythema or bruising.  Has been taking Aleve with some effectiveness.    She is continuing to take Gabapentin and Melatonin at night so has been falling asleep ok.  She has been staying asleep much better. She tried Trazodone in the past but did not help with sleep.  She does have reflux and hiatal hernia, currently on Protonix, carafate. Had endoscopy with Dr. Chales Abrahams and has started the carafate and is doing better.   BMI is Body mass index is 25.03 kg/m., she has been working on diet and exercise. She has lost 18 pounds in the past 2 months. She is on Phentermine and  Topamax without side effects.  Wt Readings from Last 3 Encounters:  08/14/23 145 lb 12.8 oz (66.1 kg)  08/06/23 146 lb 2 oz (66.3 kg)  05/29/23 145 lb 12.8 oz (66.1 kg)   Her blood pressure has been controlled at home, today their BP is BP: 126/74 BP Readings from Last 3 Encounters:  08/14/23 126/74  08/06/23 122/70  05/29/23 132/78     She does workout. She denies chest pain, shortness of breath, dizziness.  She is on cholesterol medication, Rosuvastatin 20 mg daily and denies myalgias. Her cholesterol is not at goal. The cholesterol last visit was:   Lab Results  Component Value Date   CHOL 150 05/08/2023   HDL 69 05/08/2023   LDLCALC 67 05/08/2023   TRIG 67 05/08/2023   CHOLHDL 2.2 05/08/2023    She has been working on diet and exercise for glucose management, and denies increased appetite, paresthesia of the feet, polydipsia, polyuria, visual disturbances and vomiting. Last A1C in the office was:  Lab Results  Component Value Date   HGBA1C 5.9 (H) 05/08/2023   Patient is on Vitamin D supplement.   Lab Results  Component Value Date   VD25OH 65 05/08/2023     Decline in kidney fx 04/2023.  Possibly d/t dehydration.  Last GFR:  Lab Results  Component Value Date   EGFR 59 (L) 05/08/2023    Past Medical History:  Diagnosis Date   Allergy    seasonal   GERD (gastroesophageal reflux disease)    Hyperlipidemia    Labile hypertension    Lyme disease    in the 1990/s   Prediabetes    Raynaud's disease  Thyroid disease    hypothyroid   Vitamin D deficiency      Allergies  Allergen Reactions   Penicillins Hives   Sulfa Antibiotics Hives    Current Outpatient Medications on File Prior to Visit  Medication Sig   aspirin 81 MG tablet Take 81 mg by mouth daily.   Cetirizine HCl (ZYRTEC ALLERGY PO) Take 10 mg by mouth at bedtime.   CINNAMON PO Take by mouth.   Cyanocobalamin (VITAMIN B 12 PO) Take 1,000 mcg by mouth daily.   escitalopram (LEXAPRO) 20 MG tablet  Take 1 tablet  Daily  for Mood                                                            /                                  TAKE                                     BY                                     MOUTH                                 ONCE DAILY   Ferrous Sulfate (IRON PO) Take by mouth 3 (three) times a week.   gabapentin (NEURONTIN) 600 MG tablet Take  1/2 to 1 tablet  2 to 3 x  / Daily as needed for Pain   levothyroxine (SYNTHROID) 50 MCG tablet Take  1 tablet  Daily  on an empty stomach with only water for 30 minutes & no Antacid meds, Calcium or Magnesium for 4 hours & avoid Biotin                                                                                            /                                                                   TAKE                                         BY  MOUTH                           ONCE  ?  ?  ?  ?   DAILY   Magnesium 400 MG TABS Take 1 tablet by mouth 2 (two) times daily.   Omega-3 Fatty Acids (OMEGA-3 FISH OIL) 1200 MG CAPS Take 2 capsules by mouth daily.   pantoprazole (PROTONIX) 40 MG tablet Take 1 tablet (40 mg total) by mouth 2 (two) times daily.   phentermine (ADIPEX-P) 37.5 MG tablet Take  1/2 to 1 tablet  every Morning  for Dieting & Weight Loss   rosuvastatin (CRESTOR) 20 MG tablet Take 1 tablet Daily for Cholesterol   topiramate (TOPAMAX) 50 MG tablet Take 1/2 to 1 tablet 2 x /day at Suppertime & Bedtime for Dieting & Weight Loss   VITAMIN D PO Take 5,000 Int'l Units by mouth.   No current facility-administered medications on file prior to visit.    ROS: Review of Systems  Constitutional:  Negative for chills, diaphoresis, fever, malaise/fatigue and weight loss.  HENT:  Negative for hearing loss and tinnitus.   Eyes:  Negative for blurred vision and double vision.  Respiratory:  Negative for shortness of breath and wheezing.   Cardiovascular:  Negative for chest pain, palpitations,  orthopnea, claudication and leg swelling.  Gastrointestinal:  Negative for abdominal pain, blood in stool, constipation, diarrhea, heartburn, melena, nausea and vomiting.  Genitourinary: Negative.   Musculoskeletal:  Negative for joint pain, myalgias and neck pain.  Skin:  Negative for rash.  Neurological:  Negative for dizziness, tingling, sensory change, weakness and headaches.  Endo/Heme/Allergies:  Negative for polydipsia.  Psychiatric/Behavioral:  Negative for hallucinations, substance abuse and suicidal ideas. The patient has insomnia.   All other systems reviewed and are negative.    Physical Exam:  BP 126/74   Pulse 81   Temp 98 F (36.7 C)   Ht 5\' 4"  (1.626 m)   Wt 145 lb 12.8 oz (66.1 kg)   LMP 03/02/2013   SpO2 99%   BMI 25.03 kg/m   General Appearance: Well nourished, in no apparent distress. Eyes: PERRLA, EOMs, conjunctiva no swelling or erythema Sinuses: No Frontal/maxillary tenderness ENT/Mouth: Ext aud canals clear, TMs without erythema, bulging. Mask in place; oral exam deferred. Hearing normal.  Neck: Supple, thyroid normal.  Respiratory: Respiratory effort normal, BS equal bilaterally without rales, rhonchi, wheezing or stridor.  Cardio: RRR with no MRGs. Brisk peripheral pulses without edema.  Abdomen: Soft, + BS.  Non tender, no guarding, rebound, hernias, masses. Lymphatics: Non tender without lymphadenopathy.  Musculoskeletal: Full ROM, 5/5 strength, normal gait.  Skin: Warm, dry without rashes.  Neuro: Cranial nerves intact. Normal muscle tone, no cerebellar symptoms. Sensation intact.  Psych: Awake and oriented X 3, depressed/tearful affect, Insight and Judgment appropriate.     Adela Glimpse, NP 10:50 AM [facility]

## 2023-08-15 LAB — HEMOGLOBIN A1C
Hgb A1c MFr Bld: 5.9 %{Hb} — ABNORMAL HIGH (ref <5.7)
Mean Plasma Glucose: 123 mg/dL
eAG (mmol/L): 6.8 mmol/L

## 2023-08-15 LAB — COMPLETE METABOLIC PANEL WITH GFR
AG Ratio: 1.6 (calc) (ref 1.0–2.5)
ALT: 12 U/L (ref 6–29)
AST: 15 U/L (ref 10–35)
Albumin: 4.3 g/dL (ref 3.6–5.1)
Alkaline phosphatase (APISO): 52 U/L (ref 37–153)
BUN: 16 mg/dL (ref 7–25)
CO2: 25 mmol/L (ref 20–32)
Calcium: 9.5 mg/dL (ref 8.6–10.4)
Chloride: 107 mmol/L (ref 98–110)
Creat: 0.99 mg/dL (ref 0.50–1.05)
Globulin: 2.7 g/dL (ref 1.9–3.7)
Glucose, Bld: 90 mg/dL (ref 65–139)
Potassium: 4.9 mmol/L (ref 3.5–5.3)
Sodium: 142 mmol/L (ref 135–146)
Total Bilirubin: 0.3 mg/dL (ref 0.2–1.2)
Total Protein: 7 g/dL (ref 6.1–8.1)
eGFR: 65 mL/min/{1.73_m2} (ref >=60)

## 2023-08-15 LAB — TSH: TSH: 1.36 m[IU]/L (ref 0.40–4.50)

## 2023-08-20 ENCOUNTER — Other Ambulatory Visit: Payer: Self-pay | Admitting: Internal Medicine

## 2023-08-20 DIAGNOSIS — E6609 Other obesity due to excess calories: Secondary | ICD-10-CM

## 2023-09-04 ENCOUNTER — Other Ambulatory Visit: Payer: Self-pay | Admitting: Internal Medicine

## 2023-09-04 DIAGNOSIS — Z1231 Encounter for screening mammogram for malignant neoplasm of breast: Secondary | ICD-10-CM

## 2023-09-04 DIAGNOSIS — F321 Major depressive disorder, single episode, moderate: Secondary | ICD-10-CM

## 2023-09-08 ENCOUNTER — Other Ambulatory Visit: Payer: Self-pay | Admitting: Internal Medicine

## 2023-09-08 ENCOUNTER — Encounter: Payer: Self-pay | Admitting: Internal Medicine

## 2023-09-08 DIAGNOSIS — F321 Major depressive disorder, single episode, moderate: Secondary | ICD-10-CM

## 2023-09-08 MED ORDER — ESCITALOPRAM OXALATE 20 MG PO TABS: ORAL_TABLET | ORAL | 3 refills | Status: DC

## 2023-09-18 ENCOUNTER — Encounter: Payer: Self-pay | Admitting: Internal Medicine

## 2023-09-18 ENCOUNTER — Ambulatory Visit (INDEPENDENT_AMBULATORY_CARE_PROVIDER_SITE_OTHER): Payer: 59 | Admitting: Audiology

## 2023-09-18 ENCOUNTER — Encounter (INDEPENDENT_AMBULATORY_CARE_PROVIDER_SITE_OTHER): Payer: Self-pay | Admitting: Otolaryngology

## 2023-09-18 ENCOUNTER — Ambulatory Visit (INDEPENDENT_AMBULATORY_CARE_PROVIDER_SITE_OTHER): Payer: 59 | Admitting: Otolaryngology

## 2023-09-18 VITALS — Ht 64.0 in | Wt 180.0 lb

## 2023-09-18 DIAGNOSIS — H903 Sensorineural hearing loss, bilateral: Secondary | ICD-10-CM

## 2023-09-18 DIAGNOSIS — H9313 Tinnitus, bilateral: Secondary | ICD-10-CM

## 2023-09-18 NOTE — Progress Notes (Unsigned)
Otoscopy: Right: Clear canal; visualized notable tympanic landmarks Left: Clear canal; visualized notable tympanic landmarks  Tympanometry: Right: WNL (Type A) Left: WNL (Type A)  Audiometry: Completed under headphones with good reliability Right: Normal hearing sensitivity from [phone] sloping to moderate sensorineural hearing loss from 6000-8000 Hz; SRT 15 dB HL; WRS 100% at 55 dB HL  Left: Normal hearing sensitivity from [phone] sloping to moderate sensorineural hearing loss from 6000-8000 Hz; SRT 15 dB HL; WRS 100% at 55 dB HL

## 2023-09-18 NOTE — Progress Notes (Signed)
Patient ID: Beth Miranda, female   DOB: 1961/05/03, 62 y.o.   MRN: [id number]  Follow-up: Bilateral tinnitus, bilateral hearing loss  HPI: The patient is a 62 year old female who returns today for follow-up evaluation.  The patient was last seen 1 year ago.  At that time, she was complaining of bilateral high-pitched tinnitus.  Her tinnitus was constant and nonpulsatile.  Her ear canals, tympanic membranes, and middle ear spaces were all normal.  She was noted to have bilateral mild symmetric high-frequency sensorineural hearing loss.  The strategies to cope with tinnitus were discussed.  The patient returns today complaining of persistent bilateral tinnitus.  She denies any otalgia, otorrhea, or vertigo.  She has never worn hearing aids.  Exam: General: Communicates without difficulty, well nourished, no acute distress. Head: Normocephalic, no evidence injury, no tenderness, facial buttresses intact without stepoff. Face/sinus: No tenderness to palpation and percussion. Facial movement is normal and symmetric. Eyes: PERRL, EOMI. No scleral icterus, conjunctivae clear. Neuro: CN II exam reveals vision grossly intact.  No nystagmus at any point of gaze. Ears: Auricles well formed without lesions.  Ear canals are intact without mass or lesion.  No erythema or edema is appreciated.  The TMs are intact without fluid. Nose: External evaluation reveals normal support and skin without lesions.  Dorsum is intact.  Anterior rhinoscopy reveals pink mucosa over anterior aspect of inferior turbinates and intact septum.  No purulence noted. Oral:  Oral cavity and oropharynx are intact, symmetric, without erythema or edema.  Mucosa is moist without lesions. Neck: Full range of motion without pain.  There is no significant lymphadenopathy.  No masses palpable.  Thyroid bed within normal limits to palpation.  Parotid glands and submandibular glands equal bilaterally without mass.  Trachea is midline. Neuro:  CN 2-12  grossly intact. Gait normal.   AUDIOMETRIC TESTING: I have read and reviewed the audiometric test, which shows bilateral high-frequency sensorineural hearing loss. The speech reception threshold is 15dB AD and 15dB AS. The discrimination score is 100% AD and 100% AS. The tympanogram is normal bilaterally.   Assessment: 1.  Bilateral symmetric high-frequency sensorineural hearing loss.  The severity of her hearing loss has slightly increased compared to 1 year ago. 2.  The patient's bilateral tinnitus is likely a direct result of her hearing loss. 3.  The patient's ear canals, tympanic membranes, and middle ear spaces are normal.   Plan: 1.  The physical exam findings and the hearing test results are reviewed with the patient.  2.  The strategies to cope with tinnitus, including the use of masker, hearing aids, tinnitus retraining therapy, and avoidance of caffeine and alcohol are discussed.  3.  The patient is a candidate for hearing amplification.  The hearing aid options are discussed.  Hearing protection is recommended. 4.  The patient will return for reevaluation in 1 year, sooner if needed.

## 2023-09-24 ENCOUNTER — Other Ambulatory Visit: Payer: Self-pay | Admitting: Internal Medicine

## 2023-10-23 ENCOUNTER — Ambulatory Visit
Admission: RE | Admit: 2023-10-23 | Discharge: 2023-10-23 | Disposition: A | Discharge status: Home / Self Care | Payer: 59 | Source: Ambulatory Visit | Attending: Internal Medicine | Admitting: Internal Medicine

## 2023-10-23 DIAGNOSIS — Z1231 Encounter for screening mammogram for malignant neoplasm of breast: Secondary | ICD-10-CM

## 2023-11-06 ENCOUNTER — Encounter: Payer: 59 | Admitting: Internal Medicine

## 2023-11-09 ENCOUNTER — Other Ambulatory Visit: Payer: Self-pay | Admitting: Internal Medicine

## 2023-11-09 DIAGNOSIS — E6609 Other obesity due to excess calories: Secondary | ICD-10-CM

## 2023-11-20 ENCOUNTER — Ambulatory Visit (INDEPENDENT_AMBULATORY_CARE_PROVIDER_SITE_OTHER): Payer: 59 | Admitting: Nurse Practitioner

## 2023-11-20 ENCOUNTER — Ambulatory Visit: Payer: 59 | Admitting: Internal Medicine

## 2023-11-20 VITALS — BP 122/80 | HR 79 | Temp 98.3°F | Ht 64.0 in | Wt 147.6 lb

## 2023-11-20 DIAGNOSIS — R0989 Other specified symptoms and signs involving the circulatory and respiratory systems: Secondary | ICD-10-CM

## 2023-11-20 DIAGNOSIS — G4700 Insomnia, unspecified: Secondary | ICD-10-CM

## 2023-11-20 DIAGNOSIS — F321 Major depressive disorder, single episode, moderate: Secondary | ICD-10-CM

## 2023-11-20 DIAGNOSIS — Z130 Encounter for screening for diseases of the blood and blood-forming organs and certain disorders involving the immune mechanism: Secondary | ICD-10-CM

## 2023-11-20 DIAGNOSIS — Z1389 Encounter for screening for other disorder: Secondary | ICD-10-CM

## 2023-11-20 DIAGNOSIS — Z0001 Encounter for general adult medical examination with abnormal findings: Secondary | ICD-10-CM

## 2023-11-20 DIAGNOSIS — E782 Mixed hyperlipidemia: Secondary | ICD-10-CM

## 2023-11-20 DIAGNOSIS — R7309 Other abnormal glucose: Secondary | ICD-10-CM

## 2023-11-20 DIAGNOSIS — E559 Vitamin D deficiency, unspecified: Secondary | ICD-10-CM

## 2023-11-20 DIAGNOSIS — E669 Obesity, unspecified: Secondary | ICD-10-CM

## 2023-11-20 DIAGNOSIS — Z0000 Encounter for general adult medical examination without abnormal findings: Secondary | ICD-10-CM

## 2023-11-20 DIAGNOSIS — H903 Sensorineural hearing loss, bilateral: Secondary | ICD-10-CM

## 2023-11-20 DIAGNOSIS — K219 Gastro-esophageal reflux disease without esophagitis: Secondary | ICD-10-CM

## 2023-11-20 DIAGNOSIS — Z136 Encounter for screening for cardiovascular disorders: Secondary | ICD-10-CM

## 2023-11-20 DIAGNOSIS — Z1329 Encounter for screening for other suspected endocrine disorder: Secondary | ICD-10-CM

## 2023-11-20 DIAGNOSIS — Z79899 Other long term (current) drug therapy: Secondary | ICD-10-CM

## 2023-11-20 NOTE — Progress Notes (Unsigned)
Complete Physical  Assessment and Plan:  CPE Due annually  Health maintenance reviewed Healthily lifestyle goals set  Hypertension Controlled - slight decline in GFR last OV. Discussed DASH (Dietary Approaches to Stop Hypertension) DASH diet is lower in sodium than a typical American diet. Cut back on foods that are high in saturated fat, cholesterol, and trans fats. Eat more whole-grain foods, fish, poultry, and nuts Remain active and exercise as tolerated daily.  Monitor BP at home-Call if greater than 130/80.  Check CMP   Cholesterol Controlled Continue Rosuvastatin Discussed lifestyle modifications. Recommended diet heavy in fruits and veggies, omega 3's. Decrease consumption of animal meats, cheeses, and dairy products. Remain active and exercise as tolerated. Continue to monitor.   Abnormal glucose Education: Reviewed 'ABCs' of diabetes management  Discussed goals to be met and/or maintained include A1C (<7) Blood pressure (<130/80) Cholesterol (LDL <70) Continue Eye Exam yearly  Continue Dental Exam Q6 mo Discussed dietary recommendations Discussed Physical Activity recommendations Monitor A1C   Hypothyroidism Controlled. Continue Levothyroxine. Reminded to take on an empty stomach 30-93mins before food.  Stop any Biotin Supplement 48-72 hours before next TSH level to reduce the risk of falsely low TSH levels. Continue to monitor.     Obesity Continue Phentermine and Topamax Discussed appropriate BMI Diet modification. Physical activity. Encouraged/praised to build confidence.   Vitamin D deficiency Continue supplement for goal of 60-100 Monitor Vitamin D levels   GERD Following GI Dr. Chales Abrahams Continue Protonix and add TUMS d/t increase in NSAID Aleve since right ankle sprain. No suspected reflux complications (Barret/stricture). Lifestyle modification:  wt loss, avoid meals 2-3h before bedtime. Consider eliminating food triggers:  chocolate,  caffeine, EtOH, acid/spicy food.   Major depression with anxiety (HCC) Continue Lexapro 20 mg Reviewed relaxation techniques.  Sleep hygiene. Recommended Cognitive Behavioral Therapy (CBT) PRN. Recommended mindfulness meditation and exercise.   Insight-oriented psychotherapy given for 16 minutes exclusively. Psychoeducation:  encouraged personality growth wand development through coping techniques and problem-solving skills. Limit/Decrease/Monitor drug/alcohol intake.     Insomnia, unspecified type Continue Gabapentin Discussed good sleep hygiene. Establish bed and wake times. Sleep restriction-only sleep estimated hrs sleep. Bed only for sex and sleep, only sleep when sleepy, out of bed if anxious (stimulus control). Reviewed relaxation techniques, mindful meditations. Expected sleep duration. Addressed worries about not sleeping.    Medication Management All medications discussed and reviewed in full. All questions and concerns regarding medications addressed.     AKI Continue bASA, statin Discussed how what you eat and drink can aide in kidney protection. Stay well hydrated. Avoid high salt foods. Avoid NSAIDS. Keep BP and BG well controlled.   Take medications as prescribed. Remain active and exercise as tolerated daily. Maintain weight.  Continue to monitor. Check CMP/GFR   Right ankle sprain RICE Method Continue NSAID PRN and topical Voltaren Gel Continue to monitor   Discussed med's effects and SE's. Screening labs and tests as requested with regular follow-up as recommended. Over 40 minutes of exam, counseling, chart review, and complex, high level critical decision making was performed this visit.   Future Appointments  Date Time Provider Department Center  11/21/2024 10:00 AM Adela Glimpse, NP [name]    HPI  62 y.o. female  presents for a complete physical. She has Labile hypertension; Gastroesophageal reflux disease; Raynaud's disease;  Hyperlipidemia, mixed; Abnormal glucose; Vitamin D deficiency; Hypothyroidism; MCTD (mixed connective tissue disease), remote  (HCC); Menopausal syndrome; Disorder of connective tissue (HCC); Major depressive disorder, single episode, moderate with anxious  distress (HCC); Cerebral microvascular disease; Sensorineural hearing loss, bilateral; and Tinnitus, bilateral on their problem list.     BMI is Body mass index is 25.34 kg/m., she {HAS HAS DGU:44034} been working on diet and exercise. Wt Readings from Last 3 Encounters:  11/20/23 147 lb 9.6 oz (67 kg)  09/18/23 180 lb (81.6 kg)  08/14/23 145 lb 12.8 oz (66.1 kg)   Her blood pressure {HAS HAS NOT:18834} been controlled at home, today their BP is BP: 122/80 She {DOES_DOES VQQ:59563} workout. She denies chest pain, shortness of breath, dizziness.   She {ACTION; IS/IS OVF:64332951} on cholesterol medication and denies myalgias. Her cholesterol {ACTION; IS/IS NOT:21021397} at goal. The cholesterol last visit was:   Lab Results  Component Value Date   CHOL 150 05/08/2023   HDL 69 05/08/2023   LDLCALC 67 05/08/2023   TRIG 67 05/08/2023   CHOLHDL 2.2 05/08/2023   She {Has/has not:18111} been working on diet and exercise for ***, she {ACTION; IS/IS NOT:21021397} on bASA, she {ACTION; IS/IS NOT:21021397} on ACE/ARB and denies {Symptoms; diabetes w/o none:19199}. Last A1C in the office was:  Lab Results  Component Value Date   HGBA1C 5.9 (H) 08/14/2023   Last GFR: Lab Results  Component Value Date   EGFR 65 08/14/2023   Patient is on Vitamin D supplement.   Lab Results  Component Value Date   VD25OH 76 05/08/2023      Current Medications:  Current Outpatient Medications on File Prior to Visit  Medication Sig Dispense Refill   aspirin 81 MG tablet Take 81 mg by mouth daily.     CINNAMON PO Take by mouth.     Cyanocobalamin (VITAMIN B 12 PO) Take 1,000 mcg by mouth daily.     escitalopram (LEXAPRO) 20 MG tablet Take  1 tablet   Daily for Mood                                 /                                                                   TAKE                BY               MOUTH                       ONCE ? ? ?  DAILY. 90 tablet 3   gabapentin (NEURONTIN) 600 MG tablet Take 1/2 to 1 tablet 2 to 3 times daily as needed for pain. 90 tablet 5   levothyroxine (SYNTHROID) 50 MCG tablet Take  1 tablet  Daily  on an empty stomach with only water for 30 minutes & no Antacid meds, Calcium or Magnesium for 4 hours & avoid Biotin                                                                                            /  TAKE                                         BY                                                 MOUTH                           ONCE  ?  ?  ?  ?   DAILY 90 tablet 3   Magnesium 400 MG TABS Take 1 tablet by mouth 2 (two) times daily.     Omega-3 Fatty Acids (OMEGA-3 FISH OIL) 1200 MG CAPS Take 2 capsules by mouth daily.     pantoprazole (PROTONIX) 40 MG tablet Take 1 tablet (40 mg total) by mouth 2 (two) times daily. 90 tablet 3   phentermine (ADIPEX-P) 37.5 MG tablet Take  1/2 to 1 tablet  every Morning  for Dieting & Weight Loss 90 tablet 1   rosuvastatin (CRESTOR) 20 MG tablet Take 1 tablet Daily for Cholesterol 90 tablet 3   topiramate (TOPAMAX) 50 MG tablet Take 1/2 to 1 tablet 2 times per day at Suppertime & Bedtime for Dieting & Weight Loss. 60 tablet 1   VITAMIN D PO Take 5,000 Int'l Units by mouth.     Cetirizine HCl (ZYRTEC ALLERGY PO) Take 10 mg by mouth at bedtime. (Patient not taking: Reported on 11/20/2023)     Ferrous Sulfate (IRON PO) Take by mouth 3 (three) times a week. (Patient not taking: Reported on 11/20/2023)     No current facility-administered medications on file prior to visit.   Allergies:  Allergies  Allergen Reactions   Penicillins Hives   Sulfa Antibiotics Hives   Medical History:  She has Labile hypertension;  Gastroesophageal reflux disease; Raynaud's disease; Hyperlipidemia, mixed; Abnormal glucose; Vitamin D deficiency; Hypothyroidism; MCTD (mixed connective tissue disease), remote  (HCC); Menopausal syndrome; Disorder of connective tissue (HCC); Major depressive disorder, single episode, moderate with anxious distress (HCC); Cerebral microvascular disease; Sensorineural hearing loss, bilateral; and Tinnitus, bilateral on their problem list.  Health Maintenance:   Immunization History  Administered Date(s) Administered   Hepatitis B 06/06/2002, 07/07/2002, 12/29/2002   Influenza Inj Mdck Quad With Preservative 11/28/2020   Influenza,inj,Quad PF,6+ Mos 10/31/2022   Influenza-Unspecified 10/14/2018, 11/02/2023   PFIZER(Purple Top)SARS-COV-2 Vaccination 02/19/2020, 03/11/2020   PPD Test 05/09/2014, 05/10/2015, 06/04/2016, 07/01/2017, 07/26/2018, 08/15/2019, 08/30/2020, 09/18/2021, 10/31/2022   Pneumococcal-Unspecified 12/15/1992   Td 01/06/2007   Tdap 07/01/2017   Zoster Recombinant(Shingrix) 03/01/2018, 05/21/2018   Health Maintenance  Topic Date Due   COVID-19 Vaccine (3 - Pfizer risk series) 04/08/2020   MAMMOGRAM  10/22/2024   Cervical Cancer Screening (HPV/Pap Cotest)  05/16/2027   DTaP/Tdap/Td (3 - Td or Tdap) 07/02/2027   Colonoscopy  06/06/2032   INFLUENZA VACCINE  Completed   Hepatitis C Screening  Completed   HIV Screening  Completed   Zoster Vaccines- Shingrix  Completed   HPV VACCINES  Aged Out    LMP: Patient's last menstrual period was 03/02/2013. Sexually Active: {YES NO:22349} STD testing offered Pap: MGM:  DEXA:  Colonoscopy: EGD:  Last Dental Exam: Last Eye Exam: Last Derm Exam:   Patient Care Team:  Lucky Cowboy, MD as PCP - General (Internal Medicine) Louis Meckel, MD (Inactive) as Consulting Physician (Gastroenterology) Drema Halon, MD (Inactive) as Consulting Physician (Otolaryngology)  Surgical History:  She has a past surgical  history that includes Tonsillectomy; Foot surgery (09/28/2020); and Colonoscopy. Family History:  Herfamily history includes Arthritis in her mother; Asthma in her mother; COPD in her mother; Diabetes in her father; Hyperlipidemia in her father; Hypertension in her father; Kidney disease in her father; Other in her brother; Pancreatic cancer in her paternal grandmother. Social History:  She reports that she has quit smoking. Her smoking use included cigarettes. She has never used smokeless tobacco. She reports current alcohol use. She reports that she does not use drugs.  Review of Systems: ROS  Physical Exam: Estimated body mass index is 25.34 kg/m as calculated from the following:   Height as of this encounter: 5\' 4"  (1.626 m).   Weight as of this encounter: 147 lb 9.6 oz (67 kg). BP 122/80   Pulse 79   Temp 98.3 F (36.8 C)   Ht 5\' 4"  (1.626 m)   Wt 147 lb 9.6 oz (67 kg)   LMP 03/02/2013   SpO2 99%   BMI 25.34 kg/m   General Appearance: Well nourished, well developed, in no apparent distress.  Eyes: PERRLA, EOMs, conjunctiva no swelling or erythema, normal fundi and vessels.  Sinuses: No Frontal/maxillary tenderness  ENT/Mouth: Ext aud canals clear, normal light reflex with TMs without erythema, bulging. Good dentition. No erythema, swelling, or exudate on post pharynx. Tonsils not swollen or erythematous. Hearing normal.  Neck: Supple, thyroid normal. No bruits  Respiratory: Respiratory effort normal, BS equal bilaterally without rales, rhonchi, wheezing or stridor.  Cardio: RRR without murmurs, rubs or gallops. Brisk peripheral pulses without edema.  Chest: symmetric, with normal excursions and percussion.  Breasts: Symmetric, without lumps, nipple discharge, retractions.  Abdomen: Soft, nontender, no guarding, rebound, hernias, masses, or organomegaly.  Lymphatics: Non tender without lymphadenopathy.  Musculoskeletal: Full ROM all peripheral extremities,5/5 strength, and  normal gait.  Skin: Warm, dry without rashes, lesions, ecchymosis. Neuro: Cranial nerves intact, reflexes equal bilaterally. Normal muscle tone, no cerebellar symptoms. Sensation intact.  Psych: Awake and oriented X 3, normal affect, Insight and Judgment appropriate.  Genitourinary: {genitalia ZOXW:9604540}  EKG: WNL no ST changes. AORTA SCAN: WNL   Adela Glimpse, NP 10:36 AM  [facility]

## 2023-11-23 ENCOUNTER — Other Ambulatory Visit: Payer: Self-pay | Admitting: Gastroenterology

## 2023-11-23 ENCOUNTER — Encounter: Payer: Self-pay | Admitting: Nurse Practitioner

## 2023-11-23 DIAGNOSIS — K219 Gastro-esophageal reflux disease without esophagitis: Secondary | ICD-10-CM

## 2023-11-23 LAB — URINALYSIS, ROUTINE W REFLEX MICROSCOPIC
Bilirubin Urine: NEGATIVE
Glucose, UA: NEGATIVE
Hgb urine dipstick: NEGATIVE
Ketones, ur: NEGATIVE
Leukocytes,Ua: NEGATIVE
Nitrite: NEGATIVE
Protein, ur: NEGATIVE
Specific Gravity, Urine: 1.008 (ref 1.001–1.035)
pH: 8 (ref 5.0–8.0)

## 2023-11-23 LAB — CBC WITH DIFFERENTIAL/PLATELET
Absolute Lymphocytes: 1188 {cells}/uL (ref 850–3900)
Absolute Monocytes: 412 {cells}/uL (ref 200–950)
Basophils Absolute: 32 {cells}/uL (ref 0–200)
Basophils Relative: 0.8 %
Eosinophils Absolute: 92 {cells}/uL (ref 15–500)
Eosinophils Relative: 2.3 %
HCT: 41.8 % (ref 35.0–45.0)
Hemoglobin: 13.5 g/dL (ref 11.7–15.5)
MCH: 28.8 pg (ref 27.0–33.0)
MCHC: 32.3 g/dL (ref 32.0–36.0)
MCV: 89.3 fL (ref 80.0–100.0)
MPV: 10.5 fL (ref 7.5–12.5)
Monocytes Relative: 10.3 %
Neutro Abs: 2276 {cells}/uL (ref 1500–7800)
Neutrophils Relative %: 56.9 %
Platelets: 250 10*3/uL (ref 140–400)
RBC: 4.68 10*6/uL (ref 3.80–5.10)
RDW: 12.3 % (ref 11.0–15.0)
Total Lymphocyte: 29.7 %
WBC: 4 10*3/uL (ref 3.8–10.8)

## 2023-11-23 LAB — COMPLETE METABOLIC PANEL WITH GFR
AG Ratio: 1.5 (calc) (ref 1.0–2.5)
ALT: 13 U/L (ref 6–29)
AST: 17 U/L (ref 10–35)
Albumin: 4.3 g/dL (ref 3.6–5.1)
Alkaline phosphatase (APISO): 52 U/L (ref 37–153)
BUN: 13 mg/dL (ref 7–25)
CO2: 29 mmol/L (ref 20–32)
Calcium: 9.6 mg/dL (ref 8.6–10.4)
Chloride: 108 mmol/L (ref 98–110)
Creat: 0.95 mg/dL (ref 0.50–1.05)
Globulin: 2.8 g/dL (ref 1.9–3.7)
Glucose, Bld: 91 mg/dL (ref 65–139)
Potassium: 4.2 mmol/L (ref 3.5–5.3)
Sodium: 141 mmol/L (ref 135–146)
Total Bilirubin: 0.4 mg/dL (ref 0.2–1.2)
Total Protein: 7.1 g/dL (ref 6.1–8.1)
eGFR: 68 mL/min/{1.73_m2} (ref >=60)

## 2023-11-23 LAB — LIPID PANEL
Cholesterol: 162 mg/dL (ref <200)
HDL: 83 mg/dL (ref >=50)
LDL Cholesterol (Calc): 66 mg/dL
Non-HDL Cholesterol (Calc): 79 mg/dL (ref <130)
Total CHOL/HDL Ratio: 2 (calc) (ref <5.0)
Triglycerides: 53 mg/dL (ref <150)

## 2023-11-23 LAB — HEMOGLOBIN A1C
Hgb A1c MFr Bld: 5.7 %{Hb} — ABNORMAL HIGH (ref <5.7)
Mean Plasma Glucose: 117 mg/dL
eAG (mmol/L): 6.5 mmol/L

## 2023-11-23 LAB — INSULIN, RANDOM: Insulin: 6.4 u[IU]/mL

## 2023-11-23 LAB — MICROALBUMIN / CREATININE URINE RATIO
Creatinine, Urine: 35 mg/dL (ref 20–275)
Microalb Creat Ratio: 6 mg/g{creat} (ref <30)
Microalb, Ur: 0.2 mg/dL

## 2023-11-23 LAB — MAGNESIUM: Magnesium: 2.2 mg/dL (ref 1.5–2.5)

## 2023-11-23 LAB — VITAMIN D 25 HYDROXY (VIT D DEFICIENCY, FRACTURES): Vit D, 25-Hydroxy: 58 ng/mL (ref 30–100)

## 2023-11-23 LAB — VITAMIN B12: Vitamin B-12: 874 pg/mL (ref 200–1100)

## 2023-11-23 LAB — TSH: TSH: 1.81 m[IU]/L (ref 0.40–4.50)

## 2023-11-23 NOTE — Patient Instructions (Signed)

## 2023-11-30 ENCOUNTER — Other Ambulatory Visit: Payer: Self-pay | Admitting: Internal Medicine

## 2023-11-30 DIAGNOSIS — E782 Mixed hyperlipidemia: Secondary | ICD-10-CM

## 2023-12-14 ENCOUNTER — Ambulatory Visit
Admission: EM | Admit: 2023-12-14 | Discharge: 2023-12-14 | Disposition: A | Discharge status: Home / Self Care | Payer: 59 | Attending: Nurse Practitioner | Admitting: Nurse Practitioner

## 2023-12-14 DIAGNOSIS — J069 Acute upper respiratory infection, unspecified: Secondary | ICD-10-CM | POA: Diagnosis not present

## 2023-12-14 LAB — POC COVID19/FLU A&B COMBO
Covid Antigen, POC: NEGATIVE
Influenza A Antigen, POC: NEGATIVE
Influenza B Antigen, POC: NEGATIVE

## 2023-12-14 MED ORDER — BENZONATATE 100 MG PO CAPS: 100.0000 mg | ORAL_CAPSULE | Freq: Three times a day (TID) | ORAL | 0 refills | Status: AC | PRN

## 2023-12-14 MED ORDER — ONDANSETRON 8 MG PO TBDP: 8.0000 mg | ORAL_TABLET | Freq: Three times a day (TID) | ORAL | 0 refills | Status: AC | PRN

## 2023-12-14 NOTE — ED Provider Notes (Signed)
[facility]    CSN: [id number] Arrival date & time: 12/14/23  1150      History   Chief Complaint Chief Complaint  Patient presents with   Cough   Diarrhea    HPI Beth Miranda is a 62 y.o. female.   HPI  She is in today for evaluation of headache, chills and diarrhea that started this morning.  She endorses that she has had nasal gesture for about 3 weeks.  She has a tight cough.  She endorses that her husband is also not feeling well.  She reports that the diarrhea is what brought her in today.  She admits that it started out as soft stools but now is liquid.  She is able to eat and drink.  She has been taking DayQuil NyQuil and Robitussin for her symptoms.  She denies fever,, shortness of breath, chest pains, vomiting. Past Medical History:  Diagnosis Date   Allergy    seasonal   GERD (gastroesophageal reflux disease)    Hyperlipidemia    Labile hypertension    Lyme disease    in the 1990/s   Prediabetes    Raynaud's disease    Thyroid disease    hypothyroid   Vitamin D deficiency     Patient Active Problem List   Diagnosis Date Noted   Sensorineural hearing loss, bilateral 09/18/2023   Tinnitus, bilateral 09/18/2023   Cerebral microvascular disease 09/05/2021   Major depressive disorder, single episode, moderate with anxious distress (HCC) 02/07/2020   Menopausal syndrome 02/22/2019   Disorder of connective tissue (HCC) 02/22/2019   MCTD (mixed connective tissue disease), remote  (HCC) 06/04/2016   Hypothyroidism 05/09/2014   Labile hypertension    Gastroesophageal reflux disease    Raynaud's disease    Hyperlipidemia, mixed    Abnormal glucose    Vitamin D deficiency     Past Surgical History:  Procedure Laterality Date   COLONOSCOPY     FOOT SURGERY  09/28/2020   TENDON REPAIR    TONSILLECTOMY      OB History     Gravida  0   Para  0   Term  0   Preterm  0   AB  0   Living  0      SAB  0   IAB  0   Ectopic  0    Multiple  0   Live Births               Home Medications    Prior to Admission medications   Medication Sig Start Date End Date Taking? Authorizing Provider  aspirin 81 MG tablet Take 81 mg by mouth daily.   Yes [provider]  benzonatate (TESSALON) 100 MG capsule Take 1 capsule (100 mg total) by mouth 3 (three) times daily as needed for up to 10 days for cough. Never suck or chew on a benzonatate capsule. 12/14/23 12/24/23 Yes Barbette Merino, NP  Cetirizine HCl (ZYRTEC ALLERGY PO) Take 10 mg by mouth at bedtime.   Yes [provider]  CINNAMON PO Take by mouth.   Yes [provider]  Cyanocobalamin (VITAMIN B 12 PO) Take 1,000 mcg by mouth daily.   Yes [provider]  escitalopram (LEXAPRO) 20 MG tablet Take  1 tablet  Daily for Mood                                 /  TAKE                BY               MOUTH                       ONCE ? ? ?  DAILY. 09/08/23  Yes Lucky Cowboy, MD  gabapentin (NEURONTIN) 600 MG tablet Take 1/2 to 1 tablet 2 to 3 times daily as needed for pain. 09/24/23  Yes Cranford, Archie Patten, NP  levothyroxine (SYNTHROID) 50 MCG tablet Take  1 tablet  Daily  on an empty stomach with only water for 30 minutes & no Antacid meds, Calcium or Magnesium for 4 hours & avoid Biotin                                                                                            /                                                                   TAKE                                         BY                                                 MOUTH                           ONCE  ?  ?  ?  ?   DAILY 06/02/23  Yes Lucky Cowboy, MD  Magnesium 400 MG TABS Take 1 tablet by mouth 2 (two) times daily.   Yes [provider]  Omega-3 Fatty Acids (OMEGA-3 FISH OIL) 1200 MG CAPS Take 2 capsules by mouth daily.   Yes [provider]  ondansetron (ZOFRAN-ODT) 8 MG disintegrating  tablet Take 1 tablet (8 mg total) by mouth every 8 (eight) hours as needed for up to 5 days for nausea or vomiting. 12/14/23 12/19/23 Yes Natosha Bou, Shana Chute, NP  pantoprazole (PROTONIX) 40 MG tablet Take 1 tablet (40 mg total) by mouth 2 (two) times daily. 11/23/23  Yes Lynann Bologna, MD  phentermine (ADIPEX-P) 37.5 MG tablet Take  1/2 to 1 tablet  every Morning  for Dieting & Weight Loss 05/09/23  Yes Lucky Cowboy, MD  rosuvastatin (CRESTOR) 20 MG tablet TAKE (1) TABLET BY MOUTH ONCE DAILY FOR CHOLESTEROL. 11/30/23  Yes Raynelle Dick, NP  topiramate (TOPAMAX) 50 MG tablet Take 1/2 to 1 tablet 2 times per day at Suppertime & Bedtime for Dieting & Weight Loss. 11/09/23  Yes Cranford, Tonya, NP  VITAMIN D PO Take 5,000 Int'l Units by mouth.   Yes [provider]  Ferrous Sulfate (IRON PO) Take by mouth 3 (three) times a week. Patient not taking: Reported on 11/20/2023    [provider]    Family History Family History  Problem Relation Age of Onset   Arthritis Mother        Living   COPD Mother    Asthma Mother    Hypertension Father        Living   Diabetes Father    Kidney disease Father        Stones   Hyperlipidemia Father    Pancreatic cancer Paternal Grandmother    Other Brother        Deceased   Breast cancer Neg Hx    Colon cancer Neg Hx    Esophageal cancer Neg Hx    Rectal cancer Neg Hx    Stomach cancer Neg Hx    BRCA 1/2 Neg Hx     Social History Social History   Tobacco Use   Smoking status: Former    Types: Cigarettes   Smokeless tobacco: Never  Vaping Use   Vaping status: Never Used  Substance Use Topics   Alcohol use: Yes    Comment: occasionally   Drug use: No     Allergies   Penicillins and Sulfa antibiotics   Review of Systems Review of Systems   Physical Exam Triage Vital Signs ED Triage Vitals  Encounter Vitals Group     BP 12/14/23 1400 (!) 160/89     Systolic BP Percentile --      Diastolic BP Percentile --       Pulse Rate 12/14/23 1315 75     Resp 12/14/23 1315 16     Temp 12/14/23 1315 97.8 F (36.6 C)     Temp Source 12/14/23 1315 Oral     SpO2 12/14/23 1315 98 %     Weight --      Height --      Head Circumference --      Peak Flow --      Pain Score 12/14/23 1316 4     Pain Loc --      Pain Education --      Exclude from Growth Chart --    No data found.  Updated Vital Signs BP (!) 160/89   Pulse 75   Temp 97.8 F (36.6 C) (Oral)   Resp 16   LMP 03/02/2013   SpO2 98%   Visual Acuity Right Eye Distance:   Left Eye Distance:   Bilateral Distance:    Right Eye Near:   Left Eye Near:    Bilateral Near:     Physical Exam Constitutional:      General: She is not in acute distress.    Appearance: She is not ill-appearing.  HENT:     Head: Normocephalic and atraumatic.     Nose: Nose normal.     Mouth/Throat:     Mouth: Mucous membranes are moist.  Cardiovascular:     Rate and Rhythm: Normal rate and regular rhythm.     Pulses: Normal pulses.     Heart sounds: Normal heart sounds.  Pulmonary:     Effort: Pulmonary effort is normal.     Breath sounds: Normal breath sounds. No stridor. No wheezing, rhonchi or rales.  Abdominal:     General: Abdomen is flat.     Comments:  Hypoactive  Musculoskeletal:        General: Normal range of motion.     Cervical back: Normal range of motion.  Skin:    General: Skin is warm and dry.     Capillary Refill: Capillary refill takes less than 2 seconds.  Neurological:     General: No focal deficit present.     Mental Status: She is alert and oriented to person, place, and time.  Psychiatric:        Mood and Affect: Mood normal.        Behavior: Behavior normal.      UC Treatments / Results  Labs (all labs ordered are listed, but only abnormal results are displayed) Labs Reviewed  POC COVID19/FLU A&B COMBO - Normal    EKG   Radiology No results found.  Procedures Procedures (including critical care  time)  Medications Ordered in UC Medications - No data to display  Initial Impression / Assessment and Plan / UC Course  I have reviewed the triage vital signs and the nursing notes.  Pertinent labs & imaging results that were available during my care of the patient were reviewed by me and considered in my medical decision making (see chart for details).     Headache and chills Final Clinical Impressions(s) / UC Diagnoses   Final diagnoses:  Upper respiratory tract infection, unspecified type     Discharge Instructions      Your COVID, Influenza and Strep Test are all negative.   You may have an Upper Respiratory virus.  We encourage conservative treatment with symptom.  relief.  You have been prescribed ondansetron 8 mg ODT every 8 hours as needed for nausea.  We encourage you to use Tylenol alternating with Ibuprofen for pain or fever if not contraindicated. (Remember to use as directed do not exceed daily dosing recommendations) Your cough can be soothed with a cough suppressant. We have prescribed you a cough suppressant benzonatate 100 mg every 8 hours as needed for cough.  If symptoms persist or get worse encouraged to go to the nearest follow-up with your primary care provider. You are encouraged to hydrate well with the other liquids for the diarrhea and to Imodium as directed.      ED Prescriptions     Medication Sig Dispense Auth. Provider   ondansetron (ZOFRAN-ODT) 8 MG disintegrating tablet Take 1 tablet (8 mg total) by mouth every 8 (eight) hours as needed for up to 5 days for nausea or vomiting. 15 tablet Thad Ranger M, NP   benzonatate (TESSALON) 100 MG capsule Take 1 capsule (100 mg total) by mouth 3 (three) times daily as needed for up to 10 days for cough. Never suck or chew on a benzonatate capsule. 30 capsule Barbette Merino, NP      PDMP not reviewed this encounter.   Barbette Merino, NP 12/14/23 1434

## 2023-12-14 NOTE — Discharge Instructions (Addendum)
Your COVID, Influenza and Strep Test are all negative.   You may have an Upper Respiratory virus.  We encourage conservative treatment with symptom.  relief.  You have been prescribed ondansetron 8 mg ODT every 8 hours as needed for nausea.  We encourage you to use Tylenol alternating with Ibuprofen for pain or fever if not contraindicated. (Remember to use as directed do not exceed daily dosing recommendations) Your cough can be soothed with a cough suppressant. We have prescribed you a cough suppressant benzonatate 100 mg every 8 hours as needed for cough.  If symptoms persist or get worse encouraged to go to the nearest follow-up with your primary care provider. You are encouraged to hydrate well with the other liquids for the diarrhea and to Imodium as directed.

## 2023-12-14 NOTE — ED Triage Notes (Signed)
Cough, runny nose x 3 weeks. Now having chills, diarrhea, headache that started today. Taking dyquil and Nyquil, and robitussin.

## 2023-12-18 ENCOUNTER — Other Ambulatory Visit: Payer: Self-pay | Admitting: Internal Medicine

## 2023-12-18 DIAGNOSIS — E6609 Other obesity due to excess calories: Secondary | ICD-10-CM

## 2024-02-15 ENCOUNTER — Other Ambulatory Visit: Payer: Self-pay

## 2024-02-15 DIAGNOSIS — E6609 Other obesity due to excess calories: Secondary | ICD-10-CM

## 2024-02-15 MED ORDER — TOPIRAMATE 50 MG PO TABS
ORAL_TABLET | ORAL | 1 refills | Status: DC
Start: 2024-02-15 — End: 2024-03-04

## 2024-02-16 ENCOUNTER — Other Ambulatory Visit: Payer: Self-pay

## 2024-02-16 DIAGNOSIS — E039 Hypothyroidism, unspecified: Secondary | ICD-10-CM

## 2024-02-16 MED ORDER — LEVOTHYROXINE SODIUM 50 MCG PO TABS
ORAL_TABLET | ORAL | 0 refills | Status: DC
Start: 2024-02-16 — End: 2024-06-10

## 2024-03-04 ENCOUNTER — Ambulatory Visit: Payer: 59 | Admitting: Family Medicine

## 2024-03-04 ENCOUNTER — Encounter: Payer: Self-pay | Admitting: Family Medicine

## 2024-03-04 VITALS — BP 130/80 | HR 85 | Temp 98.8°F | Ht 64.0 in | Wt 147.0 lb

## 2024-03-04 DIAGNOSIS — Z6830 Body mass index (BMI) 30.0-30.9, adult: Secondary | ICD-10-CM

## 2024-03-04 DIAGNOSIS — E039 Hypothyroidism, unspecified: Secondary | ICD-10-CM | POA: Diagnosis not present

## 2024-03-04 DIAGNOSIS — E782 Mixed hyperlipidemia: Secondary | ICD-10-CM

## 2024-03-04 DIAGNOSIS — E66811 Obesity, class 1: Secondary | ICD-10-CM

## 2024-03-04 DIAGNOSIS — R7303 Prediabetes: Secondary | ICD-10-CM

## 2024-03-04 DIAGNOSIS — E559 Vitamin D deficiency, unspecified: Secondary | ICD-10-CM

## 2024-03-04 DIAGNOSIS — I10 Essential (primary) hypertension: Secondary | ICD-10-CM

## 2024-03-04 DIAGNOSIS — F321 Major depressive disorder, single episode, moderate: Secondary | ICD-10-CM

## 2024-03-04 DIAGNOSIS — E6609 Other obesity due to excess calories: Secondary | ICD-10-CM

## 2024-03-04 DIAGNOSIS — K219 Gastro-esophageal reflux disease without esophagitis: Secondary | ICD-10-CM

## 2024-03-04 DIAGNOSIS — M359 Systemic involvement of connective tissue, unspecified: Secondary | ICD-10-CM

## 2024-03-04 MED ORDER — TOPIRAMATE 100 MG PO TABS
100.0000 mg | ORAL_TABLET | Freq: Two times a day (BID) | ORAL | 1 refills | Status: DC
Start: 2024-03-04 — End: 2024-07-19

## 2024-03-04 NOTE — Patient Instructions (Addendum)
 Welcome to [facility]!  Thank you for choosing [place] for your Primary Care needs.   We offer in person and video appointments for your convenience. You may call our office to schedule appointments, or you may schedule appointments with me through MyChart.   The best way to get in contact with me is via MyChart message. This will get to me faster than a phone call, unless there is an emergency, then please call 911.  The lab is located downstairs in the Sports Medicine building, we also have xray available there.   Follow-up with me in a month to recheck medications.

## 2024-03-04 NOTE — Progress Notes (Signed)
 New Patient Office Visit  Subjective    Patient ID: Beth Miranda, female    DOB: 1961-09-05  Age: 63 y.o. MRN: [id number]  CC:  Chief Complaint  Patient presents with   Establish Care    HPI Beth Miranda presents to establish care today. Has past medical history including hypothyroidism, CVD, vitamin D deficiency, MDD, connective tissue disorder. Reports compliance with medication regimen. Takes phentermine and Topamax for weight loss. States that she started this about May of last year, states she has plateaued and is wondering if there is anything else that can be done for weight loss. Also inquiring if Lexapro was contributing to weight. Has been taking 20 mg of Lexapro for about 4 years. She is up-to-date on routine screenings. She is up-to-date on routine vaccines. Denies other concerns today. Medical history as outlined below.  Outpatient Encounter Medications as of 03/04/2024  Medication Sig   topiramate (TOPAMAX) 100 MG tablet Take 1 tablet (100 mg total) by mouth 2 (two) times daily.   aspirin 81 MG tablet Take 81 mg by mouth daily.   Cetirizine HCl (ZYRTEC ALLERGY PO) Take 10 mg by mouth at bedtime.   CINNAMON PO Take by mouth.   Cyanocobalamin (VITAMIN B 12 PO) Take 1,000 mcg by mouth daily.   escitalopram (LEXAPRO) 20 MG tablet Take  1 tablet  Daily for Mood                                 /                                                                   TAKE                BY               MOUTH                       ONCE ? ? ?  DAILY.   Ferrous Sulfate (IRON PO) Take by mouth 3 (three) times a week. (Patient not taking: Reported on 03/04/2024)   gabapentin (NEURONTIN) 600 MG tablet Take 1/2 to 1 tablet 2 to 3 times daily as needed for pain.   levothyroxine (SYNTHROID) 50 MCG tablet Take  1 tablet  Daily  on an empty stomach with only water for 30 minutes & no Antacid meds, Calcium or Magnesium for 4 hours & avoid Biotin                                                                                             /  TAKE                                         BY                                                 MOUTH                           ONCE  ?  ?  ?  ?   DAILY   Magnesium 400 MG TABS Take 1 tablet by mouth 2 (two) times daily.   Omega-3 Fatty Acids (OMEGA-3 FISH OIL) 1200 MG CAPS Take 2 capsules by mouth daily.   pantoprazole (PROTONIX) 40 MG tablet Take 1 tablet (40 mg total) by mouth 2 (two) times daily.   phentermine (ADIPEX-P) 37.5 MG tablet Take 1/2 to 1 tablet every Morning for Dieting & Weight Loss.   rosuvastatin (CRESTOR) 20 MG tablet TAKE (1) TABLET BY MOUTH ONCE DAILY FOR CHOLESTEROL.   VITAMIN D PO Take 5,000 Int'l Units by mouth.   [DISCONTINUED] topiramate (TOPAMAX) 50 MG tablet Take 1/2 to 1 tablet 2 times per day at Suppertime & Bedtime for Dieting & Weight Loss.   No facility-administered encounter medications on file as of 03/04/2024.    Past Medical History:  Diagnosis Date   Allergy    seasonal   GERD (gastroesophageal reflux disease)    Hyperlipidemia    Labile hypertension    Lyme disease    in the 1990/s   Prediabetes    Raynaud's disease    Thyroid disease    hypothyroid   Vitamin D deficiency     Past Surgical History:  Procedure Laterality Date   COLONOSCOPY     FOOT SURGERY  09/28/2020   TENDON REPAIR    TONSILLECTOMY      Family History  Problem Relation Age of Onset   Arthritis Mother        Living   COPD Mother    Asthma Mother    Hypertension Father        Living   Diabetes Father    Kidney disease Father        Stones   Hyperlipidemia Father    Pancreatic cancer Paternal Grandmother    Other Brother        Deceased   Breast cancer Neg Hx    Colon cancer Neg Hx    Esophageal cancer Neg Hx    Rectal cancer Neg Hx    Stomach cancer Neg Hx    BRCA 1/2 Neg Hx     Social History   Socioeconomic History   Marital  status: Married    Spouse name: Not on file   Number of children: Not on file   Years of education: Not on file   Highest education level: Not on file  Occupational History   Not on file  Tobacco Use   Smoking status: Former    Types: Cigarettes   Smokeless tobacco: Never  Vaping Use   Vaping status: Never Used  Substance and Sexual Activity   Alcohol use: Yes    Comment: occasionally   Drug use: No   Sexual activity: Yes    Birth control/protection:  Post-menopausal    Comment: 1st intercourse 63 yo-Fewer than 5 partners  Other Topics Concern   Not on file  Social History Narrative   Lives with husband in a one story home with basement.  No children.     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   Social Drivers of Corporate investment banker Strain: Not on file  Food Insecurity: Not on file  Transportation Needs: Not on file  Physical Activity: Not on file  Stress: Not on file  Social Connections: Not on file  Intimate Partner Violence: Not on file    ROS Per HPI      Objective    BP 130/80 (BP Location: Left Arm, Patient Position: Sitting)   Pulse 85   Temp 98.8 F (37.1 C) (Temporal)   Ht 5\' 4"  (1.626 m)   Wt 147 lb (66.7 kg)   LMP 03/02/2013   SpO2 96%   BMI 25.23 kg/m   Physical Exam Vitals and nursing note reviewed.  Constitutional:      General: She is not in acute distress.    Appearance: Normal appearance. She is normal weight.  HENT:     Head: Normocephalic and atraumatic.     Nose: Nose normal.  Eyes:     Extraocular Movements: Extraocular movements intact.     Pupils: Pupils are equal, round, and reactive to light.  Cardiovascular:     Rate and Rhythm: Normal rate and regular rhythm.     Heart sounds: Normal heart sounds.  Pulmonary:     Effort: Pulmonary effort is normal. No respiratory distress.     Breath sounds: Normal breath sounds. No wheezing, rhonchi or rales.  Musculoskeletal:         General: Normal range of motion.     Cervical back: Normal range of motion.  Lymphadenopathy:     Cervical: No cervical adenopathy.  Skin:    General: Skin is warm and dry.  Neurological:     General: No focal deficit present.     Mental Status: She is alert and oriented to person, place, and time.  Psychiatric:        Mood and Affect: Mood normal.        Thought Content: Thought content normal.        Assessment & Plan:   Class 1 obesity due to excess calories with serious comorbidity and body mass index (BMI) of 30.0 to 30.9 in adult -     Topiramate; Take 1 tablet (100 mg total) by mouth 2 (two) times daily.  Dispense: 60 tablet; Refill: 1 -     Lipid panel; Future  Hypothyroidism, unspecified type -     Urinalysis, Routine w reflex microscopic; Future -     TSH; Future  Vitamin D deficiency -     Magnesium; Future -     VITAMIN D 25 Hydroxy (Vit-D Deficiency, Fractures); Future  Disorder of connective tissue (HCC) -     CBC with Differential/Platelet; Future -     Urinalysis, Routine w reflex microscopic; Future -     Magnesium; Future  Hyperlipidemia, mixed -     Lipid panel; Future -     Hemoglobin A1c; Future  Major depressive disorder, single episode, moderate with anxious distress (HCC) -     CBC with Differential/Platelet; Future  Gastroesophageal reflux disease without esophagitis -     CBC with Differential/Platelet; Future  Pre-diabetes -  CBC with Differential/Platelet; Future -     Lipid panel; Future -     Urinalysis, Routine w reflex microscopic; Future -     Hemoglobin A1c; Future  Primary hypertension -     CBC with Differential/Platelet; Future -     Comprehensive metabolic panel; Future -     Lipid panel; Future -     Urinalysis, Routine w reflex microscopic; Future  Will increase Topamax to 100 mg twice daily, continue phentermine 37.5 mg Discussed that when taking the evening dose of Topamax, she may be able to cut her  gabapentin in half Follow-up in 1 month, if weight is stable we will continue with this change. Future labs ordered  Return in about 4 weeks (around 04/01/2024).   Moshe Cipro, FNP

## 2024-04-08 ENCOUNTER — Encounter: Payer: Self-pay | Admitting: Family Medicine

## 2024-04-08 ENCOUNTER — Ambulatory Visit: Admitting: Family Medicine

## 2024-04-08 VITALS — BP 136/80 | HR 74 | Temp 98.7°F | Ht 64.0 in | Wt 158.4 lb

## 2024-04-08 DIAGNOSIS — E66811 Obesity, class 1: Secondary | ICD-10-CM

## 2024-04-08 DIAGNOSIS — M359 Systemic involvement of connective tissue, unspecified: Secondary | ICD-10-CM

## 2024-04-08 DIAGNOSIS — F321 Major depressive disorder, single episode, moderate: Secondary | ICD-10-CM

## 2024-04-08 DIAGNOSIS — R7303 Prediabetes: Secondary | ICD-10-CM | POA: Diagnosis not present

## 2024-04-08 DIAGNOSIS — E782 Mixed hyperlipidemia: Secondary | ICD-10-CM

## 2024-04-08 DIAGNOSIS — E039 Hypothyroidism, unspecified: Secondary | ICD-10-CM

## 2024-04-08 DIAGNOSIS — E6609 Other obesity due to excess calories: Secondary | ICD-10-CM

## 2024-04-08 DIAGNOSIS — I10 Essential (primary) hypertension: Secondary | ICD-10-CM

## 2024-04-08 DIAGNOSIS — I6789 Other cerebrovascular disease: Secondary | ICD-10-CM

## 2024-04-08 DIAGNOSIS — R0989 Other specified symptoms and signs involving the circulatory and respiratory systems: Secondary | ICD-10-CM | POA: Diagnosis not present

## 2024-04-08 DIAGNOSIS — E559 Vitamin D deficiency, unspecified: Secondary | ICD-10-CM

## 2024-04-08 DIAGNOSIS — Z6830 Body mass index (BMI) 30.0-30.9, adult: Secondary | ICD-10-CM

## 2024-04-08 DIAGNOSIS — K219 Gastro-esophageal reflux disease without esophagitis: Secondary | ICD-10-CM

## 2024-04-08 LAB — CBC WITH DIFFERENTIAL/PLATELET
Basophils Absolute: 0 10*3/uL (ref 0.0–0.1)
Basophils Relative: 0.8 % (ref 0.0–3.0)
Eosinophils Absolute: 0.1 10*3/uL (ref 0.0–0.7)
Eosinophils Relative: 2.8 % (ref 0.0–5.0)
HCT: 42.7 % (ref 36.0–46.0)
Hemoglobin: 13.8 g/dL (ref 12.0–15.0)
Lymphocytes Relative: 25.6 % (ref 12.0–46.0)
Lymphs Abs: 1.1 10*3/uL (ref 0.7–4.0)
MCHC: 32.4 g/dL (ref 30.0–36.0)
MCV: 88.6 fl (ref 78.0–100.0)
Monocytes Absolute: 0.5 10*3/uL (ref 0.1–1.0)
Monocytes Relative: 11.4 % (ref 3.0–12.0)
Neutro Abs: 2.7 10*3/uL (ref 1.4–7.7)
Neutrophils Relative %: 59.4 % (ref 43.0–77.0)
Platelets: 229 10*3/uL (ref 150.0–400.0)
RBC: 4.81 Mil/uL (ref 3.87–5.11)
RDW: 14.4 % (ref 11.5–15.5)
WBC: 4.5 10*3/uL (ref 4.0–10.5)

## 2024-04-08 LAB — HEMOGLOBIN A1C: Hgb A1c MFr Bld: 5.9 % (ref 4.6–6.5)

## 2024-04-08 LAB — COMPREHENSIVE METABOLIC PANEL WITH GFR
ALT: 17 U/L (ref 0–35)
AST: 21 U/L (ref 0–37)
Albumin: 4.2 g/dL (ref 3.5–5.2)
Alkaline Phosphatase: 47 U/L (ref 39–117)
BUN: 15 mg/dL (ref 6–23)
CO2: 31 meq/L (ref 19–32)
Calcium: 9.4 mg/dL (ref 8.4–10.5)
Chloride: 107 meq/L (ref 96–112)
Creatinine, Ser: 1.04 mg/dL (ref 0.40–1.20)
GFR: 57.7 mL/min — ABNORMAL LOW (ref >60.00)
Glucose, Bld: 81 mg/dL (ref 70–99)
Potassium: 4.8 meq/L (ref 3.5–5.1)
Sodium: 142 meq/L (ref 135–145)
Total Bilirubin: 0.3 mg/dL (ref 0.2–1.2)
Total Protein: 7.3 g/dL (ref 6.0–8.3)

## 2024-04-08 LAB — LIPID PANEL
Cholesterol: 162 mg/dL (ref 0–200)
HDL: 74 mg/dL (ref >39.00)
LDL Cholesterol: 77 mg/dL (ref 0–99)
NonHDL: 88.45
Total CHOL/HDL Ratio: 2
Triglycerides: 59 mg/dL (ref 0.0–149.0)
VLDL: 11.8 mg/dL (ref 0.0–40.0)

## 2024-04-08 LAB — URINALYSIS, ROUTINE W REFLEX MICROSCOPIC
Bilirubin Urine: NEGATIVE
Hgb urine dipstick: NEGATIVE
Ketones, ur: NEGATIVE
Leukocytes,Ua: NEGATIVE
Nitrite: NEGATIVE
RBC / HPF: NONE SEEN (ref 0–?)
Specific Gravity, Urine: <=1.005 — AB (ref 1.000–1.030)
Total Protein, Urine: NEGATIVE
Urine Glucose: NEGATIVE
Urobilinogen, UA: 0.2 (ref 0.0–1.0)
WBC, UA: NONE SEEN (ref 0–?)
pH: 7 (ref 5.0–8.0)

## 2024-04-08 LAB — MAGNESIUM: Magnesium: 2.2 mg/dL (ref 1.5–2.5)

## 2024-04-08 LAB — TSH: TSH: 2.01 u[IU]/mL (ref 0.35–5.50)

## 2024-04-08 LAB — VITAMIN D 25 HYDROXY (VIT D DEFICIENCY, FRACTURES): VITD: 61.98 ng/mL (ref 30.00–100.00)

## 2024-04-08 MED ORDER — ESCITALOPRAM OXALATE 20 MG PO TABS: 10.0000 mg | ORAL_TABLET | Freq: Every day | ORAL | 1 refills | Status: DC

## 2024-04-08 MED ORDER — BUPROPION HCL ER (SR) 100 MG PO TB12: 100.0000 mg | ORAL_TABLET | Freq: Every day | ORAL | 1 refills | Status: DC

## 2024-04-08 NOTE — Assessment & Plan Note (Signed)
 Controlled with Protonix , continue

## 2024-04-08 NOTE — Patient Instructions (Signed)
 DECREASE lexapro  to 1/2 tab (10mg )  START Wellbutrin SR 100mg  once daily in the morning  STOP phentermine   Follow up with me in about a month for medication re evaluation  I have placed a referral for therapy for you today as well.

## 2024-04-08 NOTE — Assessment & Plan Note (Signed)
 Decrease Lexapro  to 10 mg once daily Start Wellbutrin 100 mg SR once daily Referral to psychiatry for therapy today

## 2024-04-08 NOTE — Assessment & Plan Note (Signed)
 Head CT in 08/2021, discussed that this is a very common finding, and that there were no red flags noted on that CT. The best way to prevent progression is to control blood pressures, continue aspirin and statin, efforts in physical activity

## 2024-04-08 NOTE — Assessment & Plan Note (Signed)
Checking levels today

## 2024-04-08 NOTE — Assessment & Plan Note (Signed)
Will check levels today

## 2024-04-08 NOTE — Progress Notes (Signed)
 Established Patient Office Visit  Subjective   Patient ID: Beth Miranda, female    DOB: [birth year]  Age: 63 y.o. MRN: [id number]  No chief complaint on file.   HPI Patient presents today for medication management. At our last visit, she increased topamax  to 100mg  twice a day. States she only had a couple of days of phentermine  left after her last appointment.  So has not been taking this for the last 3 weeks. At her last visit, discussed stopping Lexapro  and starting Wellbutrin.  She would like to proceed with this today.  Reports compliance with medication regimen. She is fasting today. Denies other concerns. Medical history as outlined below.  Inquiring about diagnosis of other cerebrovascular disease on her chart from 2022.  States she was not told about this.  ROS Per HPI    Objective:     BP 136/80 (BP Location: Left Arm, Patient Position: Sitting)   Pulse 74   Temp 98.7 F (37.1 C) (Temporal)   Ht 5\' 4"  (1.626 m)   Wt 158 lb 6.4 oz (71.8 kg)   LMP 03/02/2013   SpO2 99%   BMI 27.19 kg/m   Physical Exam Vitals and nursing note reviewed.  Constitutional:      General: She is not in acute distress.    Appearance: Normal appearance.  HENT:     Head: Normocephalic and atraumatic.     Nose: Nose normal.  Eyes:     Extraocular Movements: Extraocular movements intact.  Cardiovascular:     Rate and Rhythm: Normal rate.  Pulmonary:     Effort: Pulmonary effort is normal.  Musculoskeletal:        General: Normal range of motion.     Cervical back: Normal range of motion.  Lymphadenopathy:     Cervical: No cervical adenopathy.  Skin:    General: Skin is warm and dry.  Neurological:     General: No focal deficit present.     Mental Status: She is alert and oriented to person, place, and time.  Psychiatric:        Attention and Perception: Attention normal.        Mood and Affect: Mood is anxious.        Speech: Speech normal.        Behavior: Behavior  normal. Behavior is cooperative.        Thought Content: Thought content normal.      No results found for any visits on 04/08/24.   The 10-year ASCVD risk score (Arnett DK, et al., 2019) is: 3.1%    Assessment & Plan:   Major depressive disorder, single episode, moderate with anxious distress (HCC) Assessment & Plan: Decrease Lexapro  to 10 mg once daily Start Wellbutrin 100 mg SR once daily Referral to psychiatry for therapy today  Orders: -     buPROPion HCl ER (SR); Take 1 tablet (100 mg total) by mouth daily.  Dispense: 30 tablet; Refill: 1 -     Escitalopram  Oxalate; Take 0.5 tablets (10 mg total) by mouth daily. Take  1 tablet  Daily for Mood                                 /  TAKE                BY               MOUTH                       ONCE ? ? ?  DAILY.Take  1 tablet  Daily for Mood                                 /                                                                   TAKE                BY               MOUTH                       ONCE ? ? ?  DAILY.  Dispense: 30 tablet; Refill: 1 -     Ambulatory referral to Psychiatry -     CBC with Differential/Platelet  Hypothyroidism, unspecified type Assessment & Plan: Will check levels today  Orders: -     TSH -     Urinalysis, Routine w reflex microscopic  Labile hypertension Assessment & Plan: Continue low-salt diet, controlled  Orders: -     Comprehensive metabolic panel with GFR -     CBC with Differential/Platelet  Hyperlipidemia, mixed Assessment & Plan: Will check levels today, continue Crestor   Orders: -     Hemoglobin A1c -     Lipid panel  Pre-diabetes -     Hemoglobin A1c -     Urinalysis, Routine w reflex microscopic -     Lipid panel -     CBC with Differential/Platelet  Vitamin D  deficiency Assessment & Plan: Checking levels today  Orders: -     VITAMIN D  25 Hydroxy (Vit-D Deficiency, Fractures) -      Magnesium  Disorder of connective tissue (HCC) Assessment & Plan: Continue vitamin supplementation, healthy activity level  Orders: -     Magnesium -     Urinalysis, Routine w reflex microscopic -     Lipid panel -     Comprehensive metabolic panel with GFR -     CBC with Differential/Platelet  Primary hypertension -     Urinalysis, Routine w reflex microscopic -     Lipid panel -     Comprehensive metabolic panel with GFR -     CBC with Differential/Platelet  Class 1 obesity due to excess calories with serious comorbidity and body mass index (BMI) of 30.0 to 30.9 in adult -     Lipid panel  Gastroesophageal reflux disease without esophagitis Assessment & Plan: Controlled with Protonix , continue  Orders: -     CBC with Differential/Platelet  Cerebral microvascular disease Assessment & Plan: Head CT in 08/2021, discussed that this is a very common finding, and that there were no red flags noted on that CT. The best way to prevent progression is to control blood pressures, continue aspirin and statin, efforts in physical activity  Return in about 4 weeks (around 05/06/2024) for Medication management.    Wellington Half, FNP

## 2024-04-08 NOTE — Assessment & Plan Note (Signed)
 Continue vitamin supplementation, healthy activity level

## 2024-04-08 NOTE — Assessment & Plan Note (Signed)
 Will check levels today, continue Crestor 

## 2024-04-08 NOTE — Assessment & Plan Note (Signed)
 Continue low-salt diet, controlled

## 2024-04-08 NOTE — Assessment & Plan Note (Signed)
 Controlled, continue efforts in healthy activity level

## 2024-04-20 ENCOUNTER — Encounter: Payer: Self-pay | Admitting: Family Medicine

## 2024-05-08 DIAGNOSIS — F411 Generalized anxiety disorder: Secondary | ICD-10-CM | POA: Insufficient documentation

## 2024-05-08 DIAGNOSIS — Z79899 Other long term (current) drug therapy: Secondary | ICD-10-CM | POA: Insufficient documentation

## 2024-05-08 NOTE — Progress Notes (Unsigned)
   Established Patient Office Visit  Subjective   Patient ID: Beth Miranda, female    DOB: 1961/10/12  Age: 63 y.o. MRN: [id number]  No chief complaint on file.   HPI Patient presents today for medication management and follow up of depression. Reports compliance with medication regimen. Denies the need for refills. Not fasting today. Denies other concerns. Medical history as outlined below. At our last visit: Decrease Lexapro  to 10 mg once daily Start Wellbutrin  100 mg SR once daily Referral to psychiatry for therapy today  ROS Per HPI    Objective:     LMP 03/02/2013   Physical Exam Vitals and nursing note reviewed.  Constitutional:      General: She is not in acute distress.    Appearance: Normal appearance. She is normal weight.  HENT:     Head: Normocephalic and atraumatic.     Right Ear: External ear normal.     Left Ear: External ear normal.     Nose: Nose normal.     Mouth/Throat:     Mouth: Mucous membranes are moist.     Pharynx: Oropharynx is clear.  Eyes:     Extraocular Movements: Extraocular movements intact.     Pupils: Pupils are equal, round, and reactive to light.  Cardiovascular:     Rate and Rhythm: Normal rate and regular rhythm.     Pulses: Normal pulses.     Heart sounds: Normal heart sounds.  Pulmonary:     Effort: Pulmonary effort is normal. No respiratory distress.     Breath sounds: Normal breath sounds. No wheezing, rhonchi or rales.  Musculoskeletal:        General: Normal range of motion.     Cervical back: Normal range of motion.     Right lower leg: No edema.     Left lower leg: No edema.  Lymphadenopathy:     Cervical: No cervical adenopathy.  Skin:    General: Skin is warm and dry.  Neurological:     General: No focal deficit present.     Mental Status: She is alert and oriented to person, place, and time.  Psychiatric:        Attention and Perception: Attention normal.        Mood and Affect: Mood normal.         Speech: Speech normal.        Behavior: Behavior normal. Behavior is cooperative.        Thought Content: Thought content normal.      No results found for any visits on 05/10/24.   The 10-year ASCVD risk score (Arnett DK, et al., 2019) is: 3.3%    Assessment & Plan:   Labile hypertension  Major depressive disorder, single episode, moderate with anxious distress (HCC)  Hypothyroidism, unspecified type  GAD (generalized anxiety disorder)  Medication management     No follow-ups on file.    Wellington Half, FNP

## 2024-05-10 ENCOUNTER — Ambulatory Visit: Payer: Self-pay | Admitting: Family Medicine

## 2024-05-10 ENCOUNTER — Encounter: Payer: Self-pay | Admitting: Family Medicine

## 2024-05-10 VITALS — BP 134/88 | HR 74 | Temp 98.7°F | Ht 64.0 in | Wt 164.6 lb

## 2024-05-10 DIAGNOSIS — E039 Hypothyroidism, unspecified: Secondary | ICD-10-CM | POA: Diagnosis not present

## 2024-05-10 DIAGNOSIS — Z79899 Other long term (current) drug therapy: Secondary | ICD-10-CM | POA: Diagnosis not present

## 2024-05-10 DIAGNOSIS — F411 Generalized anxiety disorder: Secondary | ICD-10-CM | POA: Diagnosis not present

## 2024-05-10 DIAGNOSIS — F321 Major depressive disorder, single episode, moderate: Secondary | ICD-10-CM | POA: Diagnosis not present

## 2024-05-10 DIAGNOSIS — R0989 Other specified symptoms and signs involving the circulatory and respiratory systems: Secondary | ICD-10-CM

## 2024-05-10 DIAGNOSIS — Z8249 Family history of ischemic heart disease and other diseases of the circulatory system: Secondary | ICD-10-CM

## 2024-05-10 MED ORDER — BUPROPION HCL ER (SR) 100 MG PO TB12: 100.0000 mg | ORAL_TABLET | Freq: Every day | ORAL | 1 refills | Status: DC

## 2024-05-10 NOTE — Assessment & Plan Note (Signed)
 Refilled Wellbutrin  today Follow-up with behavioral at the scheduled

## 2024-05-10 NOTE — Assessment & Plan Note (Signed)
 Continue current medication regimen, other than changing levothyroxine 

## 2024-05-10 NOTE — Assessment & Plan Note (Signed)
 Refilled wellbutrin  Stable

## 2024-05-10 NOTE — Assessment & Plan Note (Signed)
 TSH was 2.01, has been less than 2 over the last 2 years Discussed that she could continue levothyroxine  50 mg, but increased to 75 mg every other day to see if this helps with weight gain Follow-up in a month to reevaluate

## 2024-05-10 NOTE — Patient Instructions (Signed)
 Refilled Wellbutrin  today  Follow-up with behavioral health as scheduled  May try to increase levothyroxine  every other day to 75 mcg, take 50 mcg on opposite days, will recheck in about a month  I put in the order for coronary artery calcium  score.  Some will be reaching out to get this scheduled for you.  Will be in contact with results once they are received.  Follow-up with me in about a month

## 2024-05-18 ENCOUNTER — Encounter: Payer: Self-pay | Admitting: Gastroenterology

## 2024-05-18 ENCOUNTER — Ambulatory Visit: Admitting: Gastroenterology

## 2024-05-18 ENCOUNTER — Other Ambulatory Visit (HOSPITAL_COMMUNITY): Payer: Self-pay

## 2024-05-18 ENCOUNTER — Telehealth: Payer: Self-pay

## 2024-05-18 VITALS — BP 120/70 | HR 80 | Ht 63.25 in | Wt 162.5 lb

## 2024-05-18 DIAGNOSIS — K219 Gastro-esophageal reflux disease without esophagitis: Secondary | ICD-10-CM | POA: Diagnosis not present

## 2024-05-18 DIAGNOSIS — R1013 Epigastric pain: Secondary | ICD-10-CM

## 2024-05-18 MED ORDER — PANTOPRAZOLE SODIUM 40 MG PO TBEC: 40.0000 mg | DELAYED_RELEASE_TABLET | Freq: Two times a day (BID) | ORAL | 3 refills | Status: AC

## 2024-05-18 NOTE — Telephone Encounter (Signed)
 Pharmacy Patient Advocate Encounter   Received notification from CoverMyMeds that prior authorization for Pantoprazole  Sodium 40MG  dr tablets is required/requested.   Insurance verification completed.   The patient is insured through [facility] .   Per test claim: Refill too soon. PA is not needed at this time. Medication was filled 05-10-2024. Next eligible fill date is 06-02-2024.

## 2024-05-18 NOTE — Progress Notes (Signed)
 Chief Complaint: For GERD  Referring Provider:  Vangie Genet, MD      ASSESSMENT AND PLAN;   #1. GERD with nocturnal symptoms. #2. Epi pain (mild). Neg US  04/2023   Plan: -Continue protonix  40 BID. #180, 4RF -If with problems, GES to r/o gastroparesis. -Nonpharmacologic means of reflux control. -She wants to hold off on manometry/TIF eval currently. -FU in 1 year  Discussed long-term side effects of PPIs.  She does understand. HPI:    Beth Miranda is a 63 y.o. female  With Mixed Connective disease/Raynaud's, HTN, hypothyroidism  For FU  History of Present Illness Beth Miranda is a 63 year old female with gastroesophageal reflux disease (GERD) who presents with reflux symptoms.  She experiences reflux symptoms that are described as 'bad' but manageable with medication. Reflux is more pronounced at night, especially after consuming onions and tomato sauce. Nocturnal reflux sometimes causes her to wake up at night with coughing and regurgitation. A recent episode required her to sit up and drink milk to relieve symptoms, keeping her awake for the rest of the night.  Her current medication regimen includes Protonix , taken twice daily, and occasional use of Mylanta or Maalox for acute symptom relief. She typically takes one dose of Protonix  daily, increasing to two doses if she anticipates consuming foods that may exacerbate her symptoms. She has tried using a wedge pillow but finds it uncomfortable due to shoulder pain, preferring to stack pillows instead. She often sleeps in a reclined position to alleviate symptoms and finds sleeping upright in a recliner more comfortable and effective in preventing reflux.  She works late hours, contributing to her late-night eating habits. She reports poor sleep quality and acknowledges that stress impacts her bowel function. No known sleep apnea.    From previous notes: C/O GERD x yrs -mostly heartburn and regurgitation  (worst with onions, tomato sauce) Somewhat better with protonix  40 BID, takes TUMS prn Has been sleeping on a recliner as she would have significant nocturnal symptoms.  She is definitely somewhat better since Protonix  has been increased to twice daily.  No odynophagia or dysphagia.  No fever chills or night sweats.  No melena or hematochezia.  No significant abdominal pain.  Wt Readings from Last 3 Encounters:  05/18/24 162 lb 8 oz (73.7 kg)  05/10/24 164 lb 9.6 oz (74.7 kg)  04/08/24 158 lb 6.4 oz (71.8 kg)   Previous GI workup: US  04/2023 1. No cholelithiasis or sonographic evidence for acute cholecystitis. 2. Mildly increased hepatic parenchymal echogenicity suggestive of steatosis.  EGD 01/12/2023 - Mildly torturous esophagus.  - 2 cm hiatal hernia. - Neg Bx for HO/celiac/EoE. Eso bx did show mild reflux  Colon 06/06/2022 - Mild predominantly sigmoid diverticulosis - Non-bleeding internal hemorrhoids. - The examined portion of the ileum was normal. - The examination was otherwise normal on direct and retroflexion views. - Rpt in 10 yrs. Earlier, if with any new problems or change in family history.  Past Medical History:  Diagnosis Date   Allergy    seasonal   GERD (gastroesophageal reflux disease)    Hyperlipidemia    Labile hypertension    Lyme disease    in the 1990/s   Prediabetes    Raynaud's disease    Thyroid  disease    hypothyroid   Vitamin D  deficiency     Past Surgical History:  Procedure Laterality Date   COLONOSCOPY     FOOT SURGERY  09/28/2020   TENDON REPAIR  TONSILLECTOMY      Family History  Problem Relation Age of Onset   Arthritis Mother        Living   COPD Mother    Asthma Mother    Hypertension Father        Living   Diabetes Father    Kidney disease Father        Stones   Hyperlipidemia Father    Pancreatic cancer Paternal Grandmother    Other Brother        Deceased   Breast cancer Neg Hx    Colon cancer Neg Hx     Esophageal cancer Neg Hx    Rectal cancer Neg Hx    Stomach cancer Neg Hx    BRCA 1/2 Neg Hx     Social History   Tobacco Use   Smoking status: Former    Types: Cigarettes   Smokeless tobacco: Never  Vaping Use   Vaping status: Never Used  Substance Use Topics   Alcohol use: Yes    Comment: occasionally   Drug use: No    Current Outpatient Medications  Medication Sig Dispense Refill   aspirin 81 MG tablet Take 81 mg by mouth daily.     buPROPion  ER (WELLBUTRIN  SR) 100 MG 12 hr tablet Take 1 tablet (100 mg total) by mouth daily. 90 tablet 1   Cetirizine HCl (ZYRTEC ALLERGY PO) Take 10 mg by mouth at bedtime.     CINNAMON PO Take by mouth.     Cyanocobalamin (VITAMIN B 12 PO) Take 1,000 mcg by mouth daily.     escitalopram  (LEXAPRO ) 20 MG tablet Take 0.5 tablets (10 mg total) by mouth daily. Take  1 tablet  Daily for Mood                                 /                                                                   TAKE                BY               MOUTH                       ONCE ? ? ?  DAILY.Take  1 tablet  Daily for Mood                                 /                                                                   TAKE                BY               MOUTH  ONCE ? ? ?  DAILY. 30 tablet 1   gabapentin  (NEURONTIN ) 600 MG tablet Take 1/2 to 1 tablet 2 to 3 times daily as needed for pain. 90 tablet 5   levothyroxine  (SYNTHROID ) 50 MCG tablet Take  1 tablet  Daily  on an empty stomach with only water for 30 minutes & no Antacid meds, Calcium  or Magnesium for 4 hours & avoid Biotin                                                                                            /                                                                   TAKE                                         BY                                                 MOUTH                           ONCE  ?  ?  ?  ?   DAILY 90 tablet 0   Magnesium 400 MG TABS Take 1 tablet by mouth 2 (two)  times daily.     Omega-3 Fatty Acids (OMEGA-3 FISH OIL) 1200 MG CAPS Take 2 capsules by mouth daily.     pantoprazole  (PROTONIX ) 40 MG tablet Take 1 tablet (40 mg total) by mouth 2 (two) times daily. 180 tablet 1   rosuvastatin  (CRESTOR ) 20 MG tablet TAKE (1) TABLET BY MOUTH ONCE DAILY FOR CHOLESTEROL. 30 tablet 11   topiramate  (TOPAMAX ) 100 MG tablet Take 1 tablet (100 mg total) by mouth 2 (two) times daily. 60 tablet 1   VITAMIN D  PO Take 5,000 Int'l Units by mouth.     No current facility-administered medications for this visit.    Allergies  Allergen Reactions   Penicillins Hives   Sulfa Antibiotics Hives    Review of Systems:  neg     Physical Exam:    BP 120/70 (BP Location: Left Arm, Patient Position: Sitting, Cuff Size: Large)   Pulse 80   Ht 5' 3.25" (1.607 m)   Wt 162 lb 8 oz (73.7 kg)   LMP 03/02/2013   BMI 28.56 kg/m  Wt Readings from Last 3 Encounters:  05/18/24 162 lb 8 oz (73.7 kg)  05/10/24 164 lb 9.6 oz (74.7 kg)  04/08/24 158 lb 6.4 oz (71.8 kg)   Constitutional:  Well-developed, in no acute distress. Psychiatric: Normal mood and  affect. Behavior is normal. HEENT: Pupils normal.  Conjunctivae are normal. No scleral icterus. Cardiovascular: Normal rate, regular rhythm. No edema Pulmonary/chest: Effort normal and breath sounds normal. No wheezing, rales or rhonchi. Abdominal: Soft, nondistended. Nontender. Bowel sounds active throughout. There are no masses palpable. No hepatomegaly. Rectal: Deferred Neurological: Alert and oriented to person place and time. Skin: Skin is warm and dry. No rashes noted.  Data Reviewed: I have personally reviewed following labs and imaging studies  CBC:    Latest Ref Rng & Units 04/08/2024    9:24 AM 11/20/2023   11:14 AM 05/08/2023   10:51 AM  CBC  WBC 4.0 - 10.5 K/uL 4.5  4.0  4.4   Hemoglobin 12.0 - 15.0 g/dL 16.1  09.6  04.5   Hematocrit 36.0 - 46.0 % 42.7  41.8  41.9   Platelets 150.0 - 400.0 K/uL 229.0  250   255     CMP:    Latest Ref Rng & Units 04/08/2024    9:24 AM 11/20/2023   11:14 AM 08/14/2023   11:28 AM  CMP  Glucose 70 - 99 mg/dL 81  91  90   BUN 6 - 23 mg/dL 15  13  16    Creatinine 0.40 - 1.20 mg/dL 4.09  8.11  9.14   Sodium 135 - 145 mEq/L 142  141  142   Potassium 3.5 - 5.1 mEq/L 4.8  4.2  4.9   Chloride 96 - 112 mEq/L 107  108  107   CO2 19 - 32 mEq/L 31  29  25    Calcium  8.4 - 10.5 mg/dL 9.4  9.6  9.5   Total Protein 6.0 - 8.3 g/dL 7.3  7.1  7.0   Total Bilirubin 0.2 - 1.2 mg/dL 0.3  0.4  0.3   Alkaline Phos 39 - 117 U/L 47     AST 0 - 37 U/L 21  17  15    ALT 0 - 35 U/L 17  13  12         Magnus Schuller, MD 05/18/2024, 2:33 PM  Cc: Vangie Genet, MD

## 2024-05-18 NOTE — Patient Instructions (Addendum)
 _______________________________________________________  If your blood pressure at your visit was 140/90 or greater, please contact your primary care physician to follow up on this.  _______________________________________________________  If you are age 62 or older, your body mass index should be between 23-30. Your Body mass index is 28.56 kg/m. If this is out of the aforementioned range listed, please consider follow up with your Primary Care Provider.  If you are age 33 or younger, your body mass index should be between 19-25. Your Body mass index is 28.56 kg/m. If this is out of the aformentioned range listed, please consider follow up with your Primary Care Provider.   ________________________________________________________  The [facility] providers would like to encourage you to use MYCHART to communicate with providers for non-urgent requests or questions.  Due to long hold times on the telephone, sending your provider a message by [facility] may be a faster and more efficient way to get a response.  Please allow 48 business hours for a response.  Please remember that this is for non-urgent requests.  _______________________________________________________  We have sent the following medications to your pharmacy for you to pick up at your convenience: Protonix   Please follow up in 12 months. Give us  a call at [phone] to schedule an appointment.  Thank you,  Dr. Lajuan Pila

## 2024-05-19 ENCOUNTER — Ambulatory Visit: Admitting: Licensed Clinical Social Worker

## 2024-05-19 DIAGNOSIS — F4329 Adjustment disorder with other symptoms: Secondary | ICD-10-CM | POA: Insufficient documentation

## 2024-05-19 DIAGNOSIS — F4323 Adjustment disorder with mixed anxiety and depressed mood: Secondary | ICD-10-CM | POA: Diagnosis not present

## 2024-05-19 NOTE — Progress Notes (Signed)
 [facility] Counselor/Therapist Progress Note  Patient ID: Beth Miranda, MRN: [id number]    Date: 05/19/24  Individualized Treatment Plan Strengths: "Patient cares for her father while working and taking care of her family."  Supports: Patient reports a lack of support system."   Goal/Needs for Treatment:  In order of importance to patient 1) "I want to be able to talk about my mother without going to pieces." 2) "I would like to know how to handle family stress and anger appropriately."    Client Statement of Needs: CBT, Motivational Interviewing, DBT   Treatment Level:Moderate-Bi weekly  Symptoms: Depression and anxiety and grief related to the family situation and her mothers death due to watching her fathers health decline.  Client Treatment Preferences: Face to Face   Healthcare consumer's goal for treatment:  Therapist, Keenan Pastor MSW, LCSW will support the patient's ability to achieve the goals identified. Cognitive Behavioral Therapy, Assertive Communication/Conflict Resolution Training, Relaxation Training, ACT, Humanistic and other evidenced-based practices will be used to promote progress towards healthy functioning.   Healthcare consumer will: Actively participate in therapy, working towards healthy functioning.    *Justification for Continuation/Discontinuation of Goal: R=Revised, O=Ongoing, A=Achieved, D=Discontinued  Goal 1) "I want to be able to talk about my mother without going to pieces." Baseline date 05/19/2024: Progress towards goal Ongoing; How Often - Daily Target Date Goal Was reviewed Status Code Progress towards goal/Likert rating  05/19/2025  O Ongoing             Goal 2) "I would like to know how to handle family stress and anger appropriately." Baseline date 05/19/2024: Progress towards goal Ongoing; How Often - Daily Target Date Goal Was reviewed Status Code Progress towards goal  05/19/2025  O Ongoing             This plan has  been reviewed and created by the following participants:  This plan will be reviewed at least every 12 months. Date Behavioral Health Clinician Date Guardian/Patient   6/5/205   05/19/2024 Verbal Consent Provided                  Orbie Binder MSW, LCSW DATE: 05/19/2024

## 2024-05-19 NOTE — Progress Notes (Addendum)
 [facility] Counselor/Therapist Progress Note  Patient ID: Daneshia Tavano, MRN: [id number]    Date: 05/19/24  Time Spent: 0302  pm - 0400 pm : 58 Minutes  Treatment Type: Initial Assessment/Treatment Planning  Mental Status Exam: Appearance:  Neat     Behavior: Appropriate  Motor: Normal  Speech/Language:  Clear and Coherent  Affect: Depressed  Mood: depressed  Thought process: normal  Thought content:   WNL  Sensory/Perceptual disturbances:   WNL  Orientation: oriented to person, place, time/date, situation, day of week, month of year, and year  Attention: Good  Concentration: Good  Memory: WNL  Fund of knowledge:  Good  Insight:   Good  Judgment:  Good  Impulse Control: Good   Risk Assessment: Danger to Self:  No Self-injurious Behavior: No Danger to Others: No Duty to Warn:no Physical Aggression / Violence:No  Access to Firearms a concern: No  Gang Involvement:No   Subjective:   Abram Abraham participated from office, located at [facility] with Clinician present. Johany consented to treatment.   Presenting Problem Chief Complaint: Patient reports having depression and grief for mother who passed four years ago and now being the caregiver for her father. Anger toward family which is brother in law and his wife.   What are the main stressors in your life right now, how long? Depression  3, Anxiety   3, Appetite Change   3, Sleep Changes   3, Loss of Interest   3, and Low Energy   3   Previous mental health services Have you ever been treated for a mental health problem, when, where, by whom? No     Are you currently seeing a therapist or counselor, counselor's name? No   Have you ever had a mental health hospitalization, how many times, length of stay? No   Have you ever been treated with medication, name, reason, response? No   Have you ever had suicidal thoughts or attempted suicide, when, how? No   Risk factors for  Suicide Demographic factors:  Caucasian Current mental status: No plan to harm self or others Loss factors: NA Historical factors: NA Risk Reduction factors: Employed and Living with another person, especially a relative Clinical factors:  Severe Anxiety and/or Agitation Cognitive features that contribute to risk: NA    SUICIDE RISK:  Minimal: No identifiable suicidal ideation.  Patients presenting with no risk factors but with morbid ruminations; may be classified as minimal risk based on the severity of the depressive symptoms  Medical history Medical treatment and/or problems, explain: Yes Mixed connective tissues disease, acid reflux, high cholesterol, hypothyroidism, pre diabetes. Do you have any issues with chronic pain?  Yes From time to time and its rare. Name of primary care physician/last physical exam: Stephanie [facility]  Allergies: Yes Medication, reactions? Sulfa and penicillin    Current medications:  Benzonatate    buPROPion  HCl   Escitalopram  Oxalate   Gabapentin    Levothyroxine  Sodium   Meloxicam    Ondansetron    Pantoprazole  Sodium   Phentermine  HCl   Rosuvastatin  Calcium    Topiramate     Prescribed by: Casimer Clear   Is there any history of mental health problems or substance abuse in your family, whom? No   Has anyone in your family been hospitalized, who, where, length of stay? No NA  Social/family history Have you been married, how many times?  1  Do you have children?  0  How many pregnancies have you had?  0  Who lives in  your current household? Patient and husband  Military history: No   Religious/spiritual involvement: Previous Financial planner religion/faith base are you? Methodist  Family of origin (childhood history) Parents and patient  Where were you born? [place]  Where did you grow up? [place] and then [employer] [place]  How many different homes have you lived? 11  Describe  the atmosphere of the household where you grew up: Stable and supportive, nurturing  Do you have siblings, step/half siblings, list names, relation, sex, age? No   Are your parents separated/divorced, when and why? No Parents remained married until mothers passing in 2021  Are your parents alive? No Mother is deceased and father is alive  Social supports (personal and professional): Patient reports that she doesn't have support person.  Education How many grades have you completed? some college Did you have any problems in school, what type? No  Medications prescribed for these problems? 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   Legal history: Denied   Trauma/Abuse history: Have you ever been exposed to any form of abuse, what type? No   Have you ever been exposed to something traumatic, describe? No   Substance use Do you use Caffeine? Yes Type, frequency? I cup of coffee daily  Do you use Nicotine? No Type, frequency, ppd? NA   Do you use Alcohol? Yes Type, frequency? Thursday -Saturday 1 drink per night-bourbon  How old were you went you first tasted alcohol? 20   Was this accepted by your family? Yes  When was your last drink, type, how much? Saturday one glass of bourbon  Have you ever used illicit drugs or taken more than prescribed, type, frequency, date of last usage? No NA  Mental Status: General Appearance Gene Kemps:  Neat Eye Contact:  Good Motor Behavior:  Normal Speech:  Normal Level of Consciousness:  Alert Mood:  Depressed Affect:  Depressed Anxiety Level:  Minimal Thought Process:  Coherent Thought Content:  WNL Perception:  Normal Judgment:  Good Insight:  Present Cognition:  Orientation time, place, and person  Diagnosis AXIS I Adjustment Disorder with Mixed Emotional Features  AXIS II No diagnosis  AXIS III Pre diabetes, hypothyroidism, cholesterol, mixed  connective tissue disease   AXIS IV other psychosocial or environmental problems  AXIS V 51-60 moderate symptoms     Interventions: Cognitive Behavioral Therapy, Dialectical Behavioral Therapy, Assertiveness/Communication, Motivational Interviewing, and Grief Therapy  Diagnosis: Adjustment Disorder with mixed depression and anxiety.   Keenan Pastor MSW, LCSW/DATE 05/19/2024

## 2024-05-24 ENCOUNTER — Ambulatory Visit (HOSPITAL_COMMUNITY)
Admission: RE | Admit: 2024-05-24 | Discharge: 2024-05-24 | Disposition: A | Discharge status: Home / Self Care | Source: Ambulatory Visit | Attending: Cardiovascular Disease | Admitting: Cardiovascular Disease

## 2024-05-24 DIAGNOSIS — Z8249 Family history of ischemic heart disease and other diseases of the circulatory system: Secondary | ICD-10-CM | POA: Insufficient documentation

## 2024-05-24 DIAGNOSIS — Z136 Encounter for screening for cardiovascular disorders: Secondary | ICD-10-CM | POA: Insufficient documentation

## 2024-05-24 DIAGNOSIS — Z79899 Other long term (current) drug therapy: Secondary | ICD-10-CM | POA: Insufficient documentation

## 2024-05-24 DIAGNOSIS — I700 Atherosclerosis of aorta: Secondary | ICD-10-CM | POA: Insufficient documentation

## 2024-05-26 ENCOUNTER — Ambulatory Visit: Payer: Self-pay | Admitting: Family Medicine

## 2024-06-02 ENCOUNTER — Ambulatory Visit: Admitting: Licensed Clinical Social Worker

## 2024-06-02 DIAGNOSIS — F4329 Adjustment disorder with other symptoms: Secondary | ICD-10-CM

## 2024-06-07 ENCOUNTER — Telehealth: Payer: Self-pay | Admitting: Family Medicine

## 2024-06-07 NOTE — Progress Notes (Signed)
 [facility] Counselor/Therapist Progress Note  Patient ID: Beth Miranda, MRN: [id number]    Date: 06/02/2024  Time Spent: 0405  pm - 0503 pm : 57 Minutes  Treatment Type: Individual Therapy.  Reported Symptoms:  Mental Status Exam: Appearance:  Neat     Behavior: Appropriate  Motor: Normal  Speech/Language:  Clear and Coherent  Affect: Depressed  Mood: depressed  Thought process: normal  Thought content:   WNL  Sensory/Perceptual disturbances:   WNL  Orientation: oriented to person, place, time/date, situation, day of week, month of year, and year  Attention: Good  Concentration: Good  Memory: WNL  Fund of knowledge:  Good  Insight:   Good  Judgment:  Good  Impulse Control: Good    Risk Assessment: Danger to Self:  No Self-injurious Behavior: No Danger to Others: No Duty to Warn:no Physical Aggression / Violence:No  Access to Firearms a concern: No  Gang Involvement:No    Subjective:    [name] participated from office, located at [facility] with Clinician present. Lourdes consented to treatment.    Kenadie presented for her session in a more positive mood than her last session. Tearsa reports that she has worked on changing her thought process. Sharian reports that she has reminded herself that she cannot change her sister-in law and addressing it with her in anger will only make things worse. She also reports that she has had to accept that her husband doesn't pay any attention to the sister in laws behavior because he knows how she has been her whole life. She states that it is just frustrating that her spouse won't discuss things with her.   Clinician actively listened and provided support via verbal feedback. Clinician processed with patient that reminding herself that most people who know how her sister in law is, are aware of how she talks about others. They likely too don't pay her any mind and take what she says with a grain of salt.  Clinician encouraged patient to talk with her spouse about how she feels and she needs him to at least listen to her when she finds herself frustrated at the overall situation.  Aleisa was actively involved in discussion and was goal oriented in her interaction with Clinician. Patient was more positive during this session and utilizing problem solving techniques to cope with her family situation. Brentney will continue to utilize coping skills to assist with her symptoms. Kishana will continue to engage in bi weekly therapy sessions. Treatment planning will be reviewed by 05/19/2025.  Interventions: Cognitive Behavioral Therapy, Assertiveness/Communication, and Solution-Oriented/Positive Psychology  Diagnosis: Adjustment Disorder with mixed emotional features.    Damien Junk MSW, LCSW/DATE 06/02/2024

## 2024-06-07 NOTE — Telephone Encounter (Unsigned)
 Copied from CRM [id number]. Topic: Clinical - Medical Advice >> Jun 07, 2024 10:03 AM [name] wrote: Reason for CRM: Patient called stating that she received a appointment card from the office that she was schedule for 06/16/24 at 3:00pm that she wants to reschedule. Advise patient that I do not see the appointment and can schedule for a different date. Patient wants a call back to discuss the appointment on 06/16/24 at [phone].

## 2024-06-09 NOTE — Progress Notes (Deleted)
   Established Patient Office Visit  Subjective   Patient ID: Beth Miranda, female    DOB: [birth year]  Age: 64 y.o. MRN: [id number]  No chief complaint on file.   HPI Patient presents today for medication management. Reports compliance with medication regimen. Denies the need for refills. Not fasting today. Denies other concerns. Medical history as outlined below.  ROS Per HPI    Objective:     LMP 03/02/2013   Physical Exam   No results found for any visits on 06/10/24.   The 10-year ASCVD risk score (Arnett DK, et al., 2019) is: 2.6%    Assessment & Plan:   Disorder of connective tissue (HCC)  GAD (generalized anxiety disorder)  Major depressive disorder, single episode, moderate with anxious distress (HCC)  Medication management     No follow-ups on file.    Corean LITTIE Ku, FNP

## 2024-06-10 ENCOUNTER — Ambulatory Visit: Payer: Self-pay | Admitting: Family Medicine

## 2024-06-10 ENCOUNTER — Encounter: Payer: Self-pay | Admitting: Family Medicine

## 2024-06-10 VITALS — BP 124/80 | HR 74 | Temp 98.3°F | Ht 63.5 in | Wt 163.0 lb

## 2024-06-10 DIAGNOSIS — M359 Systemic involvement of connective tissue, unspecified: Secondary | ICD-10-CM | POA: Diagnosis not present

## 2024-06-10 DIAGNOSIS — F321 Major depressive disorder, single episode, moderate: Secondary | ICD-10-CM

## 2024-06-10 DIAGNOSIS — E782 Mixed hyperlipidemia: Secondary | ICD-10-CM

## 2024-06-10 DIAGNOSIS — F411 Generalized anxiety disorder: Secondary | ICD-10-CM | POA: Diagnosis not present

## 2024-06-10 DIAGNOSIS — E039 Hypothyroidism, unspecified: Secondary | ICD-10-CM

## 2024-06-10 DIAGNOSIS — Z79899 Other long term (current) drug therapy: Secondary | ICD-10-CM

## 2024-06-10 MED ORDER — ROSUVASTATIN CALCIUM 20 MG PO TABS: ORAL_TABLET | ORAL | 11 refills | Status: AC

## 2024-06-10 MED ORDER — ESCITALOPRAM OXALATE 5 MG PO TABS: 5.0000 mg | ORAL_TABLET | Freq: Every day | ORAL | 0 refills | Status: DC

## 2024-06-10 MED ORDER — LEVOTHYROXINE SODIUM 50 MCG PO TABS: 50.0000 ug | ORAL_TABLET | Freq: Every day | ORAL | 5 refills | Status: DC

## 2024-06-10 MED ORDER — LEVOTHYROXINE SODIUM 50 MCG PO TABS: 100.0000 ug | ORAL_TABLET | Freq: Every day | ORAL | 5 refills | Status: AC

## 2024-06-10 MED ORDER — BUPROPION HCL ER (SR) 100 MG PO TB12: 100.0000 mg | ORAL_TABLET | Freq: Every day | ORAL | 5 refills | Status: AC

## 2024-06-10 NOTE — Patient Instructions (Addendum)
 Lexapro  5mg  once daily for the next month, then stop  Continue wellbutrin   Continue therapy  Follow up with me in 6 months for physical

## 2024-06-10 NOTE — Progress Notes (Unsigned)
 Established Patient Office Visit  Subjective   Patient ID: Beth Miranda, female    DOB: Aug 10, 1961  Age: 63 y.o. MRN: [id number]  Chief Complaint  Patient presents with   Follow-up    Patient here for 1 month f/u. She did see her therapist, she did have 2 visits so far and states it has been going well. Patient also needs medication refills.    HPI  Patient presents for follow-up. She states that she feel well overall and has noticed improvement in her mood. Reports that she has started seeing a behavioral therapist and feels that she is making progress. States she is tolerating Wellbutrin  well and is requesting to decrease dose or eliminate escitalopram . She reports that the weight gain she has experienced from the escitalopram  is concerning.   Requesting refills of Wellbutrin , levothyroxine  and Crestor  today.  Currently taking half tablet of 20 mg escitalopram  once daily.  Denies SI, HI.     ROS  See HPI   Objective:     BP 124/80 (BP Location: Left Arm, Patient Position: Sitting, Cuff Size: Normal)   Pulse 74   Temp 98.3 F (36.8 C) (Oral)   Ht 5' 3.5 (1.613 m)   Wt 163 lb (73.9 kg)   LMP 03/02/2013   SpO2 99%   BMI 28.42 kg/m    Physical Exam Vitals and nursing note reviewed.  Constitutional:      Appearance: Normal appearance.  HENT:     Head: Normocephalic and atraumatic.   Cardiovascular:     Rate and Rhythm: Normal rate and regular rhythm.     Pulses: Normal pulses.     Heart sounds: Normal heart sounds.  Pulmonary:     Effort: Pulmonary effort is normal.     Breath sounds: Normal breath sounds.   Skin:    General: Skin is warm and dry.     Capillary Refill: Capillary refill takes less than 2 seconds.   Neurological:     Mental Status: She is alert and oriented to person, place, and time.   Psychiatric:        Mood and Affect: Mood normal.        Behavior: Behavior normal.    No results found for any visits on 06/10/24.    The  10-year ASCVD risk score (Arnett DK, et al., 2019) is: 2.8%    Assessment & Plan:   Problem List Items Addressed This Visit     Hyperlipidemia, mixed   Refilled Crestor       Relevant Medications   rosuvastatin  (CRESTOR ) 20 MG tablet   Hypothyroidism   Feeling well taking levothyroxine  50 mg, 1 tablet every other day, 1-1/2 tablets every other day  Will refill enough for her to have 2 tablets of 50 mg/day      Relevant Medications   levothyroxine  (SYNTHROID ) 50 MCG tablet   Other Relevant Orders   TSH   Disorder of connective tissue (HCC) - Primary   Stable       Major depressive disorder, single episode, moderate with anxious distress (HCC)   Decrease Lexapro  to 5 mg once daily for 1 month then may discontinue Lexapro  Refilled Wellbutrin       Relevant Medications   buPROPion  ER (WELLBUTRIN  SR) 100 MG 12 hr tablet   escitalopram  (LEXAPRO ) 5 MG tablet   GAD (generalized anxiety disorder)   Take Lexapro  5 mg once daily for the next month then may discontinue the Lexapro  Refilled Wellbutrin   Relevant Medications   buPROPion  ER (WELLBUTRIN  SR) 100 MG 12 hr tablet   escitalopram  (LEXAPRO ) 5 MG tablet   Medication management   Decrease Lexapro  to 5 mg once daily for a month, then discontinue Refill Wellbutrin  Refilled rosuvastatin  Refilled levothyroxine  TSH today will dose adjust as needed      Relevant Medications   buPROPion  ER (WELLBUTRIN  SR) 100 MG 12 hr tablet   rosuvastatin  (CRESTOR ) 20 MG tablet   escitalopram  (LEXAPRO ) 5 MG tablet   levothyroxine  (SYNTHROID ) 50 MCG tablet   Other Relevant Orders   TSH   Continue bupropion  as prescribed. Continue with behavioral therapy. Will decrease the escitalopram  to 5mg  daily with a plan to discontinue after 1 month at 5mg  if tolerating well.   Return for CPE after 11/19/24. Return sooner if needed.    Corean LITTIE Ku, FNP

## 2024-06-11 NOTE — Assessment & Plan Note (Signed)
 Take Lexapro  5 mg once daily for the next month then may discontinue the Lexapro  Refilled Wellbutrin 

## 2024-06-11 NOTE — Assessment & Plan Note (Signed)
 Feeling well taking levothyroxine  50 mg, 1 tablet every other day, 1-1/2 tablets every other day  Will refill enough for her to have 2 tablets of 50 mg/day

## 2024-06-11 NOTE — Assessment & Plan Note (Signed)
Refilled Crestor. 

## 2024-06-11 NOTE — Assessment & Plan Note (Signed)
 Decrease Lexapro  to 5 mg once daily for 1 month then may discontinue Lexapro  Refilled Wellbutrin 

## 2024-06-11 NOTE — Assessment & Plan Note (Signed)
 Stable

## 2024-06-11 NOTE — Assessment & Plan Note (Signed)
 Decrease Lexapro  to 5 mg once daily for a month, then discontinue Refill Wellbutrin  Refilled rosuvastatin  Refilled levothyroxine  TSH today will dose adjust as needed

## 2024-06-28 ENCOUNTER — Ambulatory Visit: Admitting: Licensed Clinical Social Worker

## 2024-07-04 ENCOUNTER — Ambulatory Visit: Admitting: Obstetrics and Gynecology

## 2024-07-11 NOTE — Progress Notes (Unsigned)
 63 y.o. G0P0000 Married Caucasian female here for annual exam.  Has been having a sense of urinary urgency for the past 4 months.    Having urinary incontinence, related to urgency.   She starts to dribble and then is able to get to the bathroom.  The urgency occurs 2 - 3 times a day.  No dysuria or hematuria.  Not up at night to void.  2 cups coffee per day.    Denies vaginal bleeding.   Having family issues.  Mother has passed away and she cares for her father who has declining in health. Now seeing a therapist.  On medication and weaning off Lexapro  and using Wellbutrin  in its place.   Has a large vegetable garden.   PCP: Alvia Corean CROME, FNP   Patient's last menstrual period was 03/02/2013.           Sexually active: No  The current method of family planning is post menopausal status.    Menopausal hormone therapy:  n/a Exercising: Yes.    Gardening  Smoker:  Former   OB History  Gravida Para Term Preterm AB Living  0 0 0 0 0 0  SAB IAB Ectopic Multiple Live Births  0 0 0 0 0     HEALTH MAINTENANCE: Last 2 paps:  05/15/22 neg, 05/10/19 neg History of abnormal Pap or positive HPV:  no Mammogram:   10/23/23 Breast density Cat D, BIRADS Cat 1 neg  Colonoscopy:  06/06/22 - Due in 2033 Bone Density:  05/22/20  Result  low bone mass   Immunization History  Administered Date(s) Administered   Hepatitis B 06/06/2002, 07/07/2002, 12/29/2002   Influenza Inj Mdck Quad With Preservative 11/28/2020   Influenza,inj,Quad PF,6+ Mos 10/31/2022   Influenza-Unspecified 10/14/2018, 11/02/2023   PFIZER(Purple Top)SARS-COV-2 Vaccination 02/19/2020, 03/11/2020   PPD Test 05/09/2014, 05/10/2015, 06/04/2016, 07/01/2017, 07/26/2018, 08/15/2019, 08/30/2020, 09/18/2021, 10/31/2022   Pneumococcal-Unspecified 12/15/1992   Td 01/06/2007   Tdap 07/01/2017   Zoster Recombinant(Shingrix) 03/01/2018, 05/21/2018      reports that she has quit smoking. Her smoking use included cigarettes.  She has never used smokeless tobacco. She reports current alcohol use. She reports that she does not use drugs.  Past Medical History:  Diagnosis Date   Allergy    seasonal   GERD (gastroesophageal reflux disease)    Hyperlipidemia    Labile hypertension    Lyme disease    in the 1990/s   Prediabetes    Raynaud's disease    Thyroid  disease    hypothyroid   Vitamin D  deficiency     Past Surgical History:  Procedure Laterality Date   COLONOSCOPY     FOOT SURGERY  09/28/2020   TENDON REPAIR    TONSILLECTOMY      Current Outpatient Medications  Medication Sig Dispense Refill   aspirin 81 MG tablet Take 81 mg by mouth daily.     buPROPion  ER (WELLBUTRIN  SR) 100 MG 12 hr tablet Take 1 tablet (100 mg total) by mouth daily. 31 tablet 5   Cetirizine HCl (ZYRTEC ALLERGY PO) Take 10 mg by mouth at bedtime.     CINNAMON PO Take by mouth.     Cyanocobalamin (VITAMIN B 12 PO) Take 1,000 mcg by mouth daily.     escitalopram  (LEXAPRO ) 5 MG tablet Take 1 tablet (5 mg total) by mouth daily. 30 tablet 0   gabapentin  (NEURONTIN ) 600 MG tablet Take 1/2 to 1 tablet 2 to 3 times daily as needed for pain.  90 tablet 5   levothyroxine  (SYNTHROID ) 50 MCG tablet Take 2 tablets (100 mcg total) by mouth daily before breakfast. Take  1 tablet  Daily  on an empty stomach with only water for 30 minutes & no Antacid meds, Calcium  or Magnesium for 4 hours & avoid Biotin                                                                                            /                                                                   TAKE                                         BY                                                 MOUTH                           ONCE  ?  ?  ?  ?   DAILY 31 tablet 5   Magnesium 400 MG TABS Take 1 tablet by mouth 2 (two) times daily.     pantoprazole  (PROTONIX ) 40 MG tablet Take 1 tablet (40 mg total) by mouth 2 (two) times daily. 180 tablet 3   rosuvastatin  (CRESTOR ) 20 MG tablet TAKE (1)  TABLET BY MOUTH ONCE DAILY FOR CHOLESTEROL. 30 tablet 11   VITAMIN D  PO Take 5,000 Int'l Units by mouth.     Omega-3 Fatty Acids (OMEGA-3 FISH OIL) 1200 MG CAPS Take 2 capsules by mouth daily. (Patient not taking: Reported on 07/12/2024)     topiramate  (TOPAMAX ) 100 MG tablet Take 1 tablet (100 mg total) by mouth 2 (two) times daily. (Patient not taking: Reported on 07/12/2024) 60 tablet 1   No current facility-administered medications for this visit.    ALLERGIES: Penicillins and Sulfa antibiotics  Family History  Problem Relation Age of Onset   Arthritis Mother        Living   COPD Mother    Asthma Mother    Hypertension Father        Living   Diabetes Father    Kidney disease Father        Stones   Hyperlipidemia Father    Pancreatic cancer Paternal Grandmother    Other Brother        Deceased   Breast cancer Neg Hx    Colon cancer Neg Hx    Esophageal cancer Neg Hx    Rectal cancer Neg Hx    Stomach cancer  Neg Hx    BRCA 1/2 Neg Hx     Review of Systems  All other systems reviewed and are negative.   PHYSICAL EXAM:  BP 120/76 (BP Location: Left Arm, Patient Position: Sitting)   Pulse 80   Ht 5' 5.5 (1.664 m)   Wt 165 lb (74.8 kg)   LMP 03/02/2013   SpO2 99%   BMI 27.04 kg/m     General appearance: alert, cooperative and appears stated age Head: normocephalic, without obvious abnormality, atraumatic Neck: no adenopathy, supple, symmetrical, trachea midline and thyroid  normal to inspection and palpation Lungs: clear to auscultation bilaterally Breasts: normal appearance, no masses or tenderness, No nipple retraction or dimpling, No nipple discharge or bleeding, No axillary adenopathy Heart: regular rate and rhythm Abdomen: soft, non-tender; no masses, no organomegaly Extremities: extremities normal, atraumatic, no cyanosis or edema Skin: skin color, texture, turgor normal. No rashes or lesions Lymph nodes: cervical, supraclavicular, and axillary nodes  normal. Neurologic: grossly normal  Pelvic: External genitalia:  no lesions              No abnormal inguinal nodes palpated.              Urethra:  normal appearing urethra with no masses, tenderness or lesions              Bartholins and Skenes: normal                 Vagina: normal appearing vagina with normal color and discharge, no lesions              Cervix: no lesions              Pap taken: no Bimanual Exam:  Uterus:  normal size, contour, position, consistency, mobility, non-tender              Adnexa: no mass, fullness, tenderness              Rectal exam: yes.  Confirms.              Anus:  normal sphincter tone, no lesions  Chaperone was present for exam:  Geni Pica, CMA  ASSESSMENT: Well woman visit with gynecologic exam. Osteopenia.  Multiple sites. Urinary urgency.   PHQ-2-9: 6.  On Wellbutrin  and Lexapro .  PLAN: Mammogram screening discussed. Self breast awareness reviewed. Pap and HRV collected:  no.  Due in 2026.  Guidelines for Calcium , Vitamin D , regular exercise program including cardiovascular and weight bearing exercise. Medication refills:  NA Labs with PCP.  BMD at [facility].  We discussed bladder irritants.  If symptoms persist, she will reach out to me.  We discussed tx options of physical therapy and/or medication.  Follow up:  yearly and prn.

## 2024-07-12 ENCOUNTER — Encounter: Payer: Self-pay | Admitting: Obstetrics and Gynecology

## 2024-07-12 ENCOUNTER — Ambulatory Visit (INDEPENDENT_AMBULATORY_CARE_PROVIDER_SITE_OTHER): Admitting: Obstetrics and Gynecology

## 2024-07-12 VITALS — BP 120/76 | HR 80 | Ht 65.5 in | Wt 165.0 lb

## 2024-07-12 DIAGNOSIS — Z01419 Encounter for gynecological examination (general) (routine) without abnormal findings: Secondary | ICD-10-CM

## 2024-07-12 DIAGNOSIS — Z1331 Encounter for screening for depression: Secondary | ICD-10-CM

## 2024-07-12 DIAGNOSIS — M8589 Other specified disorders of bone density and structure, multiple sites: Secondary | ICD-10-CM | POA: Diagnosis not present

## 2024-07-12 NOTE — Patient Instructions (Addendum)
 Phone number for scheduling bone density at [facility], [phone].    EXERCISE AND DIET:  We recommended that you start or continue a regular exercise program for good health. Regular exercise means any activity that makes your heart beat faster and makes you sweat.  We recommend exercising at least 30 minutes per day at least 3 days a week, preferably 4 or 5.  We also recommend a diet low in fat and sugar.  Inactivity, poor dietary choices and obesity can cause diabetes, heart attack, stroke, and kidney damage, among others.    ALCOHOL AND SMOKING:  Women should limit their alcohol intake to no more than 7 drinks/beers/glasses of wine (combined, not each!) per week. Moderation of alcohol intake to this level decreases your risk of breast cancer and liver damage. And of course, no recreational drugs are part of a healthy lifestyle.  And absolutely no smoking or even second hand smoke. Most people know smoking can cause heart and lung diseases, but did you know it also contributes to weakening of your bones? Aging of your skin?  Yellowing of your teeth and nails?  CALCIUM AND VITAMIN D:  Adequate intake of calcium and Vitamin D are recommended.  The recommendations for exact amounts of these supplements seem to change often, but generally speaking 600 mg of calcium (either carbonate or citrate) and 800 units of Vitamin D per day seems prudent. Certain women may benefit from higher intake of Vitamin D.  If you are among these women, your doctor will have told you during your visit.    PAP SMEARS:  Pap smears, to check for cervical cancer or precancers,  have traditionally been done yearly, although recent scientific advances have shown that most women can have pap smears less often.  However, every woman still should have a physical exam from her gynecologist every year. It will include a breast check, inspection of the vulva and vagina to check for abnormal growths or skin changes, a visual exam of the  cervix, and then an exam to evaluate the size and shape of the uterus and ovaries.  And after 62 years of age, a rectal exam is indicated to check for rectal cancers. We will also provide age appropriate advice regarding health maintenance, like when you should have certain vaccines, screening for sexually transmitted diseases, bone density testing, colonoscopy, mammograms, etc.   MAMMOGRAMS:  All women over 62 years old should have a yearly mammogram. Many facilities now offer a "3D" mammogram, which may cost around $50 extra out of pocket. If possible,  we recommend you accept the option to have the 3D mammogram performed.  It both reduces the number of women who will be called back for extra views which then turn out to be normal, and it is better than the routine mammogram at detecting truly abnormal areas.    COLONOSCOPY:  Colonoscopy to screen for colon cancer is recommended for all women at age 62.  We know, you hate the idea of the prep.  We agree, BUT, having colon cancer and not knowing it is worse!!  Colon cancer so often starts as a polyp that can be seen and removed at colonscopy, which can quite literally save your life!  And if your first colonoscopy is normal and you have no family history of colon cancer, most women don't have to have it again for 10 years.  Once every ten years, you can do something that may end up saving your life, right?  We will  be happy to help you get it scheduled when you are ready.  Be sure to check your insurance coverage so you understand how much it will cost.  It may be covered as a preventative service at no cost, but you should check your particular policy.

## 2024-07-18 ENCOUNTER — Ambulatory Visit: Admitting: Licensed Clinical Social Worker

## 2024-07-18 DIAGNOSIS — F4329 Adjustment disorder with other symptoms: Secondary | ICD-10-CM

## 2024-07-18 DIAGNOSIS — F4325 Adjustment disorder with mixed disturbance of emotions and conduct: Secondary | ICD-10-CM | POA: Diagnosis not present

## 2024-07-18 NOTE — Progress Notes (Unsigned)
 [facility] Counselor/Therapist Progress Note  Patient ID: Beth Miranda, MRN: [id number]    Date: 07/18/24  Time Spent: 0400  am - 0459 am : 59 Minutes  Treatment Type: Individual Therapy.  Reported Symptoms: atient reports having depression and grief for mother who passed four years ago and now being the caregiver for her father. Anger toward family which is brother in law and his wife.   Mental Status Exam: Appearance:  Neat     Behavior: Appropriate  Motor: Normal  Speech/Language:  Clear and Coherent  Affect: Depressed  Mood: depressed  Thought process: normal  Thought content:   WNL  Sensory/Perceptual disturbances:   WNL  Orientation: oriented to person, place, time/date, situation, day of week, month of year, and year  Attention: Good  Concentration: Good  Memory: WNL  Fund of knowledge:  Good  Insight:   Good  Judgment:  Good  Impulse Control: Good    Risk Assessment: Danger to Self:  No Self-injurious Behavior: No Danger to Others: No Duty to Warn:no Physical Aggression / Violence:No  Access to Firearms a concern: No  Gang Involvement:No    Subjective:    Beth Miranda participated from office, located at [facility] with Clinician present. Beth Miranda consented to treatment.   Beth Miranda presented for her session in a positive mood. She reports that there has been a few issues since out last meeting. She reports that her husband had words with his brother and told him the way it was with the gossip and trouble that his wife has caused in the family. Beth Miranda reports that her husband cook for his aging mother and they made a negative comment about the food. She states that he told them they can cook from now on and he stopped doing it. She states that she actually cried that after all these years he stood up to them and let them know they know the negative energy they bring to the family.   Clinician actively listened and processed with patient her  thoughts and feelings about how this situation has affected her and her daily life. Patient states that it is easier for her not to be so angry and upset now because it bothered her that they would say and do the things they did and feel like they got by with it. Clinician processed that by remaining quiet now and not interacting with them will hopefully decrease patients depression and anxiety. We discussed the importance of keeping ourselves in check so not to respond to others negativity.    Beth Miranda was fully engaged in discussion and was motivated for treatment. Patient will continue to utilize coping skills to cope with adjustment disorder with mixed emotional features. Beth Miranda will continue to engage in bi weekly Cognitive behavioral therapy. Treatment planning will be reviewed by 05/19/2025  Interventions: Cognitive Behavioral Therapy, Dialectical Behavioral Therapy, Assertiveness/Communication, Motivational Interviewing, and Solution-Oriented/Positive Psychology  Diagnosis: Adjustment Disorder with mixed emotional features    Damien Junk MSW, LCSW/DATE 07/18/2024

## 2024-07-19 ENCOUNTER — Ambulatory Visit: Payer: Self-pay | Admitting: Family Medicine

## 2024-07-19 ENCOUNTER — Encounter: Payer: Self-pay | Admitting: Family Medicine

## 2024-07-19 VITALS — BP 132/82 | HR 80 | Temp 98.1°F | Ht 65.5 in | Wt 172.2 lb

## 2024-07-19 DIAGNOSIS — E039 Hypothyroidism, unspecified: Secondary | ICD-10-CM

## 2024-07-19 DIAGNOSIS — F321 Major depressive disorder, single episode, moderate: Secondary | ICD-10-CM

## 2024-07-19 DIAGNOSIS — E782 Mixed hyperlipidemia: Secondary | ICD-10-CM

## 2024-07-19 DIAGNOSIS — Z23 Encounter for immunization: Secondary | ICD-10-CM

## 2024-07-19 NOTE — Progress Notes (Signed)
 Subjective:    Patient ID: Beth Miranda, female    DOB: [birth year], 63 y.o.   MRN: [id number]  Foot Pain   Patient is a very pleasant 63 year old Caucasian female who has history of hypertension as well as hyperlipidemia.  The patient had a coronary artery calcium  score in June.  She scored 85th percentile for age and gender.  She is currently on Crestor .  She also has a history of hypothyroidism for which she is currently on levothyroxine .  Her TSH was checked in April and was normal.  She also has a history of mixed connective tissue disorder.  At one point she was on Plaquenil and Neurontin .  However she has been pain-free for quite some time and no longer sees a neurologist.  She also has a history of depression and she recently switched from Lexapro  to Wellbutrin  due to to gain.  She feels that Wellbutrin  is doing well.  Her mammogram is due in November.  She had a colonoscopy in 2023 and is due again in 2033.  She had a bone density test in 2021 and is due again in 2026.  Her Pap smear was performed by her gynecologist.  Reviewing her immunizations she is due for a Prevnar 20.  Shingrix is up-to-date.  She is also due for flu shot and a COVID shot in the fall.  She complains of pain in the dorsum of her left foot.  The pain is primarily in the midfoot near the tarsometatarsal junction.  She also has some pain and swelling over the distal fibula.  She has a history of a peroneal tear and had surgery to repair that.  However she denies any injury to the foot that she is aware of.  She is able to plantarflex her ankle and invert and evert the ankle without pain Past Medical History:  Diagnosis Date   Allergy    seasonal   GERD (gastroesophageal reflux disease)    Hyperlipidemia    Labile hypertension    Lyme disease    in the 1990/s   Prediabetes    Raynaud's disease    Thyroid  disease    hypothyroid   Vitamin D  deficiency    Past Surgical History:  Procedure Laterality Date    COLONOSCOPY     FOOT SURGERY  09/28/2020   TENDON REPAIR    TONSILLECTOMY     Current Outpatient Medications on File Prior to Visit  Medication Sig Dispense Refill   aspirin 81 MG tablet Take 81 mg by mouth daily.     buPROPion  ER (WELLBUTRIN  SR) 100 MG 12 hr tablet Take 1 tablet (100 mg total) by mouth daily. 31 tablet 5   Cetirizine HCl (ZYRTEC ALLERGY PO) Take 10 mg by mouth at bedtime.     CINNAMON PO Take by mouth.     Cyanocobalamin (VITAMIN B 12 PO) Take 1,000 mcg by mouth daily.     gabapentin  (NEURONTIN ) 600 MG tablet Take 1/2 to 1 tablet 2 to 3 times daily as needed for pain. 90 tablet 5   levothyroxine  (SYNTHROID ) 50 MCG tablet Take 2 tablets (100 mcg total) by mouth daily before breakfast. Take  1 tablet  Daily  on an empty stomach with only water for 30 minutes & no Antacid meds, Calcium  or Magnesium for 4 hours & avoid Biotin                                                                                            /  TAKE                                         BY                                                 MOUTH                           ONCE  ?  ?  ?  ?   DAILY 31 tablet 5   Magnesium 400 MG TABS Take 1 tablet by mouth 2 (two) times daily.     Omega-3 Fatty Acids (OMEGA-3 FISH OIL) 1200 MG CAPS Take 2 capsules by mouth daily. (Patient not taking: Reported on 07/12/2024)     pantoprazole  (PROTONIX ) 40 MG tablet Take 1 tablet (40 mg total) by mouth 2 (two) times daily. 180 tablet 3   rosuvastatin  (CRESTOR ) 20 MG tablet TAKE (1) TABLET BY MOUTH ONCE DAILY FOR CHOLESTEROL. 30 tablet 11   VITAMIN D  PO Take 5,000 Int'l Units by mouth.     No current facility-administered medications on file prior to visit.   Allergies  Allergen Reactions   Penicillins Hives   Sulfa Antibiotics Hives   Social History   Socioeconomic History   Marital status: Married    Spouse name: Not on file   Number of children: Not on  file   Years of education: Not on file   Highest education level: Not on file  Occupational History   Not on file  Tobacco Use   Smoking status: Former    Types: Cigarettes   Smokeless tobacco: Never  Vaping Use   Vaping status: Never Used  Substance and Sexual Activity   Alcohol use: Yes    Comment: occasionally   Drug use: No   Sexual activity: Not Currently    Birth control/protection: Post-menopausal    Comment: 1st intercourse 63 yo-Fewer than 5 partners  Other Topics Concern   Not on file  Social History Narrative   Lives with husband in a one story home with basement.  No children.     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   Social Drivers of Corporate investment banker Strain: Not on file  Food Insecurity: Not on file  Transportation Needs: Not on file  Physical Activity: Not on file  Stress: Not on file  Social Connections: Not on file  Intimate Partner Violence: Not on file      Review of Systems  All other systems reviewed and are negative.      Objective:   Physical Exam Vitals reviewed.  Constitutional:      Appearance: Normal appearance. She is normal weight.  Cardiovascular:     Rate and Rhythm: Normal rate and regular rhythm.     Pulses:          Dorsalis pedis pulses are 2+ on the right side and 2+ on the left side.       Posterior tibial pulses are 2+ on the right side and 2+ on the left side.  Pulmonary:     Effort: Pulmonary effort is normal. No respiratory distress.     Breath sounds:  Normal breath sounds. No wheezing or rhonchi.  Musculoskeletal:     Right foot: Normal range of motion. No deformity or prominent metatarsal heads.     Left foot: Normal range of motion. No deformity or prominent metatarsal heads.       Feet:  Feet:     Right foot:     Skin integrity: No ulcer, blister, skin breakdown, erythema or warmth.     Left foot:     Skin integrity: No ulcer, blister, skin  breakdown, erythema or warmth.  Neurological:     General: No focal deficit present.     Mental Status: She is alert and oriented to person, place, and time. Mental status is at baseline.     Cranial Nerves: No cranial nerve deficit.     Motor: No weakness.  Psychiatric:        Mood and Affect: Mood normal.        Behavior: Behavior normal.        Thought Content: Thought content normal.        Judgment: Judgment normal.           Assessment & Plan:  Need for vaccination - Plan: Pneumococcal conjugate vaccine 20-valent (Prevnar 20)  Hyperlipidemia, mixed  Hypothyroidism, unspecified type  Major depressive disorder, single episode, moderate with anxious distress (HCC) I believe the patient likely sprained her midfoot.  Recommended that she wear a postop shoe for the next 2 weeks to immobilize the foot to allow the ligaments to heal.  She can use Aleve  as needed for pain and swelling.  Recheck in 2 weeks and if not better see with x-ray.  However there is no segment tenderness palpation today.  I believe that the patient likely strained a ligament in her foot.  I would recommend checking the patient in December for complete physical.  At that time we can check her cholesterol.  Given her coronary artery calcium  score I would like to keep her LDL cholesterol less than 70.  Her blood pressure is well-controlled today.  I would like to check her TSH in December.  The patient received Prevnar 20 today.  Patient is due for mammogram in November when she schedules.  Her Pap smear is performed by her gynecologist.  Her bone density and her colonoscopy are up-to-date

## 2024-08-11 ENCOUNTER — Ambulatory Visit (INDEPENDENT_AMBULATORY_CARE_PROVIDER_SITE_OTHER)

## 2024-08-11 ENCOUNTER — Ambulatory Visit: Admitting: Podiatry

## 2024-08-11 DIAGNOSIS — M19072 Primary osteoarthritis, left ankle and foot: Secondary | ICD-10-CM

## 2024-08-11 DIAGNOSIS — D2371 Other benign neoplasm of skin of right lower limb, including hip: Secondary | ICD-10-CM

## 2024-08-11 DIAGNOSIS — G5761 Lesion of plantar nerve, right lower limb: Secondary | ICD-10-CM

## 2024-08-11 DIAGNOSIS — G5782 Other specified mononeuropathies of left lower limb: Secondary | ICD-10-CM

## 2024-08-11 MED ORDER — TRIAMCINOLONE ACETONIDE 40 MG/ML IJ SUSP
20.0000 mg | Freq: Once | INTRAMUSCULAR | Status: AC
  Administered 2024-08-11: 20 mg

## 2024-08-11 NOTE — Progress Notes (Signed)
 She presents today after having not seen her for a couple of years with a chief concern of pain to the dorsal and dorsal lateral aspect of the forefoot and lateral aspect of the left foot.  She also has some tenderness beneath the fourth met area right.  Denies any trauma to the foot.  Objective: Vital signs are stable oriented x 3.  Pulses palpable.  There is no erythema just some mild edema no cellulitis drainage or odor exquisite tenderness on palpation of the forefoot with particular attention to the third interdigital space of the left foot.  Radiographs taken today do not demonstrate any osseous abnormalities to the forefoot left.  No acute findings are identified.  She does have some demineralization of the bones of the left foot.  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  Benign skin lesion plantar aspect right foot.  Plan: Debridement of benign skin lesion.  I injected the neuroma today with Kenalog  and local anesthetic.  Tolerated procedure well.  A total of 10 mg was injected.  Follow-up with her in 1 month discussed appropriate shoe gear.

## 2024-08-16 ENCOUNTER — Ambulatory Visit (INDEPENDENT_AMBULATORY_CARE_PROVIDER_SITE_OTHER): Admitting: Licensed Clinical Social Worker

## 2024-08-16 DIAGNOSIS — F4329 Adjustment disorder with other symptoms: Secondary | ICD-10-CM | POA: Diagnosis not present

## 2024-08-16 NOTE — Progress Notes (Signed)
 [facility] Counselor/Therapist Progress Note  Patient ID: Beth Miranda, MRN: [id number]    Date: 08/16/24  Time Spent: 406  pm - 0510 pm : 64 Minutes  Treatment Type: Individual Therapy.  Reported Symptoms: Patient reports having depression and grief for mother who passed four years ago and now being the caregiver for her father. Anger toward family which is brother in law and his wife.    Mental Status Exam: Appearance:  Neat     Behavior: Appropriate  Motor: Normal  Speech/Language:  Clear and Coherent  Affect: Depressed  Mood: depressed  Thought process: normal  Thought content:   WNL  Sensory/Perceptual disturbances:   WNL  Orientation: oriented to person, place, time/date, situation, day of week, month of year, and year  Attention: Good  Concentration: Good  Memory: WNL  Fund of knowledge:  Good  Insight:   Good  Judgment:  Good  Impulse Control: Good    Risk Assessment: Danger to Self:  No Self-injurious Behavior: No Danger to Others: No Duty to Warn:no Physical Aggression / Violence:No  Access to Firearms a concern: No  Gang Involvement:No    Subjective:    Beth Miranda participated from office, located at [facility] with Clinician present. Beth Miranda consented to treatment.   Beth Miranda presented for her session reporting that she does feel that she is handling the situation with her sister and brother in laws. She reports that she had seen the sister in law in the grocery store. She reports that she attempted to avoid her but the sister in law spoke to her. Patient reports that she spoke back but continued looking for what she was trying to find and didn't engage in conversation. She states that she felt at this time it was best. Patient spoke briefly about her desire to discuss some of her thoughts concerning her deceased mother. She states that she has a lot of guilt for not doing more to help. She states that she has attempted to be more  supportive of her father.   Clinician actively listened and encouraged and supported client via verbal feedback. Clinician processed with patient that she has the right to handle the situation with her in law family members however she feels comfortable as long as she isn't acting in a manner that she will regret later. Clinician also processed some of patients thoughts about her Mother's passing with patient. Clinician ask if in the moment was patient deliberately avoiding being supportive? Patient responded that she was not intentionally not being supportive. Clinician pointed out that then she was doing the best she knew in that time. Clinician pointed out that as we have experiences it often can change our perspective and how we approach the same situations later. Clinician reminded her that she is honoring her Mother by being supportive and caring for her father.  Beth Miranda was active in discussion. Beth Miranda was engaged and motivated for treatment. Beth Miranda was pleasant and cooperative. Beth Miranda will use CBT, mindfulness and coping skills to help manage decrease symptoms associated with their diagnosis. Treatment planning to be reviewed by 05/19/2025.     Interventions: Cognitive Behavioral Therapy  Diagnosis: Adjustment Disorder with mixed emotional features    Damien Junk MSW, LCSW/DATE 08/16/2024

## 2024-09-06 ENCOUNTER — Ambulatory Visit: Admitting: Licensed Clinical Social Worker

## 2024-09-06 DIAGNOSIS — F4329 Adjustment disorder with other symptoms: Secondary | ICD-10-CM | POA: Diagnosis not present

## 2024-09-06 NOTE — Progress Notes (Signed)
 [facility] Counselor/Therapist Progress Note  Patient ID: Beth Miranda, MRN: [id number]    Date: 09/06/24  Time Spent: 0403 pm - 0502 pm : 59 Minutes  Treatment Type: Individual Therapy.  Reported Symptoms: Patient reports having depression and grief for mother who passed four years ago and now being the caregiver for her father. Anger toward family which is brother in law and his wife.    Mental Status Exam: Appearance:  Neat     Behavior: Appropriate  Motor: Normal  Speech/Language:  Clear and Coherent  Affect: Depressed  Mood: depressed  Thought process: normal  Thought content:   WNL  Sensory/Perceptual disturbances:   WNL  Orientation: oriented to person, place, time/date, situation, day of week, month of year, and year  Attention: Good  Concentration: Good  Memory: WNL  Fund of knowledge:  Good  Insight:   Good  Judgment:  Good  Impulse Control: Good    Risk Assessment: Danger to Self:  No Self-injurious Behavior: No Danger to Others: No Duty to Warn:no Physical Aggression / Violence:No  Access to Firearms a concern: No  Gang Involvement:No    Subjective:    Beth Miranda participated from office, located at [facility] with Clinician present. Beth Miranda consented to treatment.   Beth Miranda presented for her session stating she has had a very stressful day at work. She states that she had computer issues that always puts things on a hold when things aren't working. Patient addressed her concerns over her continued struggle at times with the death of her mother. Patient reports that she is an only child and with her father still alive she is caring for him. She reports that she tries to avoid getting emotional around her father for not wanting to upset him. Patient reports that on ocassion she will get triggered and then the rest of the day she will be emotional. Patient states that her mother passed in 2022 but she find she is still struggling with  her grief.  Clinician actively listened and processed with patient her concerns. Clinician pointed out that patient being an only child didn't have anyone who could related to her loss as someone who had siblings. Clinician also processed with patient that although she doesn't want to upset her father, it may be good for both of them to talk about her mother and share memories as it is likely good for her father too. Clinician encouraged patient to feel comfortable with her emotions of grief due to the importance of allowing herself to feel and process her emotions.  Beth Miranda was fully engaged in session and was transparent and motivated for treatment. Beth Miranda will continue to engage in bi weekly CBT therapy sessions. Beth Miranda will identify and utilize coping skills for her symptoms of depression and anxiety. Treatment planning to be reviewed by 05/19/2025.    Interventions: Cognitive Behavioral Therapy, Mindfulness Meditation, and Grief Therapy  Diagnosis: Adjustment Disorder with mixed emotional features    Damien Junk MSW, LCSW/DATE 09/06/2024

## 2024-09-13 ENCOUNTER — Ambulatory Visit (INDEPENDENT_AMBULATORY_CARE_PROVIDER_SITE_OTHER): Admitting: Podiatry

## 2024-09-13 ENCOUNTER — Ambulatory Visit: Admitting: Podiatry

## 2024-09-13 ENCOUNTER — Encounter: Payer: Self-pay | Admitting: Podiatry

## 2024-09-13 DIAGNOSIS — G5782 Other specified mononeuropathies of left lower limb: Secondary | ICD-10-CM

## 2024-09-13 NOTE — Progress Notes (Signed)
 She presents today for follow-up of her neuroma third interdigital space of her left foot.  She states it was a lot better for 2 or 3 days but is still very bothersome.  She states that is even starting to make her second knuckle hurt.  Objective: Vital signs GEN alert oriented x 3 there is no erythema edema cellulitis drainage or odor she has a palpable Mulder's click to the third interspace of the left foot exquisitely tender on palpation.  She has pain on medial-lateral compression of the metatarsal heads.  Assessment: Neuroma third interspace left.  Plan: Discussed etiology pathology conservative surgical therapies discussed the lack of improvement with cortisone therapy so recommended dehydrated alcohol therapy.  Injected 4% dehydrated alcohol 1.5 cc was injected today to the third interspace of the left foot.  Will follow-up with her in 3 weeks for her second injection.  Remember to thank her for all of the duties she provided us .

## 2024-09-20 ENCOUNTER — Ambulatory Visit: Admitting: Licensed Clinical Social Worker

## 2024-09-20 DIAGNOSIS — F4329 Adjustment disorder with other symptoms: Secondary | ICD-10-CM

## 2024-09-20 DIAGNOSIS — F4325 Adjustment disorder with mixed disturbance of emotions and conduct: Secondary | ICD-10-CM | POA: Diagnosis not present

## 2024-09-21 NOTE — Progress Notes (Signed)
 [facility] Counselor/Therapist Progress Note  Patient ID: Beth Miranda, MRN: [id number]    Date: 09/20/2024  Time Spent: 0404  pm - 0500 pm : 56 Minutes  Treatment Type: Individual Therapy.  Reported Symptoms: Patient reports having depression and grief for mother who passed four years ago and now being the caregiver for her father. Anger toward family which is brother in law and his wife.    Mental Status Exam: Appearance:  Neat     Behavior: Appropriate  Motor: Normal  Speech/Language:  Clear and Coherent  Affect: Depressed  Mood: depressed  Thought process: normal  Thought content:   WNL  Sensory/Perceptual disturbances:   WNL  Orientation: oriented to person, place, time/date, situation, day of week, month of year, and year  Attention: Good  Concentration: Good  Memory: WNL  Fund of knowledge:  Good  Insight:   Good  Judgment:  Good  Impulse Control: Good    Risk Assessment: Danger to Self:  No Self-injurious Behavior: No Danger to Others: No Duty to Warn:no Physical Aggression / Violence:No  Access to Firearms a concern: No  Gang Involvement:No    Subjective:    Beth Miranda participated from office, located at [facility] with Clinician present. Beth Miranda consented to treatment.    Beth Miranda presented for her session in a positive mood. She reports that her husbands family has decided to place their mother in a care facility. She reports that the siblings all agreed that it would be best for her and her safety. She states that she has been going to see her Dad each week. Patient reports that she has noticed that since her Mother passed she has stopped hanging out with her friends. She states that she had stopped prior to her mother passing then she got busy with her father and helping him. Patient states that she would like to do more with friends and get out more but realizes that she has avoided that.  Clinician actively listened and  processed with patient her thoughts and concerns. Clinician and patient discussed that this will be an adjustment for her spouse and his siblings but will also likely be good for everyone and their mothers safety and care. Clinician processed with patient that likely when her Mother was ill she got out of the habit of spending time with friends and then in the aftermath trying to care for her father. Sometimes we get out of the routine of things and just don't think about it anymore. Clinician encouraged patient to reach out to her friends and ask about going for a coffee or lunch. We discussed the benefits of socialization and being around positive people.  Beth Miranda was fully engaged in session and was transparent and motivated for treatment. Beth Miranda will continue to engage in bi weekly CBT therapy sessions. Beth Miranda will identify and utilize coping skills for her symptoms of depression and anxiety. Treatment planning to be reviewed by 05/19/2025.     Interventions: Cognitive Behavioral Therapy, Mindfulness Meditation, and Grief Therapy   Diagnosis: Adjustment Disorder with mixed emotional features     Damien Junk MSW, LCSW/DATE 09/20/2024

## 2024-09-22 ENCOUNTER — Encounter: Payer: Self-pay | Admitting: Family Medicine

## 2024-09-22 DIAGNOSIS — Z1231 Encounter for screening mammogram for malignant neoplasm of breast: Secondary | ICD-10-CM

## 2024-09-23 ENCOUNTER — Ambulatory Visit (INDEPENDENT_AMBULATORY_CARE_PROVIDER_SITE_OTHER): Payer: 59 | Admitting: Otolaryngology

## 2024-09-23 ENCOUNTER — Encounter (INDEPENDENT_AMBULATORY_CARE_PROVIDER_SITE_OTHER): Payer: Self-pay | Admitting: Otolaryngology

## 2024-09-23 VITALS — BP 142/86 | HR 83 | Temp 98.0°F | Ht 64.0 in | Wt 175.0 lb

## 2024-09-23 DIAGNOSIS — H903 Sensorineural hearing loss, bilateral: Secondary | ICD-10-CM

## 2024-09-23 DIAGNOSIS — H9313 Tinnitus, bilateral: Secondary | ICD-10-CM

## 2024-09-23 NOTE — Progress Notes (Signed)
 Patient ID: Beth Miranda, female   DOB: May 16, 1961, 63 y.o.   MRN: [id number]  Follow-up: Bilateral tinnitus, bilateral hearing loss  HPI: The patient is a 63 year old female who returns today for follow-up evaluation.  The patient was last seen 1 year ago.  At that time, she was complaining of bilateral high-pitched tinnitus.  Her tinnitus was constant and nonpulsatile.  Her ear canals, tympanic membranes, and middle ear spaces were all normal.  She was noted to have bilateral mild symmetric high-frequency sensorineural hearing loss.  The strategies to cope with tinnitus were discussed.  The patient returns today complaining of persistent bilateral tinnitus.  She denies any otalgia, otorrhea, or vertigo.  She denies any recent change in her hearing.  Exam: General: Communicates without difficulty, well nourished, no acute distress. Head: Normocephalic, no evidence injury, no tenderness, facial buttresses intact without stepoff. Face/sinus: No tenderness to palpation and percussion. Facial movement is normal and symmetric. Eyes: PERRL, EOMI. No scleral icterus, conjunctivae clear. Neuro: CN II exam reveals vision grossly intact.  No nystagmus at any point of gaze. Ears: Auricles well formed without lesions.  Ear canals are intact without mass or lesion.  No erythema or edema is appreciated.  The TMs are intact without fluid. Nose: External evaluation reveals normal support and skin without lesions.  Dorsum is intact.  Anterior rhinoscopy reveals pink mucosa over anterior aspect of inferior turbinates and intact septum.  No purulence noted. Oral:  Oral cavity and oropharynx are intact, symmetric, without erythema or edema.  Mucosa is moist without lesions. Neck: Full range of motion without pain.  There is no significant lymphadenopathy.  No masses palpable.  Thyroid  bed within normal limits to palpation.  Parotid glands and submandibular glands equal bilaterally without mass.  Trachea is midline. Neuro:   CN 2-12 grossly intact. Gait normal.   Assessment: 1.  Subjectively stable bilateral symmetric high-frequency sensorineural hearing loss.  2.  The patient's bilateral tinnitus is likely a direct result of her hearing loss. 3.  The patient's ear canals, tympanic membranes, and middle ear spaces are normal.   Plan: 1.  The physical exam findings are reviewed with the patient.  2.  The strategies to cope with tinnitus, including the use of masker, hearing aids, tinnitus retraining therapy, and avoidance of caffeine and alcohol are discussed.  3.  The patient is a candidate for hearing amplification.  The hearing aid options are discussed.  Hearing protection is recommended. 4.  The patient will return for reevaluation in 1 year, sooner if needed.

## 2024-10-03 ENCOUNTER — Encounter (HOSPITAL_COMMUNITY): Payer: Self-pay

## 2024-10-03 ENCOUNTER — Emergency Department (HOSPITAL_COMMUNITY)
Admission: EM | Admit: 2024-10-03 | Discharge: 2024-10-04 | Disposition: A | Discharge status: Home / Self Care | Attending: Emergency Medicine | Admitting: Emergency Medicine

## 2024-10-03 ENCOUNTER — Ambulatory Visit: Admitting: Family Medicine

## 2024-10-03 ENCOUNTER — Ambulatory Visit: Payer: Self-pay | Admitting: Family Medicine

## 2024-10-03 ENCOUNTER — Encounter: Payer: Self-pay | Admitting: Family Medicine

## 2024-10-03 ENCOUNTER — Emergency Department (HOSPITAL_COMMUNITY)

## 2024-10-03 ENCOUNTER — Other Ambulatory Visit: Payer: Self-pay

## 2024-10-03 VITALS — BP 122/78 | HR 86 | Temp 98.5°F | Ht 64.0 in | Wt 165.0 lb

## 2024-10-03 DIAGNOSIS — Z79899 Other long term (current) drug therapy: Secondary | ICD-10-CM | POA: Diagnosis not present

## 2024-10-03 DIAGNOSIS — R1031 Right lower quadrant pain: Secondary | ICD-10-CM | POA: Diagnosis present

## 2024-10-03 DIAGNOSIS — I10 Essential (primary) hypertension: Secondary | ICD-10-CM | POA: Insufficient documentation

## 2024-10-03 DIAGNOSIS — K5732 Diverticulitis of large intestine without perforation or abscess without bleeding: Secondary | ICD-10-CM | POA: Insufficient documentation

## 2024-10-03 DIAGNOSIS — Z7982 Long term (current) use of aspirin: Secondary | ICD-10-CM | POA: Diagnosis not present

## 2024-10-03 LAB — URINALYSIS, W/ REFLEX TO CULTURE (INFECTION SUSPECTED)
Bacteria, UA: NONE SEEN
Bilirubin Urine: NEGATIVE
Glucose, UA: NEGATIVE mg/dL
Ketones, ur: NEGATIVE mg/dL
Leukocytes,Ua: NEGATIVE
Nitrite: NEGATIVE
Protein, ur: NEGATIVE mg/dL
Specific Gravity, Urine: 1.012 (ref 1.005–1.030)
pH: 7 (ref 5.0–8.0)

## 2024-10-03 LAB — COMPREHENSIVE METABOLIC PANEL WITH GFR
ALT: 17 U/L (ref 0–44)
AST: 21 U/L (ref 15–41)
Albumin: 3.7 g/dL (ref 3.5–5.0)
Alkaline Phosphatase: 65 U/L (ref 38–126)
Anion gap: 9 (ref 5–15)
BUN: 11 mg/dL (ref 8–23)
CO2: 27 mmol/L (ref 22–32)
Calcium: 9.1 mg/dL (ref 8.9–10.3)
Chloride: 104 mmol/L (ref 98–111)
Creatinine, Ser: 0.9 mg/dL (ref 0.44–1.00)
GFR, Estimated: >60 mL/min (ref >60)
Glucose, Bld: 100 mg/dL — ABNORMAL HIGH (ref 70–99)
Potassium: 4.2 mmol/L (ref 3.5–5.1)
Sodium: 140 mmol/L (ref 135–145)
Total Bilirubin: 0.8 mg/dL (ref 0.0–1.2)
Total Protein: 7.3 g/dL (ref 6.5–8.1)

## 2024-10-03 LAB — I-STAT CHEM 8, ED
BUN: 14 mg/dL (ref 8–23)
Calcium, Ion: 1.2 mmol/L (ref 1.15–1.40)
Chloride: 103 mmol/L (ref 98–111)
Creatinine, Ser: 0.9 mg/dL (ref 0.44–1.00)
Glucose, Bld: 87 mg/dL (ref 70–99)
HCT: 43 % (ref 36.0–46.0)
Hemoglobin: 14.6 g/dL (ref 12.0–15.0)
Potassium: 4.3 mmol/L (ref 3.5–5.1)
Sodium: 142 mmol/L (ref 135–145)
TCO2: 29 mmol/L (ref 22–32)

## 2024-10-03 LAB — CBC WITH DIFFERENTIAL/PLATELET
Abs Immature Granulocytes: 0.02 K/uL (ref 0.00–0.07)
Basophils Absolute: 0.1 K/uL (ref 0.0–0.1)
Basophils Relative: 1 %
Eosinophils Absolute: 0.1 K/uL (ref 0.0–0.5)
Eosinophils Relative: 2 %
HCT: 44.5 % (ref 36.0–46.0)
Hemoglobin: 13.9 g/dL (ref 12.0–15.0)
Immature Granulocytes: 0 %
Lymphocytes Relative: 16 %
Lymphs Abs: 1.2 K/uL (ref 0.7–4.0)
MCH: 28.5 pg (ref 26.0–34.0)
MCHC: 31.2 g/dL (ref 30.0–36.0)
MCV: 91.4 fL (ref 80.0–100.0)
Monocytes Absolute: 0.6 K/uL (ref 0.1–1.0)
Monocytes Relative: 8 %
Neutro Abs: 5.5 K/uL (ref 1.7–7.7)
Neutrophils Relative %: 73 %
Platelets: 252 K/uL (ref 150–400)
RBC: 4.87 MIL/uL (ref 3.87–5.11)
RDW: 13.5 % (ref 11.5–15.5)
WBC: 7.5 K/uL (ref 4.0–10.5)
nRBC: 0 % (ref 0.0–0.2)

## 2024-10-03 LAB — I-STAT CG4 LACTIC ACID, ED: Lactic Acid, Venous: 0.4 mmol/L — ABNORMAL LOW (ref 0.5–1.9)

## 2024-10-03 MED ORDER — IOHEXOL 350 MG/ML SOLN
75.0000 mL | Freq: Once | INTRAVENOUS | Status: AC | PRN
  Administered 2024-10-03: 75 mL via INTRAVENOUS

## 2024-10-03 NOTE — Progress Notes (Signed)
 Subjective:    Patient ID: Beth Miranda, female    DOB: Dec 28, 1960, 63 y.o.   MRN: [id number]   Patient is a very pleasant 63 year old Caucasian female who has history of hypertension as well as hyperlipidemia.  The patient had a coronary artery calcium  score in June.  She scored 85th percentile for age and gender.  She is currently on Crestor .  She also has a history of hypothyroidism for which she is currently on levothyroxine .  Her TSH was checked in April and was normal.  She also has a history of mixed connective tissue disorder.  At one point she was on Plaquenil and Neurontin .  However she has been pain-free for quite some time and no longer sees a rheumatologist.  Started yesterday evening.  She denies any fevers or chills.  She denies nausea or vomiting however she has severe pain in the right lower quadrant.  It hurts so bad that even her hurts to try to lift her right leg.  This exacerbates the pain.  Taking a deep breath exacerbates the pain.  Simply the vibration of walking when her heel strikes the floor exacerbates the pain.  I can reproduce the pain by checking for CVA tenderness.  She has guarding and rebound with obvious acute abdomen.  Her exam suggest peritonitis. Past Medical History:  Diagnosis Date   Allergy    seasonal   GERD (gastroesophageal reflux disease)    Hyperlipidemia    Labile hypertension    Lyme disease    in the 1990/s   Prediabetes    Raynaud's disease    Thyroid  disease    hypothyroid   Vitamin D  deficiency    Past Surgical History:  Procedure Laterality Date   COLONOSCOPY     FOOT SURGERY  09/28/2020   TENDON REPAIR    TONSILLECTOMY     Current Outpatient Medications on File Prior to Visit  Medication Sig Dispense Refill   aspirin 81 MG tablet Take 81 mg by mouth daily.     buPROPion  ER (WELLBUTRIN  SR) 100 MG 12 hr tablet Take 1 tablet (100 mg total) by mouth daily. 31 tablet 5   Cetirizine HCl (ZYRTEC ALLERGY PO) Take 10 mg by mouth at  bedtime.     CINNAMON PO Take by mouth.     Cyanocobalamin (VITAMIN B 12 PO) Take 1,000 mcg by mouth daily.     gabapentin  (NEURONTIN ) 600 MG tablet Take 1/2 to 1 tablet 2 to 3 times daily as needed for pain. 90 tablet 5   levothyroxine  (SYNTHROID ) 50 MCG tablet Take 2 tablets (100 mcg total) by mouth daily before breakfast. Take  1 tablet  Daily  on an empty stomach with only water for 30 minutes & no Antacid meds, Calcium  or Magnesium for 4 hours & avoid Biotin                                                                                            /  TAKE                                         BY                                                 MOUTH                           ONCE  ?  ?  ?  ?   DAILY 31 tablet 5   Magnesium 400 MG TABS Take 1 tablet by mouth 2 (two) times daily.     pantoprazole  (PROTONIX ) 40 MG tablet Take 1 tablet (40 mg total) by mouth 2 (two) times daily. 180 tablet 3   rosuvastatin  (CRESTOR ) 20 MG tablet TAKE (1) TABLET BY MOUTH ONCE DAILY FOR CHOLESTEROL. 30 tablet 11   VITAMIN D  PO Take 5,000 Int'l Units by mouth.     No current facility-administered medications on file prior to visit.   Allergies  Allergen Reactions   Penicillins Hives   Sulfa Antibiotics Hives   Social History   Socioeconomic History   Marital status: Married    Spouse name: Not on file   Number of children: Not on file   Years of education: Not on file   Highest education level: Not on file  Occupational History   Not on file  Tobacco Use   Smoking status: Former    Types: Cigarettes   Smokeless tobacco: Never  Vaping Use   Vaping status: Never Used  Substance and Sexual Activity   Alcohol use: Yes    Comment: occasionally   Drug use: No   Sexual activity: Not Currently    Birth control/protection: Post-menopausal    Comment: 1st intercourse 63 yo-Fewer than 5 partners  Other Topics Concern   Not on file  Social  History Narrative   Lives with husband in a one story home with basement.  No children.     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   Social Drivers of Corporate investment banker Strain: Not on file  Food Insecurity: Not on file  Transportation Needs: Not on file  Physical Activity: Not on file  Stress: Not on file  Social Connections: Not on file  Intimate Partner Violence: Not on file      Review of Systems  All other systems reviewed and are negative.      Objective:   Physical Exam Vitals reviewed.  Constitutional:      Appearance: Normal appearance. She is normal weight.  Cardiovascular:     Rate and Rhythm: Normal rate and regular rhythm.  Pulmonary:     Effort: Pulmonary effort is normal. No respiratory distress.     Breath sounds: Normal breath sounds. No wheezing or rhonchi.  Abdominal:     Tenderness: There is abdominal tenderness in the right lower quadrant. There is guarding. Positive signs include McBurney's sign and psoas sign.   Neurological:     General: No focal deficit present.     Mental Status: She is alert and oriented to person, place, and time. Mental status is at baseline.     Cranial Nerves: No cranial  nerve deficit.     Motor: No weakness.  Psychiatric:        Mood and Affect: Mood normal.        Behavior: Behavior normal.        Thought Content: Thought content normal.        Judgment: Judgment normal.           Assessment & Plan:   RLQ abdominal pain Patient has 24 hours of right lower quadrant abdominal pain.  Her physical exam suggests an acute abdomen with peritonitis.  The location suggest appendicitis.  Recommend she go immediately to the emergency room for imaging and confirmation and likely require surgical consultation

## 2024-10-03 NOTE — Telephone Encounter (Signed)
 FYI Only or Action Required?: FYI only for provider.  Patient was last seen in primary care on 07/19/2024 by Duanne Butler DASEN, MD.  Called Nurse Triage reporting Abdominal Pain.  Symptoms began several days ago.  Symptoms are: gradually worsening.  Triage Disposition: See HCP Within 4 Hours (Or PCP Triage)  Patient/caregiver understands and will follow disposition?: Yes               Copied from CRM #[id number]. Topic: Clinical - Red Word Triage >> Oct 03, 2024 11:28 AM Donee H wrote: [facility] that prompted transfer to Nurse Triage: Patient experiencing a lot of pain in lower abdomen area. She thinks may have a pulled muscle. She states this started yesterday and keeps getting worse. Would like to be seen by Dr. Duanne as soon as possible Reason for Disposition  [1] MILD-MODERATE pain AND [2] constant AND [3] present > 2 hours  Answer Assessment - Initial Assessment Questions This RN scheduled pt for an appointment today in office. This RN educated pt on new-worsening symptoms and when to call back/seek emergent care. Pt verbalized understanding and agrees to plan.    Lower abdomen pain in groin area Onset: Saturday, worsened yesterday Hurts when sits down/stands up; can't stand up straight- pain when heel of foot touches floor Constant pain- 9/10 pain level; pain with urination Pt thinks she may have pulled a muscle  Protocols used: Abdominal Pain - [facility]

## 2024-10-03 NOTE — ED Triage Notes (Addendum)
 Sent by doc for CT scan r/t 10/10 RLQ abd pain.  PCP notes concerned for an acute abdomen with peritonitis, concerned for appendicitis per his note.

## 2024-10-03 NOTE — ED Triage Notes (Signed)
 Patient has significant pain to RLQ, reports rebound tenderness at PCP, difficulty walking d/t pain.

## 2024-10-03 NOTE — ED Provider Triage Note (Signed)
 Emergency Medicine Provider Triage Evaluation Note  Beth Miranda , a 63 y.o. female  was evaluated in triage.  Pt complains of right lower quadrant abdominal pain that started yesterday.  Reports pain is intermittent.  Denies any associated nausea, vomiting.  Reports normal bowel movements.  Denies any fever or chills.  Review of Systems  Positive: As above Negative: As above  Physical Exam  BP 139/84   Pulse 95   Temp (!) 97.4 F (36.3 C)   Resp 20   Ht 5' 4 (1.626 m)   Wt 74.8 kg   LMP 03/02/2013   SpO2 98%   BMI 28.32 kg/m  Gen:   Awake, no distress   Resp:  Normal effort  MSK:   Moves extremities without difficulty  Other:  Right lower quadrant tenderness with mild guarding  Medical Decision Making  Medically screening exam initiated at 5:32 PM.  Appropriate orders placed.  Beth Miranda was informed that the remainder of the evaluation will be completed by another provider, this initial triage assessment does not replace that evaluation, and the importance of remaining in the ED until their evaluation is complete.  Patient declines any pain medicine at this time.   Veta Palma, PA-C 10/03/24 1732

## 2024-10-04 ENCOUNTER — Ambulatory Visit: Admitting: Podiatry

## 2024-10-04 MED ORDER — MORPHINE SULFATE (PF) 4 MG/ML IV SOLN
4.0000 mg | Freq: Once | INTRAVENOUS | Status: AC
  Administered 2024-10-04: 4 mg via INTRAVENOUS
  Filled 2024-10-04: qty 1

## 2024-10-04 MED ORDER — METRONIDAZOLE 500 MG PO TABS
500.0000 mg | ORAL_TABLET | Freq: Once | ORAL | Status: AC
  Administered 2024-10-04: 500 mg via ORAL
  Filled 2024-10-04: qty 1

## 2024-10-04 MED ORDER — METRONIDAZOLE 500 MG PO TABS: 500.0000 mg | ORAL_TABLET | Freq: Four times a day (QID) | ORAL | 0 refills | Status: AC

## 2024-10-04 MED ORDER — OXYCODONE-ACETAMINOPHEN 5-325 MG PO TABS: 1.0000 | ORAL_TABLET | Freq: Four times a day (QID) | ORAL | 0 refills | Status: DC | PRN

## 2024-10-04 MED ORDER — CIPROFLOXACIN HCL 750 MG PO TABS: 750.0000 mg | ORAL_TABLET | Freq: Two times a day (BID) | ORAL | 0 refills | Status: DC

## 2024-10-04 MED ORDER — ONDANSETRON 4 MG PO TBDP: 4.0000 mg | ORAL_TABLET | Freq: Three times a day (TID) | ORAL | 0 refills | Status: DC | PRN

## 2024-10-04 MED ORDER — CIPROFLOXACIN HCL 500 MG PO TABS
750.0000 mg | ORAL_TABLET | Freq: Once | ORAL | Status: AC
  Administered 2024-10-04: 750 mg via ORAL
  Filled 2024-10-04: qty 2

## 2024-10-04 MED ORDER — ONDANSETRON HCL 4 MG/2ML IJ SOLN
4.0000 mg | Freq: Once | INTRAMUSCULAR | Status: AC
  Administered 2024-10-04: 4 mg via INTRAVENOUS
  Filled 2024-10-04: qty 2

## 2024-10-04 NOTE — Discharge Instructions (Addendum)
 Today you were seen for diverticulitis.  Please stick to a low residue diet and refrain from drinking beer.  You should also begin taking a probiotic.  You have been prescribed ciprofloxacin and metronidazole antibiotics.  Also been prescribed Zofran  and Percocet.  Please follow-up with gastroenterology for further evaluation and workup.  Please refrain from doing vigorous activity while taking ciprofloxacin as it does put you at a higher risk of tendon injury. Thank you for letting us  treat you today. After reviewing your labs and imaging, I feel you are safe to go home. Please follow up with your PCP in the next several days and provide them with your records from this visit. Return to the Emergency Room if pain becomes severe or symptoms worsen.

## 2024-10-04 NOTE — ED Provider Notes (Signed)
**Beth [name]**  Beth Beth Miranda   CSN: [id number] Arrival date & time: 10/03/24  1645     Patient presents with: Abdominal Pain   Beth Beth Miranda is a 63 y.o. female past medical history significant for Lyme's disease, thyroid  disease, GERD, hypertension, and GAD presents today for right lower quadrant abdominal pain.  Patient denies nausea, vomiting, fever, chills, chest pain, shortness of breath, any other complaints at this time.    Abdominal Pain      Prior to Admission medications   Medication Sig Start Date End Date Taking? Authorizing Provider  ciprofloxacin (CIPRO) 750 MG tablet Take 1 tablet (750 mg total) by mouth 2 (two) times daily. 10/04/24  Yes Junetta Hearn N, PA-C  metroNIDAZOLE (FLAGYL) 500 MG tablet Take 1 tablet (500 mg total) by mouth 4 (four) times daily for 7 days. 10/04/24 10/11/24 Yes Dannell Gortney N, PA-C  ondansetron  (ZOFRAN -ODT) 4 MG disintegrating tablet Take 1 tablet (4 mg total) by mouth every 8 (eight) hours as needed for nausea or vomiting. 10/04/24  Yes Rylie Limburg N, PA-C  oxyCODONE-acetaminophen  (PERCOCET/ROXICET) 5-325 MG tablet Take 1 tablet by mouth every 6 (six) hours as needed for severe pain (pain score 7-10). 10/04/24  Yes Alois Mincer N, PA-C  aspirin 81 MG tablet Take 81 mg by mouth daily.    [provider]  buPROPion  ER (WELLBUTRIN  SR) 100 MG 12 hr tablet Take 1 tablet (100 mg total) by mouth daily. 06/10/24   Alvia Corean CROME, FNP  Cetirizine HCl (ZYRTEC ALLERGY PO) Take 10 mg by mouth at bedtime.    [provider]  CINNAMON PO Take by mouth.    [provider]  Cyanocobalamin (VITAMIN B 12 PO) Take 1,000 mcg by mouth daily.    [provider]  gabapentin  (NEURONTIN ) 600 MG tablet Take 1/2 to 1 tablet 2 to 3 times daily as needed for pain. 09/24/23   Cranford, Tonya, NP  levothyroxine  (SYNTHROID ) 50 MCG tablet Take 2 tablets (100 mcg total) by mouth daily  before breakfast. Take  1 tablet  Daily  on an empty stomach with only water for 30 minutes & no Antacid meds, Calcium  or Magnesium for 4 hours & avoid Biotin                                                                                            /                                                                   TAKE                                         BY  MOUTH                           ONCE  ?  ?  ?  ?   DAILY 06/10/24   Alvia Corean CROME, FNP  Magnesium 400 MG TABS Take 1 tablet by mouth 2 (two) times daily.    [provider]  pantoprazole  (PROTONIX ) 40 MG tablet Take 1 tablet (40 mg total) by mouth 2 (two) times daily. 05/18/24   Charlanne Groom, MD  rosuvastatin  (CRESTOR ) 20 MG tablet TAKE (1) TABLET BY MOUTH ONCE DAILY FOR CHOLESTEROL. 06/10/24   Alvia Corean CROME, FNP  VITAMIN D  PO Take 5,000 Int'l Units by mouth.    [provider]    Allergies: Penicillins and Sulfa antibiotics    Review of Systems  Gastrointestinal:  Positive for abdominal pain.    Updated Vital Signs BP 109/66   Pulse 89   Temp 98.5 F (36.9 C) (Oral)   Resp (!) 22   Ht 5' 4 (1.626 m)   Wt 74.8 kg   LMP 03/02/2013   SpO2 98%   BMI 28.32 kg/m   Physical Exam Vitals and nursing Beth Miranda reviewed.  Constitutional:      General: She is not in acute distress.    Appearance: She is well-developed.  HENT:     Head: Normocephalic and atraumatic.  Eyes:     Conjunctiva/sclera: Conjunctivae normal.  Cardiovascular:     Rate and Rhythm: Normal rate and regular rhythm.     Heart sounds: No murmur heard. Pulmonary:     Effort: Pulmonary effort is normal. No respiratory distress.     Breath sounds: Normal breath sounds.  Abdominal:     Palpations: Abdomen is soft.     Tenderness: There is no abdominal tenderness.  Musculoskeletal:        General: No swelling.     Cervical back: Neck supple.  Skin:    General: Skin is warm and dry.      Capillary Refill: Capillary refill takes less than 2 seconds.  Neurological:     Mental Status: She is alert.  Psychiatric:        Mood and Affect: Mood normal.     (all labs ordered are listed, but only abnormal results are displayed) Labs Reviewed  COMPREHENSIVE METABOLIC PANEL WITH GFR - Abnormal; Notable for the following components:      Result Value   Glucose, Bld 100 (*)    All other components within normal limits  URINALYSIS, W/ REFLEX TO CULTURE (INFECTION SUSPECTED) - Abnormal; Notable for the following components:   Hgb urine dipstick SMALL (*)    All other components within normal limits  I-STAT CG4 LACTIC ACID, ED - Abnormal; Notable for the following components:   Lactic Acid, Venous 0.4 (*)    All other components within normal limits  CBC WITH DIFFERENTIAL/PLATELET  I-STAT CHEM 8, ED  I-STAT CG4 LACTIC ACID, ED    EKG: EKG Interpretation Date/Time:  Tuesday October 04 2024 02:39:12 EDT Ventricular Rate:  97 PR Interval:  142 QRS Duration:  83 QT Interval:  345 QTC Calculation: 439 R Axis:   65  Text Interpretation: Sinus rhythm LAE, consider biatrial enlargement Confirmed by Palumbo, April ([id number]) on 10/04/2024 2:57:23 AM  Radiology: CT ABDOMEN PELVIS W CONTRAST Result Date: 10/03/2024 CLINICAL DATA:  Right lower quadrant pain for 2 days, initial encounter EXAM: CT ABDOMEN AND PELVIS WITH CONTRAST TECHNIQUE: Multidetector CT imaging of  the abdomen and pelvis was performed using the standard protocol following bolus administration of intravenous contrast. RADIATION DOSE REDUCTION: This exam was performed according to the departmental dose-optimization program which includes automated exposure control, adjustment of the mA and/or kV according to patient size and/or use of iterative reconstruction technique. CONTRAST:  75mL OMNIPAQUE IOHEXOL 350 MG/ML SOLN COMPARISON:  None Available. FINDINGS: Lower chest: No acute abnormality. Hepatobiliary: No focal liver  abnormality is seen. No gallstones, gallbladder wall thickening, or biliary dilatation. Pancreas: Unremarkable. No pancreatic ductal dilatation or surrounding inflammatory changes. Spleen: Normal in size without focal abnormality. Adrenals/Urinary Tract: Adrenal glands are within normal limits. Nonobstructing left upper pole renal stone is noted measuring approximately 3 mm. No obstructive changes are seen. Delayed images demonstrate normal excretion. The bladder is decompressed. Stomach/Bowel: Scattered diverticular change of the colon is noted. Findings consistent with diverticulitis in the ascending colon/cecum is noted. No definitive perforation or abscess is noted at this time. The appendix is within normal limits. Small bowel and stomach are unremarkable. Vascular/Lymphatic: No significant vascular findings are present. No enlarged abdominal or pelvic lymph nodes. Reproductive: Uterus and bilateral adnexa are unremarkable. Other: No abdominal wall hernia or abnormality. No abdominopelvic ascites. Musculoskeletal: Pars defects at L5 without significant anterolisthesis. IMPRESSION: Changes of diverticulitis involving the ascending colon adjacent to the cecum. No perforation or abscess is noted. The appendix is within normal limits. 3 mm nonobstructing left renal stone. 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 Electronically Signed   By: Oneil Devonshire M.D.   On: 10/03/2024 19:43     Procedures   Medications Ordered in the ED  iohexol (OMNIPAQUE) 350 MG/ML injection 75 mL (75 mLs Intravenous Contrast Given 10/03/24 1935)  morphine (PF) 4 MG/ML injection 4 mg (4 mg Intravenous Given 10/04/24 0255)  ondansetron  (ZOFRAN ) injection 4 mg (4 mg Intravenous Given 10/04/24 0254)  metroNIDAZOLE (FLAGYL) tablet 500 mg (500 mg Oral Given 10/04/24 0324)  ciprofloxacin (CIPRO) tablet 750 mg (750 mg Oral Given 10/04/24 0324)                                    Medical Decision Making Amount and/or  Complexity of Data Reviewed Labs: ordered. Radiology: ordered.  Risk Prescription drug management.   This patient presents to the ED for concern of abdominal pain, this involves an extensive number of treatment options, and is a complaint that carries with it a high risk of complications and morbidity.  The differential diagnosis includes pancreatitis, appendicitis, choledocholithiasis, acute cholecystitis, SBO, diverticulitis, Crohn's, viral GI illness   Additional history obtained:  Additional history obtained from EMR External records from outside source obtained and reviewed including PCP notes   Lab Tests:  I Ordered, and personally interpreted labs.  The pertinent results include: CBC unremarkable, CMP unremarkable, lactic less than 0.4, UA with small hemoglobin   Imaging Studies ordered:  I ordered imaging studies including CT abdomen pelvis with contrast I independently visualized and interpreted imaging which showed changes of diverticulitis involving the ascending colon adjacent to the cecum.  No perforation or abscess noted.  The appendix is within normal limits.  3 mm nonobstructing left renal cyst I agree with the radiologist interpretation   Cardiac Monitoring: / EKG:  The patient was maintained on a cardiac monitor.  I personally viewed and interpreted the cardiac monitored which showed an underlying rhythm of: Sinus rhythm   Problem List / ED Course / Critical  interventions / Medication management I ordered medication including morphine and Zofran , metronidazole and ciprofloxacin Reevaluation of the patient after these medicines showed that the patient patient's pain has improved I have reviewed the patients home medicines and have made adjustments as needed   Test / Admission - Considered:  Patient tolerating p.o. intake without issue prior to discharge Considered for admission or further workup however patient's vital signs, physical exam, labs, and  imaging are reassuring.  Patient's symptoms likely due to acute diverticulitis.  Patient given outpatient antibiotics, analgesics, and antiemetics outpatient.  Patient to follow-up with gastroenterology for further evaluation and workup.  Patient given return precautions.  I feel patient is safe for discharge at this time.     Final diagnoses:  Diverticulitis of ascending colon    ED Discharge Orders          Ordered    oxyCODONE-acetaminophen  (PERCOCET/ROXICET) 5-325 MG tablet  Every 6 hours PRN        10/04/24 0421    ondansetron  (ZOFRAN -ODT) 4 MG disintegrating tablet  Every 8 hours PRN        10/04/24 0421    metroNIDAZOLE (FLAGYL) 500 MG tablet  4 times daily        10/04/24 0421    ciprofloxacin (CIPRO) 750 MG tablet  2 times daily        10/04/24 0421               Tessla Spurling N, PA-C 10/04/24 0434    Palumbo, April, MD 10/04/24 [phone]

## 2024-10-11 ENCOUNTER — Ambulatory Visit: Admitting: Family Medicine

## 2024-10-11 ENCOUNTER — Encounter: Payer: Self-pay | Admitting: Family Medicine

## 2024-10-11 VITALS — BP 130/72 | HR 67 | Temp 98.7°F | Ht 64.0 in | Wt 164.0 lb

## 2024-10-11 DIAGNOSIS — K5792 Diverticulitis of intestine, part unspecified, without perforation or abscess without bleeding: Secondary | ICD-10-CM | POA: Diagnosis not present

## 2024-10-11 MED ORDER — CIPROFLOXACIN HCL 500 MG PO TABS: 500.0000 mg | ORAL_TABLET | Freq: Two times a day (BID) | ORAL | 0 refills | Status: AC

## 2024-10-11 MED ORDER — METRONIDAZOLE 500 MG PO TABS: 500.0000 mg | ORAL_TABLET | Freq: Three times a day (TID) | ORAL | 0 refills | Status: AC

## 2024-10-11 NOTE — Progress Notes (Signed)
 Subjective:    Patient ID: Beth Miranda, female    DOB: [birth year], 62 y.o.   MRN: [id number]  Patient recently presented with right lower quadrant abdominal pain.  I was concerned that she had appendicitis so I referred her to the emergency room.  In the emergency room she was found to have diverticulitis in the ascending colon adjacent to the cecum.  She was started on Cipro and metronidazole and discharged home.  She states that her abdominal pain is approximately 80% better although she is still tender to palpation in the right lower quadrant.  There is no guarding or rebound but there is significant tenderness.  She also has nausea and decreased appetite.  She has completed her antibiotics.  She denies any fever but she was not having fever before.  Past Medical History:  Diagnosis Date   Allergy    seasonal   GERD (gastroesophageal reflux disease)    Hyperlipidemia    Labile hypertension    Lyme disease    in the 1990/s   Prediabetes    Raynaud's disease    Thyroid  disease    hypothyroid   Vitamin D  deficiency    Past Surgical History:  Procedure Laterality Date   COLONOSCOPY     FOOT SURGERY  09/28/2020   TENDON REPAIR    TONSILLECTOMY     Current Outpatient Medications on File Prior to Visit  Medication Sig Dispense Refill   aspirin 81 MG tablet Take 81 mg by mouth daily.     buPROPion  ER (WELLBUTRIN  SR) 100 MG 12 hr tablet Take 1 tablet (100 mg total) by mouth daily. 31 tablet 5   Cetirizine HCl (ZYRTEC ALLERGY PO) Take 10 mg by mouth at bedtime.     CINNAMON PO Take by mouth.     ciprofloxacin (CIPRO) 750 MG tablet Take 1 tablet (750 mg total) by mouth 2 (two) times daily. 14 tablet 0   Cyanocobalamin (VITAMIN B 12 PO) Take 1,000 mcg by mouth daily.     gabapentin  (NEURONTIN ) 600 MG tablet Take 1/2 to 1 tablet 2 to 3 times daily as needed for pain. 90 tablet 5   levothyroxine  (SYNTHROID ) 50 MCG tablet Take 2 tablets (100 mcg total) by mouth daily before  breakfast. Take  1 tablet  Daily  on an empty stomach with only water for 30 minutes & no Antacid meds, Calcium  or Magnesium for 4 hours & avoid Biotin                                                                                            /                                                                   TAKE  BY                                                 MOUTH                           ONCE  ?  ?  ?  ?   DAILY 31 tablet 5   Magnesium 400 MG TABS Take 1 tablet by mouth 2 (two) times daily.     metroNIDAZOLE (FLAGYL) 500 MG tablet Take 1 tablet (500 mg total) by mouth 4 (four) times daily for 7 days. 28 tablet 0   ondansetron  (ZOFRAN -ODT) 4 MG disintegrating tablet Take 1 tablet (4 mg total) by mouth every 8 (eight) hours as needed for nausea or vomiting. 20 tablet 0   oxyCODONE-acetaminophen  (PERCOCET/ROXICET) 5-325 MG tablet Take 1 tablet by mouth every 6 (six) hours as needed for severe pain (pain score 7-10). 15 tablet 0   pantoprazole  (PROTONIX ) 40 MG tablet Take 1 tablet (40 mg total) by mouth 2 (two) times daily. 180 tablet 3   rosuvastatin  (CRESTOR ) 20 MG tablet TAKE (1) TABLET BY MOUTH ONCE DAILY FOR CHOLESTEROL. 30 tablet 11   VITAMIN D  PO Take 5,000 Int'l Units by mouth.     No current facility-administered medications on file prior to visit.   Allergies  Allergen Reactions   Penicillins Hives   Sulfa Antibiotics Hives   Social History   Socioeconomic History   Marital status: Married    Spouse name: Not on file   Number of children: Not on file   Years of education: Not on file   Highest education level: Not on file  Occupational History   Not on file  Tobacco Use   Smoking status: Former    Types: Cigarettes   Smokeless tobacco: Never  Vaping Use   Vaping status: Never Used  Substance and Sexual Activity   Alcohol use: Yes    Comment: occasionally   Drug use: No   Sexual activity: Not Currently    Birth  control/protection: Post-menopausal    Comment: 1st intercourse 63 yo-Fewer than 5 partners  Other Topics Concern   Not on file  Social History Narrative   Lives with husband in a one story home with basement.  No children.     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   Social Drivers of Corporate Investment Banker Strain: Not on file  Food Insecurity: Not on file  Transportation Needs: Not on file  Physical Activity: Not on file  Stress: Not on file  Social Connections: Not on file  Intimate Partner Violence: Not on file      Review of Systems  All other systems reviewed and are negative.      Objective:   Physical Exam Vitals reviewed.  Constitutional:      Appearance: Normal appearance. She is normal weight.  Cardiovascular:     Rate and Rhythm: Normal rate and regular rhythm.  Pulmonary:     Effort: Pulmonary effort is normal. No respiratory distress.     Breath sounds: Normal breath sounds. No wheezing or rhonchi.  Abdominal:     General: Bowel sounds are normal. There is no distension.     Tenderness: There is abdominal tenderness in the right lower  quadrant. There is no guarding or rebound. Positive signs include McBurney's sign and psoas sign.   Neurological:     General: No focal deficit present.     Mental Status: She is alert and oriented to person, place, and time. Mental status is at baseline.     Cranial Nerves: No cranial nerve deficit.     Motor: No weakness.  Psychiatric:        Mood and Affect: Mood normal.        Behavior: Behavior normal.        Thought Content: Thought content normal.        Judgment: Judgment normal.           Assessment & Plan:   Diverticulitis I am going to extend Cipro 500 mg twice daily for 7 days and Flagyl 500 mg twice daily for 7 days.  I would like the patient's pain to be much better before she discontinues antibiotics.  I also recommended that she had MiraLAX as a  stool softener to prevent constipation.  I will make a referral to the patient's GI specialist to discuss a follow-up colonoscopy.

## 2024-10-17 ENCOUNTER — Other Ambulatory Visit: Payer: Self-pay | Admitting: Family Medicine

## 2024-10-17 DIAGNOSIS — Z1231 Encounter for screening mammogram for malignant neoplasm of breast: Secondary | ICD-10-CM

## 2024-10-25 ENCOUNTER — Ambulatory Visit: Admitting: Licensed Clinical Social Worker

## 2024-10-25 DIAGNOSIS — F4329 Adjustment disorder with other symptoms: Secondary | ICD-10-CM | POA: Diagnosis not present

## 2024-10-25 NOTE — Progress Notes (Signed)
 [facility] Counselor/Therapist Progress Note  Patient ID: Beth Miranda, MRN: [id number]    Date: 10/25/24  Time Spent: 0407  pm - 0502 pm : 55 Minutes  Treatment Type: Individual Therapy.  Reported Symptoms: Patient reports having depression and grief for mother who passed four years ago and now being the caregiver for her father. Anger toward family which is brother in law and his wife.    Mental Status Exam: Appearance:  Neat     Behavior: Appropriate  Motor: Normal  Speech/Language:  Clear and Coherent  Affect: Depressed  Mood: depressed  Thought process: normal  Thought content:   WNL  Sensory/Perceptual disturbances:   WNL  Orientation: oriented to person, place, time/date, situation, day of week, month of year, and year  Attention: Good  Concentration: Good  Memory: WNL  Fund of knowledge:  Good  Insight:   Good  Judgment:  Good  Impulse Control: Good    Risk Assessment: Danger to Self:  No Self-injurious Behavior: No Danger to Others: No Duty to Warn:no Physical Aggression / Violence:No  Access to Firearms a concern: No  Gang Involvement:No    Subjective:    Beth Miranda participated from office, located at [facility] with Clinician present. Beth Miranda consented to treatment.   Beth Miranda presented for her session in a positive mood. She reports that this is a busy time of year for her at work. She reports that she doesn't mind because she has done her job for so long that she is accustomed to it. Patient states that her mother in law is settled in the care facility and all seems well for now. She states that her sister in law wants to bring her home for Thanksgiving but the family is concerned that it will cause confusion for her. Beth Miranda reports that she has made it known that due to the brother in law and his wife she will not attend Thanksgiving with her husbands family. She states that she will be with her father which provides a good excuse.  She states that her husband is supportive and understands. Patient states that she is continuing to work on not being verbal and not allowing these hurtful things from family and church to not have a negative effect on her life.   Clinician actively listened and processed with patient her concerns and provided support and encouragement. Clinician processed with patient not allowing things to bother you, often framed as developing emotional resilience or healthy detachment, yields significant mental, physical, and interpersonal benefits. It allows you to maintain inner peace, make better decisions, and focus your energy on what truly matters.  Mental and Emotional Benefits Inner Peace and Calm: The primary benefit is a lasting sense of inner peace and tranquility, free from the constant mental and emotional turmoil caused by worry and overthinking. Reduced Stress and Anxiety: By not internalizing external negativity, you lower your overall stress levels, which helps prevent burnout and the physical toll of chronic worry. Improved Decision-Making: When not overwhelmed by emotional reactions, you can think more clearly, rationally, and objectively, leading to better judgment and more effective problem-solving skills. Enhanced Creativity and Focus: Freeing your mind from constant worry creates more mental space, which improves concentration, allows for flexible thinking, and enhances creativity. Stronger Sense of Self: You become less dependent on external validation and others' opinions, which builds self-esteem, self-worth, and confidence in your own identity. Greater Adaptability and Resilience: You are better equipped to bounce back from setbacks, criticism, and  adversity, viewing challenges as opportunities for growth rather than personal defeats. Less Irritability: Reduced tension and stress make you more patient and agreeable, improving your mood stability.  Physical Health Benefits Better Physical  Well-being: Lower chronic stress levels reduce the risk of stress-related physical ailments, such as muscle tension, high blood pressure, and a weakened immune system. Improved Sleep Quality: Worry is a notorious interrupter of sleep; worrying less leads to more peaceful and restorative rest. Increased Energy Levels: The fight-or-flight response is activated less frequently, conserving energy that can be put to more productive use.  Interpersonal Benefits Healthier Relationships: By setting boundaries and not taking things personally, you reduce conflict, improve communication, and foster more authentic, supportive connections. Empathetic Interactions: Detachment isn't about being cold; it allows you to observe situations and listen to others without getting lost in their negativity, enabling you to respond with compassion and empathy rather than reactivity. Attracting Positivity: By focusing on your own growth and surrounding yourself with supportive people, you attract more positive interactions into your life.   Bay was fully engaged in session and was transparent and motivated for treatment. Beth Miranda will continue to engage in bi weekly CBT therapy sessions. Beth Miranda will identify and utilize coping skills for her symptoms of depression and anxiety. Treatment planning to be reviewed by 05/19/2025.     Interventions: Cognitive Behavioral Therapy, Mindfulness Meditation, and Grief Therapy   Diagnosis: Adjustment Disorder with mixed emotional features    Damien Junk MSW, LCSW/DATE 10/25/2024

## 2024-11-02 ENCOUNTER — Telehealth: Payer: Self-pay | Admitting: Family Medicine

## 2024-11-02 NOTE — Telephone Encounter (Signed)
 Copied from CRM #[id number]. Topic: Clinical - Medication Refill >> Nov 02, 2024  4:23 PM Kevelyn M wrote: Medication: gabapentin  (NEURONTIN ) 600 MG tablet  Has the patient contacted their pharmacy? Yes (Agent: If no, request that the patient contact the pharmacy for the refill. If patient does not wish to contact the pharmacy document the reason why and proceed with request.) (Agent: If yes, when and what did the pharmacy advise?)  This is the patient's preferred pharmacy:  [facility] [place], [place] - [id number] [place] [address] Phone: [phone] Fax: [phone]  Is this the correct pharmacy for this prescription? Yes If no, delete pharmacy and type the correct one.   Has the prescription been filled recently? No  Is the patient out of the medication? Yes  Has the patient been seen for an appointment in the last year OR does the patient have an upcoming appointment? Yes  Can we respond through MyChart? Yes  Agent: Please be advised that Rx refills may take up to 3 business days. We ask that you follow-up with your pharmacy.

## 2024-11-04 ENCOUNTER — Ambulatory Visit
Admission: RE | Admit: 2024-11-04 | Discharge: 2024-11-04 | Disposition: A | Discharge status: Home / Self Care | Source: Ambulatory Visit | Attending: Family Medicine | Admitting: Family Medicine

## 2024-11-04 DIAGNOSIS — Z1231 Encounter for screening mammogram for malignant neoplasm of breast: Secondary | ICD-10-CM

## 2024-11-04 NOTE — Telephone Encounter (Signed)
 Requested medication (s) are due for refill today: yes  Requested medication (s) are on the active medication list: yes  Last refill:  09/24/23  Future visit scheduled: yes  Notes to clinic:  Unable to refill per protocol, last refill by another provider.      Requested Prescriptions  Pending Prescriptions Disp Refills   gabapentin  (NEURONTIN ) 600 MG tablet 90 tablet 5    Sig: Take 1/2 to 1 tablet 2 to 3 times daily as needed for pain.     Neurology: Anticonvulsants - gabapentin  Passed - 11/04/2024  4:11 PM      Passed - Cr in normal range and within 360 days    Creat  Date Value Ref Range Status  11/20/2023 0.95 0.50 - 1.05 mg/dL Final   Creatinine, Ser  Date Value Ref Range Status  10/03/2024 0.90 0.44 - 1.00 mg/dL Final   Creatinine, Urine  Date Value Ref Range Status  11/20/2023 35 20 - 275 mg/dL Final         Passed - Completed PHQ-2 or PHQ-9 in the last 360 days      Passed - Valid encounter within last 12 months    Recent Outpatient Visits           3 weeks ago Diverticulitis   [place] [facility] Duanne Butler DASEN, MD   1 month ago RLQ abdominal pain   [facility] [facility] Duanne, Butler DASEN, MD   3 months ago Need for vaccination   [place] [facility] Duanne Butler DASEN, MD

## 2024-11-08 ENCOUNTER — Telehealth: Payer: Self-pay

## 2024-11-08 NOTE — Telephone Encounter (Signed)
 Copied from CRM #[id number]. Topic: Clinical - Prescription Issue >> Nov 08, 2024  2:12 PM Montie POUR wrote: Reason for CRM:  Christionna is calling back about refill for gabapentin  (NEURONTIN ) 600 MG tablet. Pharmacy has not received refill.   Chart shows: 11/03/24 1345 Re[address] Raynaldo Clotilda SAILOR, RN To [phone]  Sig: Take 1/2 to 1 tablet 2 to 3 times daily as needed for pain.  Sent to pharmacy as: gabapentin  (NEURONTIN ) 600 MG tablet  Notes to Pharmacy: This prescription was filled on 09/04/2023. Any refills authorized will be placed on file.  E-Prescribing Status: Receipt confirmed by pharmacy (09/24/2023 11:12 AM EDT)   Please call her at [phone] Thanks

## 2024-11-09 ENCOUNTER — Other Ambulatory Visit: Payer: Self-pay | Admitting: Family Medicine

## 2024-11-09 MED ORDER — GABAPENTIN 600 MG PO TABS: ORAL_TABLET | ORAL | 5 refills | Status: DC

## 2024-11-09 MED ORDER — GABAPENTIN 600 MG PO TABS: ORAL_TABLET | ORAL | 5 refills | Status: AC

## 2024-11-17 ENCOUNTER — Ambulatory Visit: Payer: Self-pay | Admitting: Gastroenterology

## 2024-11-17 ENCOUNTER — Encounter: Payer: Self-pay | Admitting: Gastroenterology

## 2024-11-17 VITALS — BP 134/84 | HR 74 | Wt 166.2 lb

## 2024-11-17 DIAGNOSIS — K5732 Diverticulitis of large intestine without perforation or abscess without bleeding: Secondary | ICD-10-CM

## 2024-11-17 DIAGNOSIS — K5909 Other constipation: Secondary | ICD-10-CM

## 2024-11-17 DIAGNOSIS — K5730 Diverticulosis of large intestine without perforation or abscess without bleeding: Secondary | ICD-10-CM

## 2024-11-17 MED ORDER — DOXYCYCLINE HYCLATE 100 MG PO TABS: 100.0000 mg | ORAL_TABLET | Freq: Two times a day (BID) | ORAL | 0 refills | Status: DC

## 2024-11-17 NOTE — Progress Notes (Signed)
 Chief Complaint:follow-up ED visit, diverticulitis Primary GI Doctor: Dr. Charlanne  HPI:  Beth Miranda is a  63  year old female Beth Miranda with past medical history of Lyme's disease, thyroid  disease, GERD, hypertension, and GAD, who was referred to me by Duanne Butler DASEN, MD on 10/11/24 for a evaluation of follow-up on recent ED visit for diverticulitis .    10/03/24 ED visit for RLQ pain.CBC unremarkable, CMP unremarkable, lactic less than 0.4, UA with small hemoglobin. CTAP with changes of diverticulitis involving the ascending colon adjacent to the cecum. No perforation or abscess is noted. She was started on Cipro  and metronidazole  and discharged home.   Interval History Beth Miranda last seen in GI office on 05/18/24 by Dr. Charlanne.   Beth Miranda reports she completed 7 days of Cipro  and flagyl  prescribed from ED. She went to see her PCP a week later with some lingering discomfort so he gave her additional 7 days but with lower dosage due to some GI upset.  She has continued to experience intermittent RLQ discomfort, rates 2/10 on pain scale today. She is back to regular diet, but reports she is fearful to eat anything to cause it to come back. She denies nausea. No fever or chills.   She has history of chronic constipation and currently taking OTC Miralax po daily and probiotics OTC since diverticulitis diagnosis. She has BM every day.    Wt Readings from Last 3 Encounters:  11/17/24 166 lb 4 oz (75.4 kg)  10/11/24 164 lb (74.4 kg)  10/03/24 165 lb (74.8 kg)    Past Medical History:  Diagnosis Date   Allergy    seasonal   GERD (gastroesophageal reflux disease)    Hyperlipidemia    Labile hypertension    Lyme disease    in the 1990/s   Prediabetes    Raynaud's disease    Thyroid  disease    hypothyroid   Vitamin D  deficiency     Past Surgical History:  Procedure Laterality Date   COLONOSCOPY     FOOT SURGERY  09/28/2020   TENDON REPAIR    TONSILLECTOMY      Current Outpatient  Medications  Medication Sig Dispense Refill   doxycycline (VIBRA-TABS) 100 MG tablet Take 1 tablet (100 mg total) by mouth 2 (two) times daily. 20 tablet 0   aspirin 81 MG tablet Take 81 mg by mouth daily.     buPROPion  ER (WELLBUTRIN  SR) 100 MG 12 hr tablet Take 1 tablet (100 mg total) by mouth daily. 31 tablet 5   Cetirizine HCl (ZYRTEC ALLERGY PO) Take 10 mg by mouth at bedtime.     CINNAMON PO Take by mouth.     Cyanocobalamin (VITAMIN B 12 PO) Take 1,000 mcg by mouth daily.     gabapentin  (NEURONTIN ) 600 MG tablet Take 1/2 to 1 tablet 2 to 3 times daily as needed for pain. 90 tablet 5   levothyroxine  (SYNTHROID ) 50 MCG tablet Take 2 tablets (100 mcg total) by mouth daily before breakfast. Take  1 tablet  Daily  on an empty stomach with only water for 30 minutes & no Antacid meds, Calcium  or Magnesium for 4 hours & avoid Biotin                                                                                            /  TAKE                                         BY                                                 MOUTH                           ONCE  ?  ?  ?  ?   DAILY 31 tablet 5   Magnesium 400 MG TABS Take 1 tablet by mouth 2 (two) times daily.     ondansetron  (ZOFRAN -ODT) 4 MG disintegrating tablet Take 1 tablet (4 mg total) by mouth every 8 (eight) hours as needed for nausea or vomiting. 20 tablet 0   oxyCODONE -acetaminophen  (PERCOCET/ROXICET) 5-325 MG tablet Take 1 tablet by mouth every 6 (six) hours as needed for severe pain (pain score 7-10). (Beth Miranda not taking: Reported on 10/11/2024) 15 tablet 0   pantoprazole  (PROTONIX ) 40 MG tablet Take 1 tablet (40 mg total) by mouth 2 (two) times daily. 180 tablet 3   rosuvastatin  (CRESTOR ) 20 MG tablet TAKE (1) TABLET BY MOUTH ONCE DAILY FOR CHOLESTEROL. 30 tablet 11   VITAMIN D  PO Take 5,000 Int'l Units by mouth.     No current facility-administered medications for this visit.     Allergies as of 11/17/2024 - Review Complete 11/17/2024  Allergen Reaction Noted   Penicillins Hives 05/14/2012   Sulfa antibiotics Hives 05/14/2012    Family History  Problem Relation Age of Onset   Arthritis Mother        Living   COPD Mother    Asthma Mother    Hypertension Father        Living   Diabetes Father    Kidney disease Father        Stones   Hyperlipidemia Father    Pancreatic cancer Paternal Grandmother    Other Brother        Deceased   Breast cancer Neg Hx    Colon cancer Neg Hx    Esophageal cancer Neg Hx    Rectal cancer Neg Hx    Stomach cancer Neg Hx    BRCA 1/2 Neg Hx     Review of Systems:    Constitutional: No weight loss, fever, chills, weakness or fatigue HEENT: Eyes: No change in vision               Ears, Nose, Throat:  No change in hearing or congestion Skin: No rash or itching Cardiovascular: No chest pain, chest pressure or palpitations   Respiratory: No SOB or cough Gastrointestinal: See HPI and otherwise negative Genitourinary: No dysuria or change in urinary frequency Neurological: No headache, dizziness or syncope Musculoskeletal: No new muscle or joint pain Hematologic: No bleeding or bruising Psychiatric: No history of depression or anxiety    Physical Exam:  Vital signs: BP 134/84 (BP Location: Left Arm, Beth Miranda Position: Sitting, Cuff Size: Normal)   Pulse 74   Wt 166 lb 4 oz (75.4 kg)   LMP 03/02/2013   BMI 28.54 kg/m   Constitutional:   Pleasant female appears to be in NAD, Well developed, Well nourished, alert and cooperative Eyes:  PEERL, EOMI. No icterus. Conjunctiva pink. Neck:  Supple Throat: Oral cavity and pharynx without inflammation, swelling or lesion.  Respiratory: Respirations even and unlabored. Lungs clear to auscultation bilaterally.   No wheezes, crackles, or rhonchi.  Cardiovascular: Normal S1, S2. Regular rate and rhythm. No peripheral edema, cyanosis or pallor.  Gastrointestinal:  Soft,  nondistended, RLQ tenderness with palpation. No rebound or guarding. Hypoactive bowel sounds. No appreciable masses or hepatomegaly. Rectal:  Not performed.  Msk:  Symmetrical without gross deformities. Without edema, no deformity or joint abnormality.  Neurologic:  Alert and  oriented x4;  grossly normal neurologically.  Skin:   Dry and intact without significant lesions or rashes.  RELEVANT LABS AND IMAGING: CBC    Latest Ref Rng & Units 10/03/2024    7:08 PM 10/03/2024    5:50 PM 04/08/2024    9:24 AM  CBC  WBC 4.0 - 10.5 K/uL  7.5  4.5   Hemoglobin 12.0 - 15.0 g/dL 85.3  86.0  86.1   Hematocrit 36.0 - 46.0 % 43.0  44.5  42.7   Platelets 150 - 400 K/uL  252  229.0      CMP     Latest Ref Rng & Units 10/03/2024    7:08 PM 10/03/2024    5:50 PM 04/08/2024    9:24 AM  CMP  Glucose 70 - 99 mg/dL 87  899  81   BUN 8 - 23 mg/dL 14  11  15    Creatinine 0.44 - 1.00 mg/dL 9.09  9.09  8.95   Sodium 135 - 145 mmol/L 142  140  142   Potassium 3.5 - 5.1 mmol/L 4.3  4.2  4.8   Chloride 98 - 111 mmol/L 103  104  107   CO2 22 - 32 mmol/L  27  31   Calcium  8.9 - 10.3 mg/dL  9.1  9.4   Total Protein 6.5 - 8.1 g/dL  7.3  7.3   Total Bilirubin 0.0 - 1.2 mg/dL  0.8  0.3   Alkaline Phos 38 - 126 U/L  65  47   AST 15 - 41 U/L  21  21   ALT 0 - 44 U/L  17  17      Lab Results  Component Value Date   TSH 2.01 04/08/2024  10/03/24 CTAP IMPRESSION: Changes of diverticulitis involving the ascending colon adjacent to the cecum. No perforation or abscess is noted. The appendix is within normal limits.    3 mm nonobstructing left renal stone.   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  06/06/22 colonoscopy - Mild predominantly sigmoid diverticulosis - Non- bleeding internal hemorrhoids. - The examined portion of the ileum was normal. - The examination was otherwise normal on direct and retroflexion views. Path:  Diagnosis 1. Surgical [P], small bowel bx - BENIGN SMALL BOWEL  MUCOSA WITH NO SIGNIFICANT PATHOLOGIC CHANGES 2. Surgical [P], gastric antrum and gastric body - GASTRIC ANTRAL AND OXYNTIC MUCOSA WITH NO SPECIFIC PATHOLOGIC CHANGES - NEGATIVE FOR H. PYLORI ON H&E STAIN - NEGATIVE FOR INTESTINAL METAPLASIA OR MALIGNANCY 3. Surgical [P], distal esophagus bx - BENIGN SQUAMOUS MUCOSA WITH MILD REFLUX CHANGES - NEGATIVE FOR INTESTINAL METAPLASIA, DYSPLASIA OR MALIGNANCY 4. Surgical [P], proximal and mid esophagus bx - BENIGN SQUAMOUS MUCOSA WITH MILD REFLUX CHANGES - NEGATIVE FOR INCREASED INTRAEPITHELIAL EOSINOPHILS - NEGATIVE FOR DYSPLASIA OR MALIGNANC  Assessment: Encounter Diagnoses  Name Primary?   Diverticulitis of colon Yes   Diverticulosis of colon without hemorrhage  Chronic constipation     63 year old female Beth Miranda that presents for follow-up after recent bout of diverticulitis in October, despite receiving over 2 weeks worth of ciprofloxacin  and Flagyl  Beth Miranda continues with right lower quadrant pain very tender to touch.  Given her history of penicillin allergy unable to give her Augmentin Anjuli Gemmill consider allergy testing in the future.  Discussed with Dr. Charlanne and will go ahead and prescribe doxycycline 100 mg twice daily for 10 days.  We spent several minutes discussing dietary modifications as well as itching or constipation.  Beth Miranda will continue daily MiraLAX. ER precautions given. Last colon 6/23 and normal with exception of diverticular disease and hemorrhoids, recall 10 years. Spent several minutes with Beth Miranda listening to her concerns as she expresses this bout has caused her some anxiety.  Plan: -Continue Miralax po daily -Continue probiotic po daily  -Will prescribe Doxy 100 BID x 10 days due to PCN allergy -if no improvement will order another CTAP -Recall colonoscopy 05/2032  Thank you for the courtesy of this consult. Please call me with any questions or concerns.   Christphor Groft, FNP-C [facility] 11/17/2024, 4:41 PM  Cc:  Duanne Butler DASEN, MD

## 2024-11-17 NOTE — Patient Instructions (Addendum)
      _______________________________________________________  If your blood pressure at your visit was 140/90 or greater, please contact your primary care physician to follow up on this.  _______________________________________________________  If you are age 62 or older, your body mass index should be between 23-30. Your Body mass index is 28.54 kg/m. If this is out of the aforementioned range listed, please consider follow up with your Primary Care Provider.  If you are age 61 or younger, your body mass index should be between 19-25. Your Body mass index is 28.54 kg/m. If this is out of the aformentioned range listed, please consider follow up with your Primary Care Provider.   ________________________________________________________  The [facility] providers would like to encourage you to use MYCHART to communicate with providers for non-urgent requests or questions.  Due to long hold times on the telephone, sending your provider a message by [facility] may be a faster and more efficient way to get a response.  Please allow 48 business hours for a response.  Please remember that this is for non-urgent requests.  _______________________________________________________  [facility] is using a team-based approach to care.  Your team is made up of your doctor and two to three APPS. Our APPS (Nurse Practitioners and Physician Assistants) work with your physician to ensure care continuity for you. They are fully qualified to address your health concerns and develop a treatment plan. They communicate directly with your gastroenterologist to care for you. Seeing the Advanced Practice Practitioners on your physician's team can help you by facilitating care more promptly, often allowing for earlier appointments, access to diagnostic testing, procedures, and other specialty referrals.   Thank you for trusting me with your gastrointestinal care. Deanna May, FNP-C

## 2024-11-21 ENCOUNTER — Encounter: Admitting: Family Medicine

## 2024-11-21 ENCOUNTER — Encounter: Payer: 59 | Admitting: Nurse Practitioner

## 2024-11-23 ENCOUNTER — Ambulatory Visit: Admitting: Licensed Clinical Social Worker

## 2024-11-24 ENCOUNTER — Ambulatory Visit: Admitting: Podiatry

## 2024-11-29 ENCOUNTER — Ambulatory Visit: Admitting: Podiatry

## 2024-11-29 ENCOUNTER — Encounter: Payer: Self-pay | Admitting: Podiatry

## 2024-11-29 DIAGNOSIS — G5782 Other specified mononeuropathies of left lower limb: Secondary | ICD-10-CM

## 2024-11-29 NOTE — Progress Notes (Signed)
 Beth Miranda presents today for follow-up of her neuroma third interdigital space left foot.  She states that it is really affecting her ability for her daily activities and is affecting her enjoyment of life.  States it has become so painful that she can barely walk lightheadedness causing pain to the lateral aspect of her foot.  Objective: Vital signs are stable she is alert and oriented x 3.  Pulses are palpable.  She has severe pain on palpation to the third interdigital space of the left foot and tenderness on palpation of the fifth metatarsal.  There is no edema erythema cellulitis drainage or odor.  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  Plan: All conservative therapies have failed to render this patient asymptomatic including steroids nonsteroidals and alcohol therapy and NSAIDs.  At this point we consented her for a neurectomy third interdigital space left foot we discussed the possible postop complication side effects associated with this she understands this and is amenable to it and I will follow-up with her in the near future for excision of neuroma.

## 2024-12-20 ENCOUNTER — Encounter (HOSPITAL_COMMUNITY): Payer: Self-pay | Admitting: *Deleted

## 2024-12-20 ENCOUNTER — Telehealth: Payer: Self-pay | Admitting: Podiatry

## 2024-12-20 ENCOUNTER — Ambulatory Visit (INDEPENDENT_AMBULATORY_CARE_PROVIDER_SITE_OTHER)

## 2024-12-20 ENCOUNTER — Ambulatory Visit (HOSPITAL_COMMUNITY)
Admission: EM | Admit: 2024-12-20 | Discharge: 2024-12-20 | Disposition: A | Discharge status: Home / Self Care | Attending: Emergency Medicine | Admitting: Emergency Medicine

## 2024-12-20 ENCOUNTER — Ambulatory Visit (HOSPITAL_COMMUNITY): Payer: Self-pay

## 2024-12-20 DIAGNOSIS — S8992XA Unspecified injury of left lower leg, initial encounter: Secondary | ICD-10-CM

## 2024-12-20 DIAGNOSIS — M25462 Effusion, left knee: Secondary | ICD-10-CM | POA: Diagnosis not present

## 2024-12-20 NOTE — Discharge Instructions (Signed)
 Xrays did not show acute bony abnormality  I suspect you have a hematoma underneath your laceration  Ice will help with swelling as well as compression  Tylenol  500 mg every 8 hours as needed for pain  Hematoma / swelling should improve over the next week or two, if issues persist follow-up with orthopedic and they can ultrasound / drain the area if needed.

## 2024-12-20 NOTE — ED Triage Notes (Signed)
 Pt states she was cutting wood and a big piece of wood rolled into her left knee on 12/17/2024. She has a cut and some bruising on that area. She has been taking tylenol  for the pain.

## 2024-12-20 NOTE — Telephone Encounter (Signed)
 Called and patient is scheduled for surgery on 02/03/2025. Patient is wanting to know her expected downtime post op so she can coordinate with her employer. Please advise.    Patient taking aspirin 81 mg and has been advised to d/c use 7 days prior to surgery. Patient not on any GLP1 medications. Patient pharmacy correct in chart.

## 2024-12-20 NOTE — ED Provider Notes (Signed)
 " [facility]    CSN: [id number] Arrival date & time: 12/20/24  0849      History   Chief Complaint Chief Complaint  Patient presents with   Knee Injury    HPI Beth Miranda is a 64 y.o. female.   Patient presents to clinic over concern of left knee pain, bruising and swelling after an injury on 12/17/24.   She was at the church charge helping cutting down the base of a large tree when part of the tree rolled off and onto her left lower leg.  Initially hit her left knee first and then her entire left lower leg.  She has been heating and icing as well as taking Tylenol .  Decided to come in today because the pain in the knee is not getting any better.  She has bruising from the knee down to the ankle.  Has been ambulatory.  Difficulty with full range of motion of the left knee due to swelling, mostly located medially above a healing laceration.  The history is provided by the patient and medical records.    Past Medical History:  Diagnosis Date   Allergy    seasonal   GERD (gastroesophageal reflux disease)    Hyperlipidemia    Labile hypertension    Lyme disease    in the 1990/s   Prediabetes    Raynaud's disease    Thyroid  disease    hypothyroid   Vitamin D  deficiency     Patient Active Problem List   Diagnosis Date Noted   Adjustment disorder with mixed emotional features 05/19/2024   GAD (generalized anxiety disorder) 05/08/2024   Medication management 05/08/2024   Sensorineural hearing loss, bilateral 09/18/2023   Tinnitus, bilateral 09/18/2023   Cerebral microvascular disease 09/05/2021   Major depressive disorder, single episode, moderate with anxious distress (HCC) 02/07/2020   Menopausal syndrome 02/22/2019   Disorder of connective tissue 02/22/2019   MCTD (mixed connective tissue disease), remote  (HCC) 06/04/2016   Hypothyroidism 05/09/2014   Labile hypertension    Gastroesophageal reflux disease    Raynaud's disease    Hyperlipidemia,  mixed    Abnormal glucose    Vitamin D  deficiency     Past Surgical History:  Procedure Laterality Date   COLONOSCOPY     FOOT SURGERY  09/28/2020   TENDON REPAIR    TONSILLECTOMY      OB History     Gravida  0   Para  0   Term  0   Preterm  0   AB  0   Living  0      SAB  0   IAB  0   Ectopic  0   Multiple  0   Live Births  0            Home Medications    Prior to Admission medications  Medication Sig Start Date End Date Taking? Authorizing Provider  aspirin 81 MG tablet Take 81 mg by mouth daily.   Yes [provider]  buPROPion  ER (WELLBUTRIN  SR) 100 MG 12 hr tablet Take 1 tablet (100 mg total) by mouth daily. 06/10/24  Yes Alvia Corean CROME, FNP  Cetirizine HCl (ZYRTEC ALLERGY PO) Take 10 mg by mouth at bedtime.   Yes [provider]  CINNAMON PO Take by mouth.   Yes [provider]  Cyanocobalamin  (VITAMIN B 12 PO) Take 1,000 mcg by mouth daily.   Yes [provider]  gabapentin  (NEURONTIN ) 600  MG tablet Take 1/2 to 1 tablet 2 to 3 times daily as needed for pain. 11/09/24  Yes Duanne Butler DASEN, MD  levothyroxine  (SYNTHROID ) 50 MCG tablet Take 2 tablets (100 mcg total) by mouth daily before breakfast. Take  1 tablet  Daily  on an empty stomach with only water for 30 minutes & no Antacid meds, Calcium  or Magnesium for 4 hours & avoid Biotin                                                                                            /                                                                   TAKE                                         BY                                                 MOUTH                           ONCE  ?  ?  ?  ?   DAILY 06/10/24  Yes Alvia Corean CROME, FNP  Magnesium 400 MG TABS Take 1 tablet by mouth 2 (two) times daily.   Yes [provider]  rosuvastatin  (CRESTOR ) 20 MG tablet TAKE (1) TABLET BY MOUTH ONCE DAILY FOR CHOLESTEROL. 06/10/24  Yes Alvia Corean CROME, FNP   VITAMIN D  PO Take 5,000 Int'l Units by mouth.   Yes [provider]  doxycycline  (VIBRA -TABS) 100 MG tablet Take 1 tablet (100 mg total) by mouth 2 (two) times daily. 11/17/24   May, Deanna J, NP  ondansetron  (ZOFRAN -ODT) 4 MG disintegrating tablet Take 1 tablet (4 mg total) by mouth every 8 (eight) hours as needed for nausea or vomiting. 10/04/24   Keith, Kayla N, PA-C  pantoprazole  (PROTONIX ) 40 MG tablet Take 1 tablet (40 mg total) by mouth 2 (two) times daily. 05/18/24   Charlanne Groom, MD    Family History Family History  Problem Relation Age of Onset   Arthritis Mother        Living   COPD Mother    Asthma Mother    Hypertension Father        Living   Diabetes Father    Kidney disease Father        Stones   Hyperlipidemia Father    Pancreatic cancer Paternal Grandmother    Other Brother        Deceased   Breast cancer Neg Hx  Colon cancer Neg Hx    Esophageal cancer Neg Hx    Rectal cancer Neg Hx    Stomach cancer Neg Hx    BRCA 1/2 Neg Hx     Social History Social History[1]   Allergies   Penicillins and Sulfa antibiotics   Review of Systems Review of Systems  Per HPI  Physical Exam Triage Vital Signs ED Triage Vitals  Encounter Vitals Group     BP 12/20/24 0903 (!) 153/88     Girls Systolic BP Percentile --      Girls Diastolic BP Percentile --      Boys Systolic BP Percentile --      Boys Diastolic BP Percentile --      Pulse Rate 12/20/24 0903 75     Resp 12/20/24 0903 16     Temp 12/20/24 0903 98.2 F (36.8 C)     Temp Source 12/20/24 0903 Oral     SpO2 12/20/24 0903 97 %     Weight --      Height --      Head Circumference --      Peak Flow --      Pain Score 12/20/24 0901 6     Pain Loc --      Pain Education --      Exclude from Growth Chart --    No data found.  Updated Vital Signs BP (!) 153/88 (BP Location: Right Arm)   Pulse 75   Temp 98.2 F (36.8 C) (Oral)   Resp 16   LMP 03/02/2013   SpO2 97%   Visual  Acuity Right Eye Distance:   Left Eye Distance:   Bilateral Distance:    Right Eye Near:   Left Eye Near:    Bilateral Near:     Physical Exam Vitals and nursing note reviewed.  Constitutional:      Appearance: Normal appearance.  HENT:     Head: Normocephalic and atraumatic.     Right Ear: External ear normal.     Left Ear: External ear normal.     Nose: Nose normal.     Mouth/Throat:     Mouth: Mucous membranes are moist.  Eyes:     Conjunctiva/sclera: Conjunctivae normal.  Cardiovascular:     Rate and Rhythm: Normal rate.  Pulmonary:     Effort: Pulmonary effort is normal. No respiratory distress.  Musculoskeletal:        General: Tenderness present.     Left knee: Swelling present. Decreased range of motion.     Left ankle: Swelling present.       Legs:     Comments: Limited ROM d/t swelling of left knee  L ankle w/ full ROM Brisk capillary refill in all toes and 2+ pedal pulses   Skin:    General: Skin is warm and dry.     Findings: Bruising present.  Neurological:     General: No focal deficit present.     Mental Status: She is alert.  Psychiatric:        Mood and Affect: Mood normal.        Behavior: Behavior is cooperative.      UC Treatments / Results  Labs (all labs ordered are listed, but only abnormal results are displayed) Labs Reviewed - No data to display  EKG   Radiology DG Knee Complete 4 Views Left Result Date: 12/20/2024 CLINICAL DATA:  Left knee injury with pain and bruising. EXAM: LEFT KNEE - COMPLETE 4+  VIEW COMPARISON:  None Available. FINDINGS: No evidence of fracture, dislocation, or joint effusion. No evidence of arthropathy or other focal bone abnormality. Soft tissues are unremarkable. IMPRESSION: Negative. Electronically Signed   By: Norleen DELENA Kil M.D.   On: 12/20/2024 10:36    Procedures Procedures (including critical care time)  Medications Ordered in UC Medications - No data to display  Initial Impression / Assessment  and Plan / UC Course  I have reviewed the triage vital signs and the nursing notes.  Pertinent labs & imaging results that were available during my care of the patient were reviewed by me and considered in my medical decision making (see chart for details).  Vitals and triage reviewed, patient is hemodynamically stable.   Left knee and left lower leg with extensive bruising.  Pocket of swelling along the medial knee with healing laceration over top of it.  Suspect hematoma.  Imaging by my interpretation does not show acute bony abnormality, confirmed with radiology overread.  Symptomatic management for knee injury discussed.  Pain management reviewed.  Orthopedic follow-up encouraged if symptoms persist.  Knee sleeve provided for compression.  Plan of care, follow-up care and return precautions given, no questions at this time.    Final Clinical Impressions(s) / UC Diagnoses   Final diagnoses:  Injury of left knee, initial encounter  Effusion of left knee     Discharge Instructions      Xrays did not show acute bony abnormality  I suspect you have a hematoma underneath your laceration  Ice will help with swelling as well as compression  Tylenol  500 mg every 8 hours as needed for pain  Hematoma / swelling should improve over the next week or two, if issues persist follow-up with orthopedic and they can ultrasound / drain the area if needed.       ED Prescriptions   None    PDMP not reviewed this encounter.     [1]  Social History Tobacco Use   Smoking status: Former    Types: Cigarettes   Smokeless tobacco: Never  Vaping Use   Vaping status: Never Used  Substance Use Topics   Alcohol use: Yes    Comment: occasionally   Drug use: No     Dreama Levert SAILOR, FNP 12/20/24 1059  "

## 2024-12-23 ENCOUNTER — Encounter: Payer: Self-pay | Admitting: Family Medicine

## 2024-12-23 ENCOUNTER — Ambulatory Visit: Payer: Self-pay | Admitting: Family Medicine

## 2024-12-23 ENCOUNTER — Ambulatory Visit: Payer: Self-pay | Admitting: Gastroenterology

## 2024-12-23 VITALS — BP 136/86 | HR 85 | Temp 98.6°F | Ht 64.0 in | Wt 166.4 lb

## 2024-12-23 DIAGNOSIS — E782 Mixed hyperlipidemia: Secondary | ICD-10-CM | POA: Diagnosis not present

## 2024-12-23 DIAGNOSIS — Z0001 Encounter for general adult medical examination with abnormal findings: Secondary | ICD-10-CM

## 2024-12-23 DIAGNOSIS — E039 Hypothyroidism, unspecified: Secondary | ICD-10-CM

## 2024-12-23 DIAGNOSIS — F321 Major depressive disorder, single episode, moderate: Secondary | ICD-10-CM | POA: Diagnosis not present

## 2024-12-23 DIAGNOSIS — E559 Vitamin D deficiency, unspecified: Secondary | ICD-10-CM | POA: Diagnosis not present

## 2024-12-23 DIAGNOSIS — R7303 Prediabetes: Secondary | ICD-10-CM | POA: Diagnosis not present

## 2024-12-23 DIAGNOSIS — Z23 Encounter for immunization: Secondary | ICD-10-CM | POA: Diagnosis not present

## 2024-12-23 DIAGNOSIS — Z0000 Encounter for general adult medical examination without abnormal findings: Secondary | ICD-10-CM

## 2024-12-23 NOTE — Progress Notes (Signed)
 "  Subjective:    Patient ID: Beth Miranda, female    DOB: 1961/10/01, 64 y.o.   MRN: [id number]  Foot Pain   Patient is a very pleasant 64 year old Caucasian female who has history of hypertension as well as hyperlipidemia.  The patient had a coronary artery calcium  score in June.  She scored 85th percentile for age and gender.  She is currently on Crestor .  She also has a history of mixed connective tissue disorder.  At one point she was on Plaquenil and Neurontin .  She had a colonoscopy in 2023 and is due again in 2033.  She had a bone density test in 2021 and is due again in 2026.  She prefers to let her gynecologist schedule her bone density.  Her Pap smear was performed by her gynecologist in 2023.  It appears to be due this year.  Had her mammogram 11/25.  Patient has a history of hypothyroidism and we are due to check a TSH.  She also has a history of prediabetes and we are due to check a hemoglobin A1c. Past Medical History:  Diagnosis Date   Allergy    seasonal   GERD (gastroesophageal reflux disease)    Hyperlipidemia    Labile hypertension    Lyme disease    in the 1990/s   Prediabetes    Raynaud's disease    Thyroid  disease    hypothyroid   Vitamin D  deficiency    Past Surgical History:  Procedure Laterality Date   COLONOSCOPY     FOOT SURGERY  09/28/2020   TENDON REPAIR    TONSILLECTOMY     Current Outpatient Medications on File Prior to Visit  Medication Sig Dispense Refill   aspirin 81 MG tablet Take 81 mg by mouth daily.     buPROPion  ER (WELLBUTRIN  SR) 100 MG 12 hr tablet Take 1 tablet (100 mg total) by mouth daily. 31 tablet 5   Cetirizine HCl (ZYRTEC ALLERGY PO) Take 10 mg by mouth at bedtime.     CINNAMON PO Take by mouth.     Cyanocobalamin  (VITAMIN B 12 PO) Take 1,000 mcg by mouth daily.     doxycycline  (VIBRA -TABS) 100 MG tablet Take 1 tablet (100 mg total) by mouth 2 (two) times daily. 20 tablet 0   gabapentin  (NEURONTIN ) 600 MG tablet Take 1/2 to 1  tablet 2 to 3 times daily as needed for pain. 90 tablet 5   levothyroxine  (SYNTHROID ) 50 MCG tablet Take 2 tablets (100 mcg total) by mouth daily before breakfast. Take  1 tablet  Daily  on an empty stomach with only water for 30 minutes & no Antacid meds, Calcium  or Magnesium for 4 hours & avoid Biotin                                                                                            /  TAKE                                         BY                                                 MOUTH                           ONCE  ?  ?  ?  ?   DAILY 31 tablet 5   Magnesium 400 MG TABS Take 1 tablet by mouth 2 (two) times daily.     ondansetron  (ZOFRAN -ODT) 4 MG disintegrating tablet Take 1 tablet (4 mg total) by mouth every 8 (eight) hours as needed for nausea or vomiting. 20 tablet 0   pantoprazole  (PROTONIX ) 40 MG tablet Take 1 tablet (40 mg total) by mouth 2 (two) times daily. 180 tablet 3   rosuvastatin  (CRESTOR ) 20 MG tablet TAKE (1) TABLET BY MOUTH ONCE DAILY FOR CHOLESTEROL. 30 tablet 11   VITAMIN D  PO Take 5,000 Int'l Units by mouth.     No current facility-administered medications on file prior to visit.   Allergies  Allergen Reactions   Penicillins Hives   Sulfa Antibiotics Hives   Social History   Socioeconomic History   Marital status: Married    Spouse name: Not on file   Number of children: Not on file   Years of education: Not on file   Highest education level: Not on file  Occupational History   Not on file  Tobacco Use   Smoking status: Former    Types: Cigarettes   Smokeless tobacco: Never  Vaping Use   Vaping status: Never Used  Substance and Sexual Activity   Alcohol use: Yes    Comment: occasionally   Drug use: No   Sexual activity: Not Currently    Birth control/protection: Post-menopausal    Comment: 1st intercourse 64 yo-Fewer than 5 partners  Other Topics Concern   Not on file  Social History  Narrative   Lives with husband in a one story home with basement.  No children.     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   Social Drivers of Health   Tobacco Use: Medium Risk (12/20/2024)   Patient History    Smoking Tobacco Use: Former    Smokeless Tobacco Use: Never    Passive Exposure: Not on Actuary Strain: Not on file  Food Insecurity: Not on file  Transportation Needs: Not on file  Physical Activity: Not on file  Stress: Not on file  Social Connections: Not on file  Intimate Partner Violence: Not on file  Depression (PHQ2-9): Medium Risk (07/12/2024)   Depression (PHQ2-9)    PHQ-2 Score: 6  Alcohol Screen: Not on file  Housing: Not on file  Utilities: Not on file  Health Literacy: Not on file      Review of Systems  All other systems reviewed and are negative.      Objective:   Physical Exam Vitals reviewed.  Constitutional:      General: She is not in acute distress.    Appearance: Normal appearance. She is normal weight.  She is not ill-appearing or toxic-appearing.  HENT:     Right Ear: Tympanic membrane and ear canal normal.     Left Ear: Tympanic membrane and ear canal normal.     Nose: Nose normal. No congestion or rhinorrhea.     Mouth/Throat:     Mouth: Mucous membranes are moist.     Pharynx: Oropharynx is clear. No oropharyngeal exudate or posterior oropharyngeal erythema.  Eyes:     Extraocular Movements: Extraocular movements intact.     Conjunctiva/sclera: Conjunctivae normal.     Pupils: Pupils are equal, round, and reactive to light.  Neck:     Vascular: No carotid bruit.  Cardiovascular:     Rate and Rhythm: Normal rate and regular rhythm.     Pulses:          Dorsalis pedis pulses are 2+ on the right side and 2+ on the left side.       Posterior tibial pulses are 2+ on the right side and 2+ on the left side.     Heart sounds: No murmur heard.    No friction rub. No gallop.   Pulmonary:     Effort: Pulmonary effort is normal. No respiratory distress.     Breath sounds: Normal breath sounds. No wheezing or rhonchi.  Abdominal:     General: Bowel sounds are normal. There is no distension.     Palpations: Abdomen is soft.     Tenderness: There is no abdominal tenderness. There is no guarding or rebound.  Musculoskeletal:        General: Swelling, deformity and signs of injury present.     Cervical back: No rigidity.     Right lower leg: No edema.     Left lower leg: No edema.     Right foot: Normal range of motion. No deformity or prominent metatarsal heads.     Left foot: Normal range of motion. No deformity or prominent metatarsal heads.  Feet:     Right foot:     Skin integrity: No ulcer, blister, skin breakdown, erythema or warmth.     Left foot:     Skin integrity: No ulcer, blister, skin breakdown, erythema or warmth.  Lymphadenopathy:     Cervical: No cervical adenopathy.  Skin:    Findings: Bruising and ecchymosis present. No erythema or rash.      Neurological:     General: No focal deficit present.     Mental Status: She is alert and oriented to person, place, and time. Mental status is at baseline.     Cranial Nerves: No cranial nerve deficit.     Sensory: No sensory deficit.     Motor: No weakness.     Coordination: Coordination normal.  Psychiatric:        Mood and Affect: Mood normal.        Behavior: Behavior normal.        Thought Content: Thought content normal.        Judgment: Judgment normal.    Patient has a large bruise from her left knee down her left medial shin all the way to her medial left ankle.  About a week ago she dropped firewood on her leg while splitting firewood.  She denies any pain with range of motion in the knee.  She is able to walk without any instability or laxity.  She denies any pain in her legs today.  The bruising is slowly fading       Assessment &  Plan:  General medical exam  Hyperlipidemia,  mixed - Plan: CBC with Differential/Platelet, Comprehensive metabolic panel with GFR, Lipid panel  Hypothyroidism, unspecified type - Plan: TSH  Major depressive disorder, single episode, moderate with anxious distress (HCC)  Vitamin D  deficiency - Plan: VITAMIN D  25 Hydroxy (Vit-D Deficiency, Fractures)  Prediabetes - Plan: Hemoglobin A1c  Flu vaccine need - Plan: Flu vaccine trivalent PF, 6mos and older(Flulaval,Afluria,Fluarix,Fluzone) Physical exam is normal today except for the bruising.  I will check CBC CMP and a lipid panel.  Given her elevated coronary artery calcium  score I would like her LDL cholesterol to be less than 70.  I will check a TSH to ensure adequate dosage of her levothyroxine .  Given her history of vitamin D  deficiency I will check this as well.  She is currently taking 10,000 units a day of vitamin D .  Given her history of prediabetes I will check a hemoglobin A1c.  Ideally I would like this to be less than 6.5.  The patient received her flu shot today.  I do not feel that she requires the RSV vaccination.  I will defer her Pap smear and her bone density test to her gynecologist.  Her mammogram and colonoscopy are up-to-date.  "

## 2024-12-24 LAB — COMPREHENSIVE METABOLIC PANEL WITH GFR
AG Ratio: 1.5 (calc) (ref 1.0–2.5)
ALT: 15 U/L (ref 6–29)
AST: 21 U/L (ref 10–35)
Albumin: 4.3 g/dL (ref 3.6–5.1)
Alkaline phosphatase (APISO): 57 U/L (ref 37–153)
BUN: 17 mg/dL (ref 7–25)
CO2: 31 mmol/L (ref 20–32)
Calcium: 9.6 mg/dL (ref 8.6–10.4)
Chloride: 104 mmol/L (ref 98–110)
Creat: 0.86 mg/dL (ref 0.50–1.05)
Globulin: 2.9 g/dL (ref 1.9–3.7)
Glucose, Bld: 83 mg/dL (ref 65–99)
Potassium: 4.2 mmol/L (ref 3.5–5.3)
Sodium: 143 mmol/L (ref 135–146)
Total Bilirubin: 0.4 mg/dL (ref 0.2–1.2)
Total Protein: 7.2 g/dL (ref 6.1–8.1)
eGFR: 76 mL/min/1.73m2

## 2024-12-24 LAB — TSH: TSH: 3.12 m[IU]/L (ref 0.40–4.50)

## 2024-12-24 LAB — CBC WITH DIFFERENTIAL/PLATELET
Absolute Lymphocytes: 1498 {cells}/uL (ref 850–3900)
Absolute Monocytes: 603 {cells}/uL (ref 200–950)
Basophils Absolute: 52 {cells}/uL (ref 0–200)
Basophils Relative: 1 %
Eosinophils Absolute: 213 {cells}/uL (ref 15–500)
Eosinophils Relative: 4.1 %
HCT: 41.3 % (ref 35.9–46.0)
Hemoglobin: 13.3 g/dL (ref 11.7–15.5)
MCH: 28.4 pg (ref 27.0–33.0)
MCHC: 32.2 g/dL (ref 31.6–35.4)
MCV: 88.1 fL (ref 81.4–101.7)
MPV: 10 fL (ref 7.5–12.5)
Monocytes Relative: 11.6 %
Neutro Abs: 2834 {cells}/uL (ref 1500–7800)
Neutrophils Relative %: 54.5 %
Platelets: 274 Thousand/uL (ref 140–400)
RBC: 4.69 Million/uL (ref 3.80–5.10)
RDW: 12.9 % (ref 11.0–15.0)
Total Lymphocyte: 28.8 %
WBC: 5.2 Thousand/uL (ref 3.8–10.8)

## 2024-12-24 LAB — LIPID PANEL
Cholesterol: 153 mg/dL
HDL: 83 mg/dL
LDL Cholesterol (Calc): 56 mg/dL
Non-HDL Cholesterol (Calc): 70 mg/dL
Total CHOL/HDL Ratio: 1.8 (calc)
Triglycerides: 68 mg/dL

## 2024-12-24 LAB — HEMOGLOBIN A1C
Hgb A1c MFr Bld: 5.6 %
Mean Plasma Glucose: 114 mg/dL
eAG (mmol/L): 6.3 mmol/L

## 2024-12-24 LAB — VITAMIN D 25 HYDROXY (VIT D DEFICIENCY, FRACTURES): Vit D, 25-Hydroxy: 79 ng/mL (ref 30–100)

## 2024-12-26 ENCOUNTER — Ambulatory Visit: Payer: Self-pay | Admitting: Family Medicine

## 2024-12-29 ENCOUNTER — Telehealth: Payer: Self-pay | Admitting: Podiatry

## 2024-12-29 NOTE — Telephone Encounter (Signed)
 ERROR

## 2025-01-10 ENCOUNTER — Encounter: Payer: Self-pay | Admitting: Gastroenterology

## 2025-01-10 ENCOUNTER — Ambulatory Visit: Admitting: Gastroenterology

## 2025-01-10 VITALS — BP 130/72 | HR 91 | Ht 64.0 in | Wt 170.4 lb

## 2025-01-10 DIAGNOSIS — K5909 Other constipation: Secondary | ICD-10-CM

## 2025-01-10 DIAGNOSIS — K5730 Diverticulosis of large intestine without perforation or abscess without bleeding: Secondary | ICD-10-CM

## 2025-01-10 DIAGNOSIS — K219 Gastro-esophageal reflux disease without esophagitis: Secondary | ICD-10-CM | POA: Diagnosis not present

## 2025-01-10 DIAGNOSIS — K449 Diaphragmatic hernia without obstruction or gangrene: Secondary | ICD-10-CM

## 2025-01-10 NOTE — Patient Instructions (Signed)
 Take Miralax 17gm every day  High Fiber Diet  Avoid NSAIDS  _______________________________________________________  If your blood pressure at your visit was 140/90 or greater, please contact your primary care physician to follow up on this.  _______________________________________________________  If you are age 63 or older, your body mass index should be between 23-30. Your Body mass index is 29.24 kg/m. If this is out of the aforementioned range listed, please consider follow up with your Primary Care Provider.  If you are age 8 or younger, your body mass index should be between 19-25. Your Body mass index is 29.24 kg/m. If this is out of the aformentioned range listed, please consider follow up with your Primary Care Provider.   ________________________________________________________  The [facility] providers would like to encourage you to use MYCHART to communicate with providers for non-urgent requests or questions.  Due to long hold times on the telephone, sending your provider a message by [facility] may be a faster and more efficient way to get a response.  Please allow 48 business hours for a response.  Please remember that this is for non-urgent requests.  _______________________________________________________  [facility] is using a team-based approach to care.  Your team is made up of your doctor and two to three APPS. Our APPS (Nurse Practitioners and Physician Assistants) work with your physician to ensure care continuity for you. They are fully qualified to address your health concerns and develop a treatment plan. They communicate directly with your gastroenterologist to care for you. Seeing the Advanced Practice Practitioners on your physician's team can help you by facilitating care more promptly, often allowing for earlier appointments, access to diagnostic testing, procedures, and other specialty referrals.

## 2025-01-10 NOTE — Progress Notes (Signed)
 "   Chief Complaint: FU  Referring Provider:  Duanne Butler DASEN, MD      ASSESSMENT AND PLAN;   #1. Ascending diverticulitis (resolved, last Doxy 12/15). Last colon 6/23 and normal with exception of diverticular disease and hemorrhoids, recall 10 years.   #2. GERD with small HH   Plan: -Miralax 17g po every day -High fiber diet -Avoid NSAIDs -Continue probitoics x 3 months.  Then can stop. -Continue Protonix  40 mg p.o. daily -Rpt colon due 05/2032.  Earlier, if with any new problems. -FU as needed.   HPI:    Beth Miranda is a 64 y.o. female  64  year old female patient with past medical history of Lyme's disease, thyroid  disease, GERD, hypertension, and GAD for FU ascending colon diverticulitis, Dx on CT October 2025, treated with 2 courses of Cipro /Flagyl  followed by Doxy 100  twice daily x 10 days   Had complete resolution with Doxy.  Last antibiotic 11/28/2024  History of Present Illness Beth Miranda is a 64 year old female with diverticulitis of the ascending colon who presents for follow-up and management.  A severe episode of diverticulitis localized to the ascending colon began suddenly, with pain described as the worst she has ever felt and causing inability to stand upright. No fever or chills occurred during the episode. Initial treatment included seven days of ciprofloxacin  and metronidazole , followed by ten days at a lower dose. Doxycycline  was subsequently prescribed and was the most effective and best tolerated. Ciprofloxacin  caused malaise, and metronidazole  resulted in dysgeusia. Last dose of doxycycline  was around November 28, 2024. Since completing antibiotics, she has taken daily probiotics (60 billion CFUs) as advised.  Current bowel regimen includes daily MiraLAX, which maintains regularity. No diarrhea or constipation. Diet consists of shredded wheat, vegetables, beans, and raw fruits. Seeds, nuts, and popcorn are avoided due to diverticulitis  concerns, though she enjoys these foods. Occasional mild twinges occur in the area of prior diverticulitis, but no persistent or severe pain. No fever, chills, or prolonged symptoms since the initial episode. Mobility has been limited by foot pain in anticipation of upcoming surgery.  CT scan during the episode showed a small (3 mm) non-obstructing left renal stone, congenital L5 defect, and diverticulosis. Liver, pancreas, adrenal glands, appendix, uterus, and adnexa were normal. Hematuria occurred three months ago but is not present currently. Daily baby aspirin is taken; NSAIDs are avoided due to gastrointestinal concerns.  No nausea, vomiting, heartburn, regurgitation, odynophagia or dysphagia.  No significant diarrhea or constipation.  No melena or hematochezia. No unintentional weight loss. No abdominal pain.  No fever or chills.   Past GI [facility] Colon 05/2022: - Mild predominantly sigmoid diverticulosis - Non- bleeding internal hemorrhoids. - The examined portion of the ileum was normal. - The examination was otherwise normal on direct and retroflexion views. Rpt 10 yrs  CT AP with contrast 09/2024 Changes of diverticulitis involving the ascending colon adjacent to the cecum. No perforation or abscess is noted. The appendix is within normal limits. 3 mm nonobstructing left renal stone. 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  EGD 12/2022 - Mildly torturous esophagus. - 2 cm hiatal hernia. Past Medical History:  Diagnosis Date   Allergy    seasonal   Diverticulitis    GERD (gastroesophageal reflux disease)    Hyperlipidemia    Labile hypertension    Lyme disease    in the 1990/s   Prediabetes    Raynaud's disease    Thyroid  disease  hypothyroid   Vitamin D  deficiency     Past Surgical History:  Procedure Laterality Date   COLONOSCOPY     FOOT SURGERY  09/28/2020   TENDON REPAIR    TONSILLECTOMY      Family History  Problem Relation Age of Onset   Arthritis  Mother        Living   COPD Mother    Asthma Mother    Hypertension Father        Living   Diabetes Father    Kidney disease Father        Stones   Hyperlipidemia Father    Pancreatic cancer Paternal Grandmother    Other Brother        Deceased   Breast cancer Neg Hx    Colon cancer Neg Hx    Esophageal cancer Neg Hx    Rectal cancer Neg Hx    Stomach cancer Neg Hx    BRCA 1/2 Neg Hx     Social History[1]  Current Outpatient Medications  Medication Sig Dispense Refill   aspirin 81 MG tablet Take 81 mg by mouth daily.     buPROPion  ER (WELLBUTRIN  SR) 100 MG 12 hr tablet Take 1 tablet (100 mg total) by mouth daily. 31 tablet 5   Cetirizine HCl (ZYRTEC ALLERGY PO) Take 10 mg by mouth at bedtime.     CINNAMON PO Take by mouth.     Cyanocobalamin  (VITAMIN B 12 PO) Take 1,000 mcg by mouth daily.     gabapentin  (NEURONTIN ) 600 MG tablet Take 1/2 to 1 tablet 2 to 3 times daily as needed for pain. 90 tablet 5   levothyroxine  (SYNTHROID ) 50 MCG tablet Take 2 tablets (100 mcg total) by mouth daily before breakfast. Take  1 tablet  Daily  on an empty stomach with only water for 30 minutes & no Antacid meds, Calcium  or Magnesium for 4 hours & avoid Biotin                                                                                            /                                                                   TAKE                                         BY                                                 MOUTH  ONCE  ?  ?  ?  ?   DAILY 31 tablet 5   Magnesium 400 MG TABS Take 1 tablet by mouth 2 (two) times daily.     pantoprazole  (PROTONIX ) 40 MG tablet Take 1 tablet (40 mg total) by mouth 2 (two) times daily. 180 tablet 3   rosuvastatin  (CRESTOR ) 20 MG tablet TAKE (1) TABLET BY MOUTH ONCE DAILY FOR CHOLESTEROL. 30 tablet 11   VITAMIN D  PO Take 5,000 Int'l Units by mouth.     No current facility-administered medications for this visit.    Allergies[2]  Review  of Systems:  neg     Physical Exam:    BP 130/72   Pulse 91   Ht 5' 4 (1.626 m)   Wt 170 lb 6 oz (77.3 kg)   LMP 03/02/2013   BMI 29.24 kg/m  Wt Readings from Last 3 Encounters:  01/10/25 170 lb 6 oz (77.3 kg)  12/23/24 166 lb 6.4 oz (75.5 kg)  11/17/24 166 lb 4 oz (75.4 kg)   Constitutional:  Well-developed, in no acute distress. Psychiatric: Normal mood and affect. Behavior is normal. HEENT: Pupils normal.  Conjunctivae are normal. No scleral icterus. Cardiovascular: Normal rate, regular rhythm. No edema Pulmonary/chest: Effort normal and breath sounds normal. No wheezing, rales or rhonchi. Abdominal: Soft, nondistended. Nontender.  Minimal tenderness on deep palpation in the right side.  Bowel sounds active throughout. There are no masses palpable. No hepatomegaly. Rectal: Deferred Neurological: Alert and oriented to person place and time. Skin: Skin is warm and dry. No rashes noted.  Data Reviewed: I have personally reviewed following labs and imaging studies  CBC:    Latest Ref Rng & Units 12/23/2024    2:30 PM 10/03/2024    7:08 PM 10/03/2024    5:50 PM  CBC  WBC 3.8 - 10.8 Thousand/uL 5.2   7.5   Hemoglobin 11.7 - 15.5 g/dL 86.6  85.3  86.0   Hematocrit 35.9 - 46.0 % 41.3  43.0  44.5   Platelets 140 - 400 Thousand/uL 274   252     CMP:    Latest Ref Rng & Units 12/23/2024    2:30 PM 10/03/2024    7:08 PM 10/03/2024    5:50 PM  CMP  Glucose 65 - 99 mg/dL 83  87  899   BUN 7 - 25 mg/dL 17  14  11    Creatinine 0.50 - 1.05 mg/dL 9.13  9.09  9.09   Sodium 135 - 146 mmol/L 143  142  140   Potassium 3.5 - 5.3 mmol/L 4.2  4.3  4.2   Chloride 98 - 110 mmol/L 104  103  104   CO2 20 - 32 mmol/L 31   27   Calcium  8.6 - 10.4 mg/dL 9.6   9.1   Total Protein 6.1 - 8.1 g/dL 7.2   7.3   Total Bilirubin 0.2 - 1.2 mg/dL 0.4   0.8   Alkaline Phos 38 - 126 U/L   65   AST 10 - 35 U/L 21   21   ALT 6 - 29 U/L 15   17         Anselm Bring, MD 01/10/2025, 1:59 PM  Cc:  Duanne Butler DASEN, MD      [1]  Social History Tobacco Use   Smoking status: Former    Types: Cigarettes   Smokeless tobacco: Never  Vaping Use   Vaping status: Never Used  Substance Use Topics  Alcohol use: Yes    Comment: occasionally   Drug use: No  [2]  Allergies Allergen Reactions   Penicillins Hives   Sulfa Antibiotics Hives   "

## 2025-01-12 ENCOUNTER — Telehealth: Payer: Self-pay | Admitting: Podiatry

## 2025-01-12 NOTE — Telephone Encounter (Signed)
 DOS- 02/03/2025  3RD NEURECTOMY LT- 28080  [employer] EFFECTIVE DATE- 10/15/2024  DEDUCTIBLE- $5000 REMAINING- $4884.54 OOP- $9200 REMAINING- $1317.45 COINSURANCE- 30%  PER BCBS PORTAL, PRIOR AUTH IS NOT REQUIRED FOR CPT CODES 71919.

## 2025-02-09 ENCOUNTER — Encounter: Admitting: Podiatry

## 2025-02-09 ENCOUNTER — Encounter

## 2025-02-23 ENCOUNTER — Encounter

## 2025-02-23 ENCOUNTER — Encounter: Admitting: Podiatry

## 2025-03-16 ENCOUNTER — Other Ambulatory Visit (HOSPITAL_BASED_OUTPATIENT_CLINIC_OR_DEPARTMENT_OTHER): Payer: Self-pay

## 2025-03-16 ENCOUNTER — Encounter

## 2025-03-16 ENCOUNTER — Encounter: Admitting: Podiatry

## 2025-07-13 ENCOUNTER — Ambulatory Visit: Admitting: Obstetrics and Gynecology
